# Patient Record
Sex: Male | Born: 1960 | Race: Black or African American | Hispanic: No | Marital: Married | Smoking: Never smoker
Health system: Southern US, Community
[De-identification: ages and names within clinical notes are randomized; demographics above are authoritative.]

## PROBLEM LIST (undated history)

## (undated) DIAGNOSIS — I10 Essential (primary) hypertension: Secondary | ICD-10-CM

## (undated) DIAGNOSIS — D72829 Elevated white blood cell count, unspecified: Secondary | ICD-10-CM

## (undated) DIAGNOSIS — I639 Cerebral infarction, unspecified: Secondary | ICD-10-CM

## (undated) DIAGNOSIS — I471 Supraventricular tachycardia, unspecified: Secondary | ICD-10-CM

## (undated) DIAGNOSIS — R7303 Prediabetes: Secondary | ICD-10-CM

## (undated) DIAGNOSIS — E785 Hyperlipidemia, unspecified: Secondary | ICD-10-CM

## (undated) DIAGNOSIS — Y249XXA Unspecified firearm discharge, undetermined intent, initial encounter: Secondary | ICD-10-CM

## (undated) DIAGNOSIS — G459 Transient cerebral ischemic attack, unspecified: Secondary | ICD-10-CM

## (undated) DIAGNOSIS — I619 Nontraumatic intracerebral hemorrhage, unspecified: Secondary | ICD-10-CM

## (undated) DIAGNOSIS — D649 Anemia, unspecified: Secondary | ICD-10-CM

## (undated) DIAGNOSIS — R519 Headache, unspecified: Secondary | ICD-10-CM

## (undated) DIAGNOSIS — M67439 Ganglion, unspecified wrist: Secondary | ICD-10-CM

## (undated) HISTORY — PX: JOINT REPLACEMENT: SHX530

## (undated) HISTORY — DX: Essential (primary) hypertension: I10

## (undated) HISTORY — PX: HERNIA REPAIR: SHX51

## (undated) HISTORY — DX: Cerebral infarction, unspecified: I63.9

## (undated) HISTORY — PX: OTHER SURGICAL HISTORY: SHX169

---

## 1994-06-13 ENCOUNTER — Emergency Department: Admit: 1994-06-13 | Payer: Self-pay | Admitting: Emergency Medical Services

## 2001-02-26 ENCOUNTER — Emergency Department (HOSPITAL_COMMUNITY): Admission: EM | Admit: 2001-02-26 | Discharge: 2001-02-26 | Payer: Self-pay | Admitting: *Deleted

## 2005-11-11 ENCOUNTER — Ambulatory Visit: Payer: Self-pay | Admitting: Family Medicine

## 2006-01-28 ENCOUNTER — Ambulatory Visit: Payer: Self-pay | Admitting: Family Medicine

## 2006-02-25 ENCOUNTER — Ambulatory Visit (HOSPITAL_COMMUNITY): Admission: RE | Admit: 2006-02-25 | Discharge: 2006-02-25 | Payer: Self-pay | Admitting: *Deleted

## 2015-10-09 ENCOUNTER — Ambulatory Visit (INDEPENDENT_AMBULATORY_CARE_PROVIDER_SITE_OTHER): Payer: Medicaid Other | Admitting: Podiatry

## 2015-10-09 ENCOUNTER — Encounter: Payer: Self-pay | Admitting: Podiatry

## 2015-10-09 VITALS — BP 142/102 | HR 82 | Resp 18

## 2015-10-09 DIAGNOSIS — L600 Ingrowing nail: Secondary | ICD-10-CM | POA: Diagnosis not present

## 2015-10-09 DIAGNOSIS — B351 Tinea unguium: Secondary | ICD-10-CM | POA: Diagnosis not present

## 2015-10-09 DIAGNOSIS — M79676 Pain in unspecified toe(s): Secondary | ICD-10-CM

## 2015-10-09 NOTE — Progress Notes (Signed)
   Subjective:    Patient ID: Sherley BoundsJoseph B Onstott, male    DOB: 01/17/1961, 55 y.o.   MRN: [id number]  HPI  55 year old male presents the office for concerns of thick, painful, elongated toenails that he cannot trim himself. Denies any swelling or redness or any drainage or pus. He is unsure of his last A1c or his last blood sugar. Denies any claudication symptoms. No numbness or tingling. No other complaints.   Review of Systems  All other systems reviewed and are negative.      Objective:   Physical Exam General: AAO x3, NAD  Dermatological: Nails are hypertrophic, dystrophic, brittle, discolored, elongated 10. There is no swelling erythema or drainage. There is incurvation of the nails. Tenderness to nails 1-5 bilaterally.  Vascular: Dorsalis Pedis artery and Posterior Tibial artery pedal pulses are 2/4 bilateral with immedate capillary fill time. Pedal hair growth present. No varicosities and no lower extremity edema present bilateral. There is no pain with calf compression, swelling, warmth, erythema.   Neruologic: Grossly intact via light touch bilateral. Vibratory intact via tuning fork bilateral. Protective threshold with Semmes Wienstein monofilament intact to all pedal sites bilateral. Patellar and Achilles deep tendon reflexes 2+ bilateral. No Babinski or clonus noted bilateral.   Musculoskeletal: Hammertoes are present. No pain, crepitus, or limitation noted with foot and ankle range of motion bilateral. Muscular strength 5/5 in all groups tested bilateral.  Gait: Unassisted, Nonantalgic.      Assessment & Plan:  55 year old male symptomatic onychomycosis -Treatment options discussed including all alternatives, risks, and complications -Etiology of symptoms were discussed -Nails debrided 10 without complications or bleeding -Discussed to the foot inspection -Follow-up in 3 months or sooner if any problems arise. In the meantime, encouraged to call the office with any  questions, concerns, change in symptoms.   Ovid CurdMatthew Wagoner, DPM

## 2016-01-13 ENCOUNTER — Ambulatory Visit: Payer: Medicaid Other | Admitting: Podiatry

## 2018-11-21 DIAGNOSIS — M1612 Unilateral primary osteoarthritis, left hip: Secondary | ICD-10-CM | POA: Insufficient documentation

## 2018-11-30 DIAGNOSIS — Z96642 Presence of left artificial hip joint: Secondary | ICD-10-CM | POA: Insufficient documentation

## 2020-02-02 ENCOUNTER — Observation Stay (HOSPITAL_COMMUNITY): Payer: Medicaid Other

## 2020-02-02 ENCOUNTER — Other Ambulatory Visit: Payer: Self-pay

## 2020-02-02 ENCOUNTER — Emergency Department (HOSPITAL_COMMUNITY): Payer: Medicaid Other

## 2020-02-02 ENCOUNTER — Encounter (HOSPITAL_COMMUNITY): Payer: Self-pay | Admitting: Emergency Medicine

## 2020-02-02 ENCOUNTER — Observation Stay (HOSPITAL_COMMUNITY)
Admission: EM | Admit: 2020-02-02 | Discharge: 2020-02-04 | Disposition: A | Discharge status: Home / Self Care | Payer: Medicaid Other | Attending: Internal Medicine | Admitting: Internal Medicine

## 2020-02-02 DIAGNOSIS — G459 Transient cerebral ischemic attack, unspecified: Secondary | ICD-10-CM

## 2020-02-02 DIAGNOSIS — R7303 Prediabetes: Secondary | ICD-10-CM | POA: Diagnosis not present

## 2020-02-02 DIAGNOSIS — N182 Chronic kidney disease, stage 2 (mild): Secondary | ICD-10-CM | POA: Insufficient documentation

## 2020-02-02 DIAGNOSIS — H5347 Heteronymous bilateral field defects: Secondary | ICD-10-CM

## 2020-02-02 DIAGNOSIS — Z7984 Long term (current) use of oral hypoglycemic drugs: Secondary | ICD-10-CM | POA: Insufficient documentation

## 2020-02-02 DIAGNOSIS — I471 Supraventricular tachycardia: Secondary | ICD-10-CM | POA: Insufficient documentation

## 2020-02-02 DIAGNOSIS — M719 Bursopathy, unspecified: Secondary | ICD-10-CM | POA: Insufficient documentation

## 2020-02-02 DIAGNOSIS — E119 Type 2 diabetes mellitus without complications: Secondary | ICD-10-CM | POA: Insufficient documentation

## 2020-02-02 DIAGNOSIS — I619 Nontraumatic intracerebral hemorrhage, unspecified: Secondary | ICD-10-CM | POA: Insufficient documentation

## 2020-02-02 DIAGNOSIS — Z20822 Contact with and (suspected) exposure to covid-19: Secondary | ICD-10-CM | POA: Diagnosis not present

## 2020-02-02 DIAGNOSIS — D649 Anemia, unspecified: Secondary | ICD-10-CM | POA: Insufficient documentation

## 2020-02-02 DIAGNOSIS — Z79899 Other long term (current) drug therapy: Secondary | ICD-10-CM | POA: Diagnosis not present

## 2020-02-02 DIAGNOSIS — I129 Hypertensive chronic kidney disease with stage 1 through stage 4 chronic kidney disease, or unspecified chronic kidney disease: Secondary | ICD-10-CM | POA: Insufficient documentation

## 2020-02-02 DIAGNOSIS — I639 Cerebral infarction, unspecified: Secondary | ICD-10-CM | POA: Diagnosis not present

## 2020-02-02 DIAGNOSIS — R531 Weakness: Secondary | ICD-10-CM | POA: Diagnosis present

## 2020-02-02 DIAGNOSIS — E785 Hyperlipidemia, unspecified: Secondary | ICD-10-CM

## 2020-02-02 LAB — I-STAT CHEM 8, ED
BUN: 19 mg/dL (ref 6–20)
Calcium, Ion: 1.04 mmol/L — ABNORMAL LOW (ref 1.15–1.40)
Chloride: 104 mmol/L (ref 98–111)
Creatinine, Ser: 1.3 mg/dL — ABNORMAL HIGH (ref 0.61–1.24)
Glucose, Bld: 129 mg/dL — ABNORMAL HIGH (ref 70–99)
HCT: 46 % (ref 39.0–52.0)
Hemoglobin: 15.6 g/dL (ref 13.0–17.0)
Potassium: 3 mmol/L — ABNORMAL LOW (ref 3.5–5.1)
Sodium: 140 mmol/L (ref 135–145)
TCO2: 22 mmol/L (ref 22–32)

## 2020-02-02 LAB — DIFFERENTIAL
Abs Immature Granulocytes: 0.03 10*3/uL (ref 0.00–0.07)
Basophils Absolute: 0 10*3/uL (ref 0.0–0.1)
Basophils Relative: 0 %
Eosinophils Absolute: 0.1 10*3/uL (ref 0.0–0.5)
Eosinophils Relative: 1 %
Immature Granulocytes: 0 %
Lymphocytes Relative: 36 %
Lymphs Abs: 3.6 10*3/uL (ref 0.7–4.0)
Monocytes Absolute: 0.7 10*3/uL (ref 0.1–1.0)
Monocytes Relative: 7 %
Neutro Abs: 5.6 10*3/uL (ref 1.7–7.7)
Neutrophils Relative %: 56 %

## 2020-02-02 LAB — COMPREHENSIVE METABOLIC PANEL
ALT: 20 U/L (ref 0–44)
AST: 22 U/L (ref 15–41)
Albumin: 4.1 g/dL (ref 3.5–5.0)
Alkaline Phosphatase: 70 U/L (ref 38–126)
Anion gap: 11 (ref 5–15)
BUN: 18 mg/dL (ref 6–20)
CO2: 24 mmol/L (ref 22–32)
Calcium: 9.2 mg/dL (ref 8.9–10.3)
Chloride: 103 mmol/L (ref 98–111)
Creatinine, Ser: 1.41 mg/dL — ABNORMAL HIGH (ref 0.61–1.24)
GFR calc Af Amer: >60 mL/min (ref >60)
GFR calc non Af Amer: 55 mL/min — ABNORMAL LOW (ref >60)
Glucose, Bld: 134 mg/dL — ABNORMAL HIGH (ref 70–99)
Potassium: 3.1 mmol/L — ABNORMAL LOW (ref 3.5–5.1)
Sodium: 138 mmol/L (ref 135–145)
Total Bilirubin: 0.5 mg/dL (ref 0.3–1.2)
Total Protein: 7.2 g/dL (ref 6.5–8.1)

## 2020-02-02 LAB — CBC
HCT: 45.9 % (ref 39.0–52.0)
Hemoglobin: 14.7 g/dL (ref 13.0–17.0)
MCH: 28.2 pg (ref 26.0–34.0)
MCHC: 32 g/dL (ref 30.0–36.0)
MCV: 87.9 fL (ref 80.0–100.0)
Platelets: 331 10*3/uL (ref 150–400)
RBC: 5.22 MIL/uL (ref 4.22–5.81)
RDW: 12.3 % (ref 11.5–15.5)
WBC: 10 10*3/uL (ref 4.0–10.5)
nRBC: 0 % (ref 0.0–0.2)

## 2020-02-02 LAB — GLUCOSE, CAPILLARY
Glucose-Capillary: 96 mg/dL (ref 70–99)
Glucose-Capillary: 99 mg/dL (ref 70–99)

## 2020-02-02 LAB — CBG MONITORING, ED: Glucose-Capillary: 107 mg/dL — ABNORMAL HIGH (ref 70–99)

## 2020-02-02 LAB — PROTIME-INR
INR: 1 (ref 0.8–1.2)
Prothrombin Time: 12.6 seconds (ref 11.4–15.2)

## 2020-02-02 LAB — APTT: aPTT: 26 seconds (ref 24–36)

## 2020-02-02 LAB — SARS CORONAVIRUS 2 BY RT PCR (HOSPITAL ORDER, PERFORMED IN ~~LOC~~ HOSPITAL LAB): SARS Coronavirus 2: NEGATIVE

## 2020-02-02 MED ORDER — STROKE: EARLY STAGES OF RECOVERY BOOK
Freq: Once | Status: AC
  Filled 2020-02-02: qty 1

## 2020-02-02 MED ORDER — AMLODIPINE BESYLATE 5 MG PO TABS: 10.0000 mg | ORAL_TABLET | Freq: Every day | ORAL | Status: DC

## 2020-02-02 MED ORDER — ACETAMINOPHEN 160 MG/5ML PO SOLN: 650.0000 mg | ORAL | Status: DC | PRN

## 2020-02-02 MED ORDER — ACETAMINOPHEN 650 MG RE SUPP: 650.0000 mg | RECTAL | Status: DC | PRN

## 2020-02-02 MED ORDER — HYDRALAZINE HCL 25 MG PO TABS
25.0000 mg | ORAL_TABLET | Freq: Four times a day (QID) | ORAL | Status: DC | PRN
  Filled 2020-02-02: qty 1

## 2020-02-02 MED ORDER — IOHEXOL 350 MG/ML SOLN
75.0000 mL | Freq: Once | INTRAVENOUS | Status: AC | PRN
  Administered 2020-02-02: 75 mL via INTRAVENOUS

## 2020-02-02 MED ORDER — FAMOTIDINE 20 MG PO TABS
20.0000 mg | ORAL_TABLET | Freq: Every day | ORAL | Status: DC
  Administered 2020-02-03 – 2020-02-04 (×2): 20 mg via ORAL
  Filled 2020-02-02 (×4): qty 1

## 2020-02-02 MED ORDER — INSULIN ASPART 100 UNIT/ML ~~LOC~~ SOLN: 0.0000 [IU] | Freq: Three times a day (TID) | SUBCUTANEOUS | Status: DC

## 2020-02-02 MED ORDER — AMLODIPINE BESYLATE 10 MG PO TABS
10.0000 mg | ORAL_TABLET | Freq: Every day | ORAL | Status: DC
  Administered 2020-02-03 – 2020-02-04 (×2): 10 mg via ORAL
  Filled 2020-02-02 (×2): qty 1

## 2020-02-02 MED ORDER — SODIUM CHLORIDE 0.9% FLUSH
3.0000 mL | Freq: Once | INTRAVENOUS | Status: AC
  Administered 2020-02-02: 3 mL via INTRAVENOUS

## 2020-02-02 MED ORDER — PANTOPRAZOLE SODIUM 40 MG PO TBEC
40.0000 mg | DELAYED_RELEASE_TABLET | Freq: Every day | ORAL | Status: DC
  Administered 2020-02-02 – 2020-02-04 (×3): 40 mg via ORAL
  Filled 2020-02-02 (×3): qty 1

## 2020-02-02 MED ORDER — ASPIRIN EC 81 MG PO TBEC
81.0000 mg | DELAYED_RELEASE_TABLET | Freq: Every day | ORAL | Status: DC
  Administered 2020-02-02 – 2020-02-04 (×3): 81 mg via ORAL
  Filled 2020-02-02 (×3): qty 1

## 2020-02-02 MED ORDER — HYDROCHLOROTHIAZIDE 25 MG PO TABS: 25.0000 mg | ORAL_TABLET | Freq: Every day | ORAL | Status: DC

## 2020-02-02 MED ORDER — SENNOSIDES-DOCUSATE SODIUM 8.6-50 MG PO TABS: 1.0000 | ORAL_TABLET | Freq: Every evening | ORAL | Status: DC | PRN

## 2020-02-02 MED ORDER — ATORVASTATIN CALCIUM 40 MG PO TABS
40.0000 mg | ORAL_TABLET | Freq: Every day | ORAL | Status: DC
  Administered 2020-02-02 – 2020-02-03 (×2): 40 mg via ORAL
  Filled 2020-02-02 (×2): qty 1

## 2020-02-02 MED ORDER — ACETAMINOPHEN 325 MG PO TABS
650.0000 mg | ORAL_TABLET | ORAL | Status: DC | PRN
  Administered 2020-02-03: 650 mg via ORAL
  Filled 2020-02-02: qty 2

## 2020-02-02 MED ORDER — LUBIPROSTONE 24 MCG PO CAPS: 24.0000 ug | ORAL_CAPSULE | Freq: Every day | ORAL | Status: DC

## 2020-02-02 MED ORDER — POTASSIUM CHLORIDE CRYS ER 20 MEQ PO TBCR
40.0000 meq | EXTENDED_RELEASE_TABLET | Freq: Once | ORAL | Status: AC
  Administered 2020-02-02: 40 meq via ORAL
  Filled 2020-02-02: qty 2

## 2020-02-02 MED ORDER — MINOXIDIL 2.5 MG PO TABS: 2.5000 mg | ORAL_TABLET | Freq: Every day | ORAL | Status: DC

## 2020-02-02 MED ORDER — ENOXAPARIN SODIUM 40 MG/0.4ML ~~LOC~~ SOLN
40.0000 mg | SUBCUTANEOUS | Status: DC
  Administered 2020-02-04: 40 mg via SUBCUTANEOUS
  Filled 2020-02-02 (×2): qty 0.4

## 2020-02-02 MED ORDER — CAPTOPRIL 100 MG PO TABS: 100.0000 mg | ORAL_TABLET | Freq: Two times a day (BID) | ORAL | Status: DC

## 2020-02-02 MED ORDER — LORAZEPAM 0.5 MG PO TABS: 0.5000 mg | ORAL_TABLET | Freq: Four times a day (QID) | ORAL | Status: DC | PRN

## 2020-02-02 NOTE — Progress Notes (Signed)
Telemetry reported heart rhythm 3rd degree compltete block, .34 sec PR interval, MD Aware, ekg ordered, MD to stop by to see patient

## 2020-02-02 NOTE — Code Documentation (Signed)
Patient presented  To ED via EMS because of L sided vision loss and left sided sensation loss.  Patient has a history of HTN, DM, and previous CVA.  It was reported that patient  Was at dog groomer and got confused.  His left arm was constricted and he was unable to track to left side.  Patient is AO x 4.  NIHSS score was 1 because of partial hemianopia on left side.  Patient is not a candidate for TPA and orders are for neuro checks q2h x 12 hr and then q4.
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
## 2020-02-02 NOTE — Progress Notes (Signed)
Nurse reported patient had third-degree AV block on monitor.  Went to examine patient, patient heart rate in the 60s, repeated EKG showed first-degree AV block same as the ED EKG.  Review patient's monitoring record, suspicous third-degree AV block only one brief episode, followed by significant background noise and baseline disturbance, and pt did not recall having any lightheadedness, palpitations or missing heartbeat.  We will continue monitor, if further episode(s) of high degree AV block shows up, will call cardiology.  Verapamil discontinued.

## 2020-02-02 NOTE — Procedures (Signed)
Came to ED for echo, but patient is being transported to another room at this time

## 2020-02-02 NOTE — ED Triage Notes (Signed)
Per EMS Pt was at the dog groomers when he became confused and began to have slurred speech, and L sided weakness.

## 2020-02-02 NOTE — H&P (Signed)
History and Physical    PHINNEAS SHAKOOR ZOX:[id number] DOB: [birth year] DOA: 02/02/2020  PCP: [facility] Personal (Confirm with patient/family/NH records and if not entered, this has to be entered at [facility] point of entry) Patient coming from: Home  I have personally briefly reviewed patient's old medical records in [facility]  Chief Complaint: Left sided weakness, slurred speech.  HPI: CLYDE ZARRELLA is a 59 y.o. male with medical history significant of refractory hypertension, IIDM, HLD, TIA and strokes, presented with new onset of left-sided weakness numbness and slurred speech and confusion.  Patient traveled from [place] to [place] yesterday.  Patient admitted that his blood pressure has been poorly controlled despite on multiple BP meds, systolic blood pressure is around 160s-170s and diastolic blood 110s are his baseline.  This morning, he took his BP meds in the morning and think started his breakfast.  Then suddenly, he felt left arm and 5 fingers numbness, family member found him confused and speech becomes slurred.  Symptoms lasted about 1 hour, a prolonged arrival in ED, patient felt the left arm weakness has improved but numbness persisted, he also reported his speech has returned to his baseline.  And confusion also subsided.  Denied any weakness or numbness of any other limbs, no blurry vision no hearing changes no headache. ED Course: Code stroke was called, CT head showed advanced medical vascular ischemia chronic, chronic infarct of the left basal ganglia and adjacent white matter. K 3.1, Cre 1.3 (Baseline 1.2 two years ago).  Review of Systems: As per HPI otherwise 14 point review of systems negative.    Past Medical History:  Diagnosis Date  . Hypertension   . Stroke [facility])     History reviewed. No pertinent surgical history.   reports that he has never smoked. He has never used smokeless tobacco. He reports that he does not drink alcohol  and does not use drugs.  Allergies  Allergen Reactions  . Beta Adrenergic Blockers     History reviewed. No pertinent family history.  Prior to Admission medications   Medication Sig Start Date End Date Taking? Authorizing Provider  amLODipine (NORVASC) 10 MG tablet Take by mouth.    [provider]  atorvastatin (LIPITOR) 40 MG tablet Take by mouth.    [provider]  captopril (CAPOTEN) 100 MG tablet Take by mouth.    [provider]  hydrochlorothiazide (HYDRODIURIL) 25 MG tablet Take by mouth. 11/15/11   [provider]  lubiprostone (AMITIZA) 24 MCG capsule Take by mouth.    [provider]  metFORMIN (GLUCOPHAGE) 500 MG tablet Take by mouth.    [provider]  minoxidil (LONITEN) 2.5 MG tablet Take by mouth. 05/16/12   [provider]  omeprazole (PRILOSEC) 20 MG capsule Take by mouth.    [provider]  ranitidine (ZANTAC) 150 MG capsule Take by mouth.    [provider]    Physical Exam: Vitals:   02/02/20 1155 02/02/20 1200 02/02/20 1203 02/02/20 1246  BP:   135/85   Pulse: 71     Resp: 18  18   Temp: 98.3 F (36.8 C) 98.6 F (37 C) 98.6 F (37 C)   TempSrc:   Oral   SpO2:   95%   Weight:    104.3 kg    Constitutional: NAD, calm, comfortable Vitals:   02/02/20 1155 02/02/20 1200 02/02/20 1203 02/02/20 1246  BP:   135/85   Pulse: 71  Resp: 18  18   Temp: 98.3 F (36.8 C) 98.6 F (37 C) 98.6 F (37 C)   TempSrc:   Oral   SpO2:   95%   Weight:    104.3 kg   Eyes: PERRL, lids and conjunctivae normal ENMT: Mucous membranes are moist. Posterior pharynx clear of any exudate or lesions.Normal dentition.  Neck: normal, supple, no masses, no thyromegaly Respiratory: clear to auscultation bilaterally, no wheezing, no crackles. Normal respiratory effort. No accessory muscle use.  Cardiovascular: Regular rate and rhythm, no murmurs / rubs / gallops. No extremity edema. 2+ pedal  pulses. No carotid bruits.  Abdomen: no tenderness, no masses palpated. No hepatosplenomegaly. Bowel sounds positive.  Musculoskeletal: no clubbing / cyanosis. No joint deformity upper and lower extremities. Good ROM, no contractures. Normal muscle tone.  Skin: no rashes, lesions, ulcers. No induration Neurologic: CN 2-12 grossly intact. DTR normal. Strength 5/5 in all 4.  Decreased light touch sensation on the left arm compared to right.  Speech is somewhat slurred with slowed down articulation Psychiatric: Normal judgment and insight. Alert and oriented x 3. Normal mood.    Labs on Admission: I have personally reviewed following labs and imaging studies  CBC: Recent Labs  Lab 02/02/20 1127 02/02/20 1131  WBC 10.0  --   NEUTROABS 5.6  --   HGB 14.7 15.6  HCT 45.9 46.0  MCV 87.9  --   PLT 331  --    Basic Metabolic Panel: Recent Labs  Lab 02/02/20 1127 02/02/20 1131  NA 138 140  K 3.1* 3.0*  CL 103 104  CO2 24  --   GLUCOSE 134* 129*  BUN 18 19  CREATININE 1.41* 1.30*  CALCIUM 9.2  --    GFR: CrCl cannot be calculated (Unknown ideal weight.). Liver Function Tests: Recent Labs  Lab 02/02/20 1127  AST 22  ALT 20  ALKPHOS 70  BILITOT 0.5  PROT 7.2  ALBUMIN 4.1   No results for input(s): LIPASE, AMYLASE in the last 168 hours. No results for input(s): AMMONIA in the last 168 hours. Coagulation Profile: Recent Labs  Lab 02/02/20 1127  INR 1.0   Cardiac Enzymes: No results for input(s): CKTOTAL, CKMB, CKMBINDEX, TROPONINI in the last 168 hours. BNP (last 3 results) No results for input(s): PROBNP in the last 8760 hours. HbA1C: No results for input(s): HGBA1C in the last 72 hours. CBG: Recent Labs  Lab 02/02/20 1223  GLUCAP 107*   Lipid Profile: No results for input(s): CHOL, HDL, LDLCALC, TRIG, CHOLHDL, LDLDIRECT in the last 72 hours. Thyroid Function Tests: No results for input(s): TSH, T4TOTAL, FREET4, T3FREE, THYROIDAB in the last 72 hours. Anemia  Panel: No results for input(s): VITAMINB12, FOLATE, FERRITIN, TIBC, IRON, RETICCTPCT in the last 72 hours. Urine analysis: No results found for: COLORURINE, APPEARANCEUR, LABSPEC, PHURINE, GLUCOSEU, HGBUR, BILIRUBINUR, KETONESUR, PROTEINUR, UROBILINOGEN, NITRITE, LEUKOCYTESUR  Radiological Exams on Admission: CT HEAD CODE STROKE WO CONTRAST  Result Date: 02/02/2020 CLINICAL DATA:  Code stroke.  Left-sided weakness EXAM: CT HEAD WITHOUT CONTRAST TECHNIQUE: Contiguous axial images were obtained from the base of the skull through the vertex without intravenous contrast. COMPARISON:  Outside study 2017 FINDINGS: Brain: No acute intracranial hemorrhage mass effect, or edema. No new loss of gray-white differentiation. There is a chronic infarct of the left basal ganglia and adjacent white matter. Patchy and confluent areas of hypoattenuation in the supratentorial white matter nonspecific but may reflect advanced chronic microvascular ischemic changes greater than expected for age. No  extra-axial fluid collection. Vascular: No hyperdense vessel. Skull: Unremarkable. Sinuses/Orbits: Aerated.  Orbits are unremarkable. Other: Mastoid air cells are clear. ASPECTS (Alberta Stroke Program Early CT Score) - Ganglionic level infarction (caudate, lentiform nuclei, internal capsule, insula, M1-M3 cortex): 7 - Supraganglionic infarction (M4-M6 cortex): 3 Total score (0-10 with 10 being normal): 10 IMPRESSION: No acute intracranial hemorrhage or evidence of acute infarction. ASPECT score is 10. Advanced chronic microvascular ischemic changes greater than expected for age. 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 Initial results were communicated to Dr. Otelia LimesLindzen at 11:38 am on 02/02/2020 by text page via the [facility] messaging system. Electronically Signed   By: Guadlupe SpanishPraneil  Patel M.D.   On: 02/02/2020 11:48   CT ANGIO HEAD CODE STROKE  Result Date: 02/02/2020 CLINICAL DATA:  Left-sided weakness EXAM: CT  ANGIOGRAPHY HEAD AND NECK TECHNIQUE: Multidetector CT imaging of the head and neck was performed using the standard protocol during bolus administration of intravenous contrast. Multiplanar CT image reconstructions and MIPs were obtained to evaluate the vascular anatomy. Carotid stenosis measurements (when applicable) are obtained utilizing NASCET criteria, using the distal internal carotid diameter as the denominator. CONTRAST:  75mL OMNIPAQUE IOHEXOL 350 MG/ML SOLN COMPARISON:  None. FINDINGS: CTA NECK Aortic arch: Great vessel origins are patent. Right carotid system: Patent. No measurable stenosis at the ICA origin. Left carotid system: Patent. No measurable stenosis at the ICA origin. Vertebral arteries: Patent and codominant.  No measurable stenosis. Skeleton: Mild cervical spine degenerative changes. Other neck: No mass or adenopathy. Upper chest: Cardiomegaly.  No apical lung mass. Review of the MIP images confirms the above findings CTA HEAD Anterior circulation: Intracranial internal carotid arteries are patent. There is calcified plaque along the distal supraclinoid right ICA causing less than 50% stenosis. Anterior and middle cerebral arteries are patent. There is short segment high-grade stenosis of the proximal left M1 MCA. Posterior circulation: Intracranial vertebral arteries are patent. Mixed plaque on the left causes up to moderate stenosis. PICA origins are patent. Basilar artery is patent. Posterior cerebral arteries are patent. Bilateral posterior communicating arteries are present and there is fetal origin of the left posterior cerebral artery. There is segmental high-grade stenosis of the proximal right P2 PCA. Venous sinuses: Patent as allowed by contrast bolus timing. Review of the MIP images confirms the above findings IMPRESSION: No large vessel occlusion. No hemodynamically significant stenosis in the neck. Segmental high-grade stenosis of the proximal left M1 MCA and proximal right P2  PCA. Electronically Signed   By: Guadlupe SpanishPraneil  Patel M.D.   On: 02/02/2020 11:58   CT ANGIO NECK CODE STROKE  Result Date: 02/02/2020 CLINICAL DATA:  Left-sided weakness EXAM: CT ANGIOGRAPHY HEAD AND NECK TECHNIQUE: Multidetector CT imaging of the head and neck was performed using the standard protocol during bolus administration of intravenous contrast. Multiplanar CT image reconstructions and MIPs were obtained to evaluate the vascular anatomy. Carotid stenosis measurements (when applicable) are obtained utilizing NASCET criteria, using the distal internal carotid diameter as the denominator. CONTRAST:  75mL OMNIPAQUE IOHEXOL 350 MG/ML SOLN COMPARISON:  None. FINDINGS: CTA NECK Aortic arch: Great vessel origins are patent. Right carotid system: Patent. No measurable stenosis at the ICA origin. Left carotid system: Patent. No measurable stenosis at the ICA origin. Vertebral arteries: Patent and codominant.  No measurable stenosis. Skeleton: Mild cervical spine degenerative changes. Other neck: No mass or adenopathy. Upper chest: Cardiomegaly.  No apical lung mass. Review of the MIP images confirms the above findings CTA HEAD  Anterior circulation: Intracranial internal carotid arteries are patent. There is calcified plaque along the distal supraclinoid right ICA causing less than 50% stenosis. Anterior and middle cerebral arteries are patent. There is short segment high-grade stenosis of the proximal left M1 MCA. Posterior circulation: Intracranial vertebral arteries are patent. Mixed plaque on the left causes up to moderate stenosis. PICA origins are patent. Basilar artery is patent. Posterior cerebral arteries are patent. Bilateral posterior communicating arteries are present and there is fetal origin of the left posterior cerebral artery. There is segmental high-grade stenosis of the proximal right P2 PCA. Venous sinuses: Patent as allowed by contrast bolus timing. Review of the MIP images confirms the above  findings IMPRESSION: No large vessel occlusion. No hemodynamically significant stenosis in the neck. 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 Electronically Signed   By: Guadlupe Spanish M.D.   On: 02/02/2020 11:58    EKG: Independently reviewed. 1st degree AV block, poor R progression.  Assessment/Plan Active Problems:   TIA (transient ischemic attack)  (please populate well all problems here in Problem List. (For example, if patient is on BP meds at home and you resume or decide to hold them, it is a problem that needs to be her. Same for CAD, COPD, HLD and so on)  TIA -With new onset of left arm paresis and paresthesia, dysphagia, probably 2/2 uncontrolled HTN. -ABCD2=4, to the stroke risks 4.1% -Start aspirin, continue statin, check A1c and lipid panel -Blood pressure persistently high despite already taking today's BP meds, start as needed hydralazine. -CTA Segmental high-grade stenosis of the proximal left M1 MCA and proximal right P2 PCA., MRI ordered, echo ordered -PT OT evaluation -Expect overnight observation discharge in the morning after stroke study done  HTN -As above, restart HCTZ, ACEI, Minoxidil  IIDM -Hold Metformin for 72 hours  HLD -Statin and recheck Lipid panel  GERD -PPI  CKD stage I -Cre level stable, Euvolumic  DVT prophylaxis: Lovenox Code Status: Full Family Communication: Daughter at bedside Disposition Plan: Expect discharge in AM Consults called: *Neuro Admission status: Tele Obs   Emeline General MD [facility] Pager [phone]  02/02/2020, 1:24 PM

## 2020-02-02 NOTE — ED Provider Notes (Signed)
[facility] EMERGENCY DEPARTMENT Provider Note   CSN: [id number] Arrival date & time: 02/02/20  1120     History No chief complaint on file.   JOHNTAVIOUS FRANCOM is a 59 y.o. male.  HPI 41 caveat 59 year old male presents today via EMS as code stroke.  They report that patient was at a dog grooming place where he was reported to have normal neurological status until 10:20 AM.  At that time he became weak on the left side and started slurring his words and less responsive.  On EMS arrival he was awake but had some dysarthria.  They report normal blood sugar and mild hypertension.  Review of records from [facility] reveal previous right sided deficits from basal ganglia hemorrhage on 11/02/2010 12:35 PM Patient states he was at groomer and noted left hand numbness and left mouth drooling.  Symptoms now resolved.     Past Medical History:  Diagnosis Date  . Hypertension   . Stroke [facility])     There are no problems to display for this patient.   History reviewed. No pertinent surgical history.     No family history on file.  Social History   Tobacco Use  . Smoking status: Never Smoker  . Smokeless tobacco: Never Used  Substance Use Topics  . Alcohol use: No    Alcohol/week: 0.0 standard drinks  . Drug use: No    Home Medications Prior to Admission medications   Medication Sig Start Date End Date Taking? Authorizing Provider  amLODipine (NORVASC) 10 MG tablet Take by mouth.    [provider]  atorvastatin (LIPITOR) 40 MG tablet Take by mouth.    [provider]  captopril (CAPOTEN) 100 MG tablet Take by mouth.    [provider]  hydrochlorothiazide (HYDRODIURIL) 25 MG tablet Take by mouth. 11/15/11   [provider]  lubiprostone (AMITIZA) 24 MCG capsule Take by mouth.    [provider]  metFORMIN (GLUCOPHAGE) 500 MG tablet Take by mouth.    [provider]  minoxidil (LONITEN) 2.5 MG tablet Take  by mouth. 05/16/12   [provider]  omeprazole (PRILOSEC) 20 MG capsule Take by mouth.    [provider]  ranitidine (ZANTAC) 150 MG capsule Take by mouth.    [provider]    Allergies    Beta adrenergic blockers  Review of Systems   Review of Systems  Unable to perform ROS: Acuity of condition    Physical Exam Updated Vital Signs There were no vitals taken for this visit.  Physical Exam Vitals and nursing note reviewed.  Constitutional:      General: He is not in acute distress.    Appearance: Normal appearance.  HENT:     Head: Normocephalic and atraumatic.     Right Ear: External ear normal.     Left Ear: External ear normal.     Nose: Nose normal.     Mouth/Throat:     Mouth: Mucous membranes are moist.  Eyes:     Extraocular Movements: Extraocular movements intact.     Pupils: Pupils are equal, round, and reactive to light.     Comments: Left-sided visual field deficit  Musculoskeletal:     Cervical back: Normal range of motion.  Neurological:     Mental Status: He is alert.     ED Results / Procedures / Treatments   Labs (all labs ordered are listed, but only abnormal results are displayed) Labs Reviewed  PROTIME-INR  APTT  CBC  DIFFERENTIAL  COMPREHENSIVE METABOLIC PANEL  I-STAT CHEM 8, ED  CBG MONITORING, ED    EKG EKG Interpretation  Date/Time:  Saturday February 02 2020 12:16:43 EDT Ventricular Rate:  68 PR Interval:    QRS Duration: 96 QT Interval:  417 QTC Calculation: 444 R Axis:   -43 Text Interpretation: Sinus rhythm Prolonged PR interval Left axis deviation Abnormal R-wave progression, late transition Borderline T abnormalities, inferior leads No old tracing to compare Confirmed by Margarita Grizzle [phone]) on 02/02/2020 12:57:55 PM   Radiology CT HEAD CODE STROKE WO CONTRAST  Result Date: 02/02/2020 CLINICAL DATA:  Code stroke.  Left-sided weakness EXAM: CT HEAD WITHOUT CONTRAST TECHNIQUE: Contiguous axial  images were obtained from the base of the skull through the vertex without intravenous contrast. COMPARISON:  Outside study 2017 FINDINGS: Brain: No acute intracranial hemorrhage mass effect, or edema. No new loss of gray-white differentiation. There is a chronic infarct of the left basal ganglia and adjacent white matter. Patchy and confluent areas of hypoattenuation in the supratentorial white matter nonspecific but may reflect advanced chronic microvascular ischemic changes greater than expected for age. No extra-axial fluid collection. Vascular: No hyperdense vessel. Skull: Unremarkable. Sinuses/Orbits: Aerated.  Orbits are unremarkable. Other: Mastoid air cells are clear. ASPECTS (Alberta Stroke Program Early CT Score) - Ganglionic level infarction (caudate, lentiform nuclei, internal capsule, insula, M1-M3 cortex): 7 - Supraganglionic infarction (M4-M6 cortex): 3 Total score (0-10 with 10 being normal): 10 IMPRESSION: No acute intracranial hemorrhage or evidence of acute infarction. ASPECT score is 10. Advanced chronic microvascular ischemic changes greater than expected for age. 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 Initial results were communicated to Dr. Otelia Limes at 11:38 am on 02/02/2020 by text page via the [facility] messaging system. Electronically Signed   By: Guadlupe Spanish M.D.   On: 02/02/2020 11:48   CT ANGIO HEAD CODE STROKE  Result Date: 02/02/2020 CLINICAL DATA:  Left-sided weakness EXAM: CT ANGIOGRAPHY HEAD AND NECK TECHNIQUE: Multidetector CT imaging of the head and neck was performed using the standard protocol during bolus administration of intravenous contrast. Multiplanar CT image reconstructions and MIPs were obtained to evaluate the vascular anatomy. Carotid stenosis measurements (when applicable) are obtained utilizing NASCET criteria, using the distal internal carotid diameter as the denominator. CONTRAST:  60mL OMNIPAQUE IOHEXOL 350 MG/ML SOLN COMPARISON:   None. FINDINGS: CTA NECK Aortic arch: Great vessel origins are patent. Right carotid system: Patent. No measurable stenosis at the ICA origin. Left carotid system: Patent. No measurable stenosis at the ICA origin. Vertebral arteries: Patent and codominant.  No measurable stenosis. Skeleton: Mild cervical spine degenerative changes. Other neck: No mass or adenopathy. Upper chest: Cardiomegaly.  No apical lung mass. Review of the MIP images confirms the above findings CTA HEAD Anterior circulation: Intracranial internal carotid arteries are patent. There is calcified plaque along the distal supraclinoid right ICA causing less than 50% stenosis. Anterior and middle cerebral arteries are patent. There is short segment high-grade stenosis of the proximal left M1 MCA. Posterior circulation: Intracranial vertebral arteries are patent. Mixed plaque on the left causes up to moderate stenosis. PICA origins are patent. Basilar artery is patent. Posterior cerebral arteries are patent. Bilateral posterior communicating arteries are present and there is fetal origin of the left posterior cerebral artery. There is segmental high-grade stenosis of the proximal right P2 PCA. Venous sinuses: Patent as allowed by contrast bolus timing. Review of the MIP images confirms the above findings  IMPRESSION: No large vessel occlusion. No hemodynamically significant stenosis in the neck. 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 Electronically Signed   By: Guadlupe Spanish M.D.   On: 02/02/2020 11:58   CT ANGIO NECK CODE STROKE  Result Date: 02/02/2020 CLINICAL DATA:  Left-sided weakness EXAM: CT ANGIOGRAPHY HEAD AND NECK TECHNIQUE: Multidetector CT imaging of the head and neck was performed using the standard protocol during bolus administration of intravenous contrast. Multiplanar CT image reconstructions and MIPs were obtained to evaluate the vascular anatomy. Carotid stenosis measurements (when  applicable) are obtained utilizing NASCET criteria, using the distal internal carotid diameter as the denominator. CONTRAST:  82mL OMNIPAQUE IOHEXOL 350 MG/ML SOLN COMPARISON:  None. FINDINGS: CTA NECK Aortic arch: Great vessel origins are patent. Right carotid system: Patent. No measurable stenosis at the ICA origin. Left carotid system: Patent. No measurable stenosis at the ICA origin. Vertebral arteries: Patent and codominant.  No measurable stenosis. Skeleton: Mild cervical spine degenerative changes. Other neck: No mass or adenopathy. Upper chest: Cardiomegaly.  No apical lung mass. Review of the MIP images confirms the above findings CTA HEAD Anterior circulation: Intracranial internal carotid arteries are patent. There is calcified plaque along the distal supraclinoid right ICA causing less than 50% stenosis. Anterior and middle cerebral arteries are patent. There is short segment high-grade stenosis of the proximal left M1 MCA. Posterior circulation: Intracranial vertebral arteries are patent. Mixed plaque on the left causes up to moderate stenosis. PICA origins are patent. Basilar artery is patent. Posterior cerebral arteries are patent. Bilateral posterior communicating arteries are present and there is fetal origin of the left posterior cerebral artery. There is segmental high-grade stenosis of the proximal right P2 PCA. Venous sinuses: Patent as allowed by contrast bolus timing. Review of the MIP images confirms the above findings IMPRESSION: No large vessel occlusion. No hemodynamically significant stenosis in the neck. 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 Electronically Signed   By: Guadlupe Spanish M.D.   On: 02/02/2020 11:58    Procedures Procedures (including critical care time)  Medications Ordered in ED Medications  sodium chloride flush (NS) 0.9 % injection 3 mL (has no administration in time range)    ED Course  I have reviewed the triage  vital signs and the nursing notes.  Pertinent labs & imaging results that were available during my care of the patient were reviewed by me and considered in my medical decision making (see chart for details).    MDM Rules/Calculators/A&P                          59 yo male ho ich presents today with acute neuro deficits of left hand numbness, left visual field cut,symptoms resolving here in ED. Neuro saw and evaluated-not given tpa- symptoms currently resolved. Patient to be admitted for full stroke work up. Discussed with Dr. Chipper Herb who will see for admission Final Clinical Impression(s) / ED Diagnoses Final diagnoses:  Cerebrovascular accident (CVA), unspecified mechanism [facility])    Rx / DC Orders ED Discharge Orders    None       Margarita Grizzle, MD 02/02/20 1302

## 2020-02-03 ENCOUNTER — Observation Stay (HOSPITAL_BASED_OUTPATIENT_CLINIC_OR_DEPARTMENT_OTHER): Payer: Medicaid Other

## 2020-02-03 DIAGNOSIS — G458 Other transient cerebral ischemic attacks and related syndromes: Secondary | ICD-10-CM

## 2020-02-03 DIAGNOSIS — G459 Transient cerebral ischemic attack, unspecified: Secondary | ICD-10-CM | POA: Diagnosis not present

## 2020-02-03 DIAGNOSIS — R7303 Prediabetes: Secondary | ICD-10-CM | POA: Diagnosis not present

## 2020-02-03 LAB — GLUCOSE, CAPILLARY
Glucose-Capillary: 91 mg/dL (ref 70–99)
Glucose-Capillary: 119 mg/dL — ABNORMAL HIGH (ref 70–99)
Glucose-Capillary: 80 mg/dL (ref 70–99)
Glucose-Capillary: 109 mg/dL — ABNORMAL HIGH (ref 70–99)

## 2020-02-03 LAB — MAGNESIUM: Magnesium: 1.8 mg/dL (ref 1.7–2.4)

## 2020-02-03 LAB — HIV ANTIBODY (ROUTINE TESTING W REFLEX): HIV Screen 4th Generation wRfx: NONREACTIVE

## 2020-02-03 LAB — BASIC METABOLIC PANEL
Anion gap: 10 (ref 5–15)
BUN: 18 mg/dL (ref 6–20)
CO2: 26 mmol/L (ref 22–32)
Calcium: 9.1 mg/dL (ref 8.9–10.3)
Chloride: 103 mmol/L (ref 98–111)
Creatinine, Ser: 1.22 mg/dL (ref 0.61–1.24)
GFR calc Af Amer: >60 mL/min (ref >60)
GFR calc non Af Amer: >60 mL/min (ref >60)
Glucose, Bld: 99 mg/dL (ref 70–99)
Potassium: 3.5 mmol/L (ref 3.5–5.1)
Sodium: 139 mmol/L (ref 135–145)

## 2020-02-03 LAB — LIPID PANEL
Cholesterol: 185 mg/dL (ref 0–200)
HDL: 38 mg/dL — ABNORMAL LOW (ref >40)
LDL Cholesterol: 120 mg/dL — ABNORMAL HIGH (ref 0–99)
Total CHOL/HDL Ratio: 4.9 RATIO
Triglycerides: 135 mg/dL (ref <150)
VLDL: 27 mg/dL (ref 0–40)

## 2020-02-03 LAB — CBC WITH DIFFERENTIAL/PLATELET
Abs Immature Granulocytes: 0.03 10*3/uL (ref 0.00–0.07)
Basophils Absolute: 0 10*3/uL (ref 0.0–0.1)
Basophils Relative: 0 %
Eosinophils Absolute: 0.1 10*3/uL (ref 0.0–0.5)
Eosinophils Relative: 1 %
HCT: 43.1 % (ref 39.0–52.0)
Hemoglobin: 14 g/dL (ref 13.0–17.0)
Immature Granulocytes: 0 %
Lymphocytes Relative: 29 %
Lymphs Abs: 2.7 10*3/uL (ref 0.7–4.0)
MCH: 28.3 pg (ref 26.0–34.0)
MCHC: 32.5 g/dL (ref 30.0–36.0)
MCV: 87.1 fL (ref 80.0–100.0)
Monocytes Absolute: 0.7 10*3/uL (ref 0.1–1.0)
Monocytes Relative: 8 %
Neutro Abs: 5.6 10*3/uL (ref 1.7–7.7)
Neutrophils Relative %: 62 %
Platelets: 315 10*3/uL (ref 150–400)
RBC: 4.95 MIL/uL (ref 4.22–5.81)
RDW: 12.7 % (ref 11.5–15.5)
WBC: 9.2 10*3/uL (ref 4.0–10.5)
nRBC: 0 % (ref 0.0–0.2)

## 2020-02-03 LAB — ECHOCARDIOGRAM COMPLETE
Area-P 1/2: 2.5 cm2
Height: 65 in
S' Lateral: 2.7 cm
Single Plane A4C EF: 47.8 %
Weight: 3680 oz

## 2020-02-03 LAB — HEMOGLOBIN A1C
Hgb A1c MFr Bld: 6 % — ABNORMAL HIGH (ref 4.8–5.6)
Mean Plasma Glucose: 125.5 mg/dL

## 2020-02-03 MED ORDER — CLOPIDOGREL BISULFATE 75 MG PO TABS
75.0000 mg | ORAL_TABLET | Freq: Every day | ORAL | Status: DC
  Administered 2020-02-03 – 2020-02-04 (×2): 75 mg via ORAL
  Filled 2020-02-03 (×2): qty 1

## 2020-02-03 MED ORDER — HYDROCHLOROTHIAZIDE 25 MG PO TABS
25.0000 mg | ORAL_TABLET | Freq: Every day | ORAL | Status: DC
  Administered 2020-02-03 – 2020-02-04 (×2): 25 mg via ORAL
  Filled 2020-02-03 (×2): qty 1

## 2020-02-03 MED ORDER — CLOPIDOGREL BISULFATE 75 MG PO TABS
75.0000 mg | ORAL_TABLET | Freq: Every day | ORAL | 0 refills | Status: DC
Start: 2020-02-03 — End: 2020-03-12

## 2020-02-03 MED ORDER — HYDRALAZINE HCL 50 MG PO TABS
50.0000 mg | ORAL_TABLET | Freq: Four times a day (QID) | ORAL | Status: DC | PRN
  Administered 2020-02-03 – 2020-02-04 (×2): 50 mg via ORAL
  Filled 2020-02-03 (×2): qty 1

## 2020-02-03 MED ORDER — HYDRALAZINE HCL 20 MG/ML IJ SOLN
10.0000 mg | Freq: Once | INTRAMUSCULAR | Status: AC
  Administered 2020-02-03: 10 mg via INTRAVENOUS
  Filled 2020-02-03: qty 1

## 2020-02-03 MED ORDER — AMLODIPINE BESYLATE 10 MG PO TABS
10.0000 mg | ORAL_TABLET | Freq: Every day | ORAL | 0 refills | Status: DC
Start: 2020-02-04 — End: 2020-03-25

## 2020-02-03 MED ORDER — ATORVASTATIN CALCIUM 80 MG PO TABS: 80.0000 mg | ORAL_TABLET | Freq: Every day | ORAL | 0 refills | Status: DC

## 2020-02-03 MED ORDER — ATORVASTATIN CALCIUM 80 MG PO TABS
80.0000 mg | ORAL_TABLET | Freq: Every day | ORAL | Status: DC
  Administered 2020-02-04: 80 mg via ORAL
  Filled 2020-02-03 (×2): qty 1

## 2020-02-03 MED ORDER — TERAZOSIN HCL 5 MG PO CAPS
5.0000 mg | ORAL_CAPSULE | Freq: Every day | ORAL | Status: DC
  Administered 2020-02-03: 5 mg via ORAL
  Filled 2020-02-03 (×2): qty 1

## 2020-02-03 MED ORDER — ASPIRIN 81 MG PO TBEC
81.0000 mg | DELAYED_RELEASE_TABLET | Freq: Every day | ORAL | 11 refills | Status: DC
Start: 2020-02-04 — End: 2020-09-11

## 2020-02-03 NOTE — Progress Notes (Signed)
  Echocardiogram 2D Echocardiogram has been performed.  Delcie Roch 02/03/2020, 9:31 AM

## 2020-02-03 NOTE — Progress Notes (Addendum)
SLP Cancellation Note  Patient Details Name: Travis Chavez MRN: [id number] DOB: 01-27-1961   Cancelled treatment:       Reason Eval/Treat Not Completed: Patient at procedure or test/unavailable  ST will continue efforts to complete cognitive/linguistic evaluation.  ADDENDUM:  Discussion with MD.  Patient MRI is negative and symptoms have resolved.  Given this cognitive/linguistic evaluation will not be completed.  If ST can be of further assistance please feel free to reconsult.  Thank you.   Dimas Aguas, MA, CCC-SLP Acute Rehab SLP [phone]

## 2020-02-03 NOTE — Progress Notes (Signed)
Occupational Therapy Evaluation Patient Details Name: Travis Chavez MRN: [id number] DOB: 1960/12/25 Today's Date: 02/03/2020    History of Present Illness 59 y.o. male with medical history significant of refractory hypertension, IIDM, HLD, TIA and strokes, presented with new onset of left-sided weakness numbness and slurred speech and confusionCT head showed advanced medical vascular ischemia chronic, chronic infarct of the left basal ganglia and adjacent white matter. Residual weakness R side from prior CVA in 2012. s/p  L THR 2020.   Clinical Impression   Pt has residual weakness from prior CVA. Pt feels he is back to baseline. Pt overall modified independent with ADL and mobility and safe to mobilize in room. Educated pt on signs/symptoms of CVA using [employer]. Pt frustrated with lack of functional use of R hand - given theraputty HEP. No further OT needs.    Follow Up Recommendations  No OT follow up    Equipment Recommendations  None recommended by OT    Recommendations for Other Services       Precautions / Restrictions Precautions Precautions: None      Mobility Bed Mobility Overal bed mobility: Modified Independent                Transfers Overall transfer level: Modified independent                    Balance Overall balance assessment: Mild deficits observed, not formally tested (from previous CVA; able to retrieve items from floor )                                         ADL either performed or assessed with clinical judgement   ADL Overall ADL's : At baseline                                             Vision Baseline Vision/History: No visual deficits Vision Assessment?: No apparent visual deficits     Perception Perception Perception Tested?: Yes Comments: WFL   Praxis Praxis Praxis-Other Comments: WFL    Pertinent Vitals/Pain       Hand Dominance Right (now L handed since CVA)    Extremity/Trunk Assessment Upper Extremity Assessment Upper Extremity Assessment: RUE deficits/detail RUE Deficits / Details: residual weakness form prior CVA; uses as functional assist RUE Coordination: decreased fine motor;decreased gross motor   Lower Extremity Assessment Lower Extremity Assessment: RLE deficits/detail (residual deficits from prior CVA)   Cervical / Trunk Assessment Cervical / Trunk Assessment: Normal   Communication Communication Communication:  (speech at baseline)   Cognition Arousal/Alertness: Awake/alert Behavior During Therapy: WFL for tasks assessed/performed Overall Cognitive Status: Within Functional Limits for tasks assessed                                     General Comments  Served in [place] and had PTSD; better since having CVA    Exercises Exercises: Other exercises Other Exercises Other Exercises: gave theraputty ex   Shoulder Instructions      Home Living Family/patient expects to be discharged to:: Private residence Living Arrangements: Spouse/significant other Available Help at Discharge: Family;Available 24 hours/day (9 kids) Type of Home: House Home Access: Stairs to enter [employer]  of Steps: 7 Entrance Stairs-Rails: Right;Left;Can reach both Home Layout: Two level;Able to live on main level with bedroom/bathroom     Bathroom Shower/Tub: Tub/shower unit;Curtain   Bathroom Toilet: Handicapped height Bathroom Accessibility: Yes How Accessible: Accessible via walker Home Equipment: Walker - 2 wheels;Wheelchair - manual;Bedside commode;Cane - single point          Prior Functioning/Environment Level of Independence: Independent        Comments: Independent with ADL and IADL        OT Problem List: Decreased coordination;Impaired UE functional use;Obesity      OT Treatment/Interventions:      OT Goals(Current goals can be found in the care plan section) Acute Rehab OT Goals Patient  Stated Goal: to have better use of his R hand OT Goal Formulation: All assessment and education complete, DC therapy  OT Frequency:     Barriers to D/C:            Co-evaluation              AM-PAC OT "6 Clicks" Daily Activity     Outcome Measure Help from another person eating meals?: None Help from another person taking care of personal grooming?: None Help from another person toileting, which includes using toliet, bedpan, or urinal?: None Help from another person bathing (including washing, rinsing, drying)?: None Help from another person to put on and taking off regular upper body clothing?: None Help from another person to put on and taking off regular lower body clothing?: None 6 Click Score: 24   End of Session Nurse Communication: Mobility status  Activity Tolerance: Patient tolerated treatment well Patient left: in chair;with call bell/phone within reach  OT Visit Diagnosis: Muscle weakness (generalized) (M62.81)                Time: 3646-8032 OT Time Calculation (min): 22 min Charges:  OT General Charges $OT Visit: 1 Visit OT Evaluation $OT Eval Low Complexity: 1 Low  Misk Galentine, OT/L   Acute OT Clinical Specialist Acute Rehabilitation Services Pager [phone] Office [phone]   [facility] 02/03/2020, 11:16 AM

## 2020-02-03 NOTE — Progress Notes (Signed)
PROGRESS NOTE    THERIN Chavez  ZOX:[id number] DOB: 1961/01/21 DOA: 02/02/2020 PCP: Health, [facility] Personal   Brief Narrative:  HPI per [name] on 02/02/20 Travis Chavez is a 59 y.o. male with medical history significant of refractory hypertension, IIDM, HLD, TIA and strokes, presented with new onset of left-sided weakness numbness and slurred speech and confusion.  Patient traveled from [place] to [place] yesterday.  Patient admitted that his blood pressure has been poorly controlled despite on multiple BP meds, systolic blood pressure is around 160s-170s and diastolic blood 110s are his baseline.  This morning, he took his BP meds in the morning and think started his breakfast.  Then suddenly, he felt left arm and 5 fingers numbness, family member found him confused and speech becomes slurred.  Symptoms lasted about 1 hour, a prolonged arrival in ED, patient felt the left arm weakness has improved but numbness persisted, he also reported his speech has returned to his baseline.  And confusion also subsided.  Denied any weakness or numbness of any other limbs, no blurry vision no hearing changes no headache. ED Course: Code stroke was called, CT head showed advanced medical vascular ischemia chronic, chronic infarct of the left basal ganglia and adjacent white matter. K 3.1, Cre 1.3 (Baseline 1.2 two years ago).  **Interim History Stroke work up complete and likely had a TIA. Was going to discharge this evening but BP remains High. Will resume BP medications and continue to monitor overnight and Anticipate D/C in the AM  Assessment & Plan:   Active Problems:   TIA (transient ischemic attack)   Pre-diabetes  TIA -With new onset of left arm paresis and paresthesia, dysphagia, probably 2/2 uncontrolled HTN. -ABCD2=4, to the stroke risks 4.1% -Start aspirin, continue statin, check A1c and lipid panel; will add Plavix as well -Blood pressure persistently high  despite already taking today's BP meds, start as needed hydralazine p.o. but received a dose of IV today -We will resume his hydrochlorothiazide and Terazosin -CTA Segmental high-grade stenosis of the proximal left M1 MCA and proximal right P2 PCA.,  Neurology recommends outpatient follow-up with Dr. Pearlean Brownie and Dr. Daisy Blossom recommends close management with vascular risk factors -Less likely seizure and Dr. Daisy Blossom recommends EEG in outpatient setting -PT OT evaluation recommend no follow-up -Stroke work-up done an echo was done and showed an EF of 55 to 60% -Monitor did not show any acute infarct but did show moderate chronic microvascular ischemic changes necrotic hemorrhage left putamen -Dual antiplatelet therapy with aspirin and Plavix and and then resuming aspirin alone after 3 weeks  HTN -As above, restart HCTZ; no longer taking ACE inhibitor or minoxidil and only takes verapamil which will hold given concern for transient third-degree AV block. -I spoke with cardiology who recommends outpatient follow-up and we have started the patient on amlodipine and resumed his hydrochlorothiazide Her blood pressure was elevated prior to discharge and will hold his discharge as it was elevated at 170/117 despite given IV hydralazine -Continue to Monitor Blood Pressures closely and anticipate D/C home in the Am    Prediabetes -Hold Metformin for 72 hours; patient is no longer taking it -CBGs ranging from 80-119  HLD -Increased AtorvaStatin to 80 mg qHS -Recheck Lipid panel and showed total cholesterol/HDL ratio of 4.9, cholesterol level of 185, HDL level of 38, LDL of 120, triglycerides of 135, VLDL 27  GERD -Continue with PPI and famotidine  CKD stage II -Cre level stable, Euvolumic -Patient's BUNs/creatinine  is now 18/1.22 next-avoid nephrotoxic medications, contrast dyes, hypotension and renally dose medications  Obesity -Estimated body mass index is 38.27 kg/m as calculated from the  following:   Height as of this encounter: 5\' 5"  (1.651 m).   Weight as of this encounter: 104.3 kg. -Weight Loss and Dietary Counseling given    DVT prophylaxis: Enoxaparin 40 mg sq q24h Code Status: FULL CODE Family Communication: Discussed with wife at bedside  Disposition Plan: Anticipate D/C home in AM given uncontrolled BP that was not amenable to IV Hydralazine   Status is: Observation  The patient remains OBS appropriate and will d/c before 2 midnights.  Dispo: The patient is from: Home              Anticipated d/c is to: Home              Anticipated d/c date is: 1 day              Patient currently is not medically stable to d/c.  Consultants:   Neurology   Procedures:  ECHOCARDIOGRAM IMPRESSIONS    1. Left ventricular ejection fraction, by estimation, is 55 to 60%. The  left ventricle has normal function. The left ventricle has no regional  wall motion abnormalities. There is moderate left ventricular hypertrophy.  Left ventricular diastolic  parameters are indeterminate.  2. Right ventricular systolic function is normal. The right ventricular  size is normal. Tricuspid regurgitation signal is inadequate for assessing  PA pressure.  3. The mitral valve is grossly normal. Trivial mitral valve  regurgitation.  4. The aortic valve is tricuspid. Aortic valve regurgitation is not  visualized.  5. The inferior vena cava is normal in size with greater than 50%  respiratory variability, suggesting right atrial pressure of 3 mmHg.   FINDINGS  Left Ventricle: Left ventricular ejection fraction, by estimation, is 55  to 60%. The left ventricle has normal function. The left ventricle has no  regional wall motion abnormalities. The left ventricular internal cavity  size was normal in size. There is  moderate left ventricular hypertrophy. Left ventricular diastolic  parameters are indeterminate.   Right Ventricle: The right ventricular size is normal. No  increase in  right ventricular wall thickness. Right ventricular systolic function is  normal. Tricuspid regurgitation signal is inadequate for assessing PA  pressure.   Left Atrium: Left atrial size was normal in size.   Right Atrium: Right atrial size was normal in size.   Pericardium: There is no evidence of pericardial effusion.   Mitral Valve: The mitral valve is grossly normal. Trivial mitral valve  regurgitation.   Tricuspid Valve: The tricuspid valve is grossly normal. Tricuspid valve  regurgitation is trivial.   Aortic Valve: The aortic valve is tricuspid. Aortic valve regurgitation is  not visualized. Mild aortic valve annular calcification.   Pulmonic Valve: The pulmonic valve was grossly normal. Pulmonic valve  regurgitation is trivial.   Aorta: The aortic root is normal in size and structure.   Venous: The inferior vena cava is normal in size with greater than 50%  respiratory variability, suggesting right atrial pressure of 3 mmHg.   IAS/Shunts: No atrial level shunt detected by color flow Doppler.     LEFT VENTRICLE  PLAX 2D  LVIDd:     4.20 cm   Diastology  LVIDs:     2.70 cm   LV e' lateral:  8.49 cm/s  LV PW:     1.60 cm   LV E/e' lateral:  4.9  LV IVS:    1.40 cm   LV e' medial:  7.62 cm/s  LVOT diam:   2.30 cm   LV E/e' medial: 5.5  LV SV:     57  LV SV Index:  27  LVOT Area:   4.15 cm    LV Volumes (MOD)  LV vol d, MOD A4C: 73.5 ml  LV vol s, MOD A4C: 38.4 ml  LV SV MOD A4C:   73.5 ml   RIGHT VENTRICLE       IVC  RV S prime:   12.60 cm/s IVC diam: 1.50 cm  TAPSE (M-mode): 1.8 cm   LEFT ATRIUM       Index    RIGHT ATRIUM     Index  LA diam:    4.00 cm 1.91 cm/m RA Area:   8.56 cm  LA Vol (A2C):  42.1 ml 20.06 ml/m RA Volume:  14.90 ml 7.10 ml/m  LA Vol (A4C):  39.5 ml 18.82 ml/m  LA Biplane Vol: 41.9 ml 19.96 ml/m  AORTIC VALVE  LVOT Vmax:  62.70 cm/s  LVOT  Vmean: 41.300 cm/s  LVOT VTI:  0.136 m    AORTA  Ao Root diam: 3.40 cm  Ao Asc diam: 3.70 cm   MITRAL VALVE  MV Area (PHT): 2.50 cm  SHUNTS  MV Decel Time: 303 msec  Systemic VTI: 0.14 m  MV E velocity: 41.60 cm/s Systemic Diam: 2.30 cm  MV A velocity: 79.30 cm/s  MV E/A ratio: 0.52   Antimicrobials:  Anti-infectives (From admission, onward)   None      Subjective: Seen and examined at bedside and states his symptoms have resolved.  No nausea or vomiting.  Denies any chest pain lightheadedness or dizziness.  Feels well and was going to go home today but blood pressure was significantly elevated in the evening prior to discharge so he will be discharged tomorrow morning after blood pressure is improved as his blood pressure did not improve with IV hydralazine currently.  Objective: Vitals:   02/03/20 1156 02/03/20 1556 02/03/20 1702 02/03/20 1800  BP: (!) 163/109 (!) 166/113 (!) 171/118 (!) 170/117  Pulse: 66 76  87  Resp: 18 18 18 20   Temp: 98.1 F (36.7 C) 98.2 F (36.8 C) 98.8 F (37.1 C) 98.2 F (36.8 C)  TempSrc: Oral Oral Oral Oral  SpO2: 100% 99% 99% 100%  Weight:      Height:        Intake/Output Summary (Last 24 hours) at 02/03/2020 1858 Last data filed at 02/03/2020 1300 Gross per 24 hour  Intake 800 ml  Output --  Net 800 ml   Filed Weights   02/02/20 1246  Weight: 104.3 kg   Examination: Physical Exam:  Constitutional: WN/WD Obese AAM in NAD and appears calm and comfortable Eyes: Lids and conjunctivae normal, sclerae anicteric  ENMT: External Ears, Nose appear normal. Grossly normal hearing.  Neck: Appears normal, supple, no cervical masses, normal ROM, no appreciable thyromegaly; no JVD Respiratory: Diminished to auscultation bilaterally, no wheezing, rales, rhonchi or crackles. Normal respiratory effort and patient is not tachypenic. No accessory muscle use.  Unlabored breathing Cardiovascular: RRR, no murmurs / rubs / gallops. S1 and  S2 auscultated.  Trace extremity edema Abdomen: Soft, non-tender, distended secondary by habitus.  Bowel sounds positive.  GU: Deferred. Musculoskeletal: No clubbing / cyanosis of digits/nails. Skin: No rashes, lesions, ulcers on limited skin evaluation. No induration; Warm and dry.  Neurologic: CN 2-12 grossly  intact with no focal deficits but does have some right hemiparesis weakness from prior stroke. Romberg sign and cerebellar reflexes not assessed.  Psychiatric: Normal judgment and insight. Alert and oriented x 3. Normal mood and appropriate affect.   Data Reviewed: I have personally reviewed following labs and imaging studies  CBC: Recent Labs  Lab 02/02/20 1127 02/02/20 1131 02/03/20 0112  WBC 10.0  --  9.2  NEUTROABS 5.6  --  5.6  HGB 14.7 15.6 14.0  HCT 45.9 46.0 43.1  MCV 87.9  --  87.1  PLT 331  --  315   Basic Metabolic Panel: Recent Labs  Lab 02/02/20 1127 02/02/20 1131 02/03/20 0112  NA 138 140 139  K 3.1* 3.0* 3.5  CL 103 104 103  CO2 24  --  26  GLUCOSE 134* 129* 99  BUN CREATININE 1.41* 1.30* 1.22  CALCIUM 9.2  --  9.1  MG  --   --  1.8   GFR: Estimated Creatinine Clearance: 74.3 mL/min (by C-G formula based on SCr of 1.22 mg/dL). Liver Function Tests: Recent Labs  Lab 02/02/20 1127  AST 22  ALT 20  ALKPHOS 70  BILITOT 0.5  PROT 7.2  ALBUMIN 4.1   No results for input(s): LIPASE, AMYLASE in the last 168 hours. No results for input(s): AMMONIA in the last 168 hours. Coagulation Profile: Recent Labs  Lab 02/02/20 1127  INR 1.0   Cardiac Enzymes: No results for input(s): CKTOTAL, CKMB, CKMBINDEX, TROPONINI in the last 168 hours. BNP (last 3 results) No results for input(s): PROBNP in the last 8760 hours. HbA1C: Recent Labs    02/03/20 0112  HGBA1C 6.0*   CBG: Recent Labs  Lab 02/02/20 1725 02/02/20 2121 02/03/20 0602 02/03/20 1153 02/03/20 1700  GLUCAP 96 99 91 119* 80   Lipid Profile: Recent Labs     02/03/20 0112  CHOL 185  HDL 38*  LDLCALC 120*  TRIG 135  CHOLHDL 4.9   Thyroid Function Tests: No results for input(s): TSH, T4TOTAL, FREET4, T3FREE, THYROIDAB in the last 72 hours. Anemia Panel: No results for input(s): VITAMINB12, FOLATE, FERRITIN, TIBC, IRON, RETICCTPCT in the last 72 hours. Sepsis Labs: No results for input(s): PROCALCITON, LATICACIDVEN in the last 168 hours.  Recent Results (from the past 240 hour(s))  SARS Coronavirus 2 by RT PCR (hospital order, performed in [facility] hospital lab) Nasopharyngeal Nasopharyngeal Swab     Status: None   Collection Time: 02/02/20  5:00 PM   Specimen: Nasopharyngeal Swab  Result Value Ref Range Status   SARS Coronavirus 2 NEGATIVE NEGATIVE Final    Comment: (NOTE) SARS-CoV-2 target nucleic acids are NOT DETECTED.  The SARS-CoV-2 RNA is generally detectable in upper and lower respiratory specimens during the acute phase of infection. The lowest concentration of SARS-CoV-2 viral copies this assay can detect is 250 copies / mL. A negative result does not preclude SARS-CoV-2 infection and should not be used as the sole basis for treatment or other patient management decisions.  A negative result may occur with improper specimen collection / handling, submission of specimen other than nasopharyngeal swab, presence of viral mutation(s) within the areas targeted by this assay, and inadequate number of viral copies (<250 copies / mL). A negative result must be combined with clinical observations, patient history, and epidemiological information.  Fact Sheet for Patients:   BoilerBrush.com.cy  Fact Sheet for Healthcare Providers: https://pope.com/  This test is not yet approved or  cleared by the  [place] [employer] and has been authorized for detection and/or diagnosis of SARS-CoV-2 by FDA under an TEFL teacher (EUA).  This EUA will remain in effect (meaning this  test can be used) for the duration of the COVID-19 declaration under Section 564(b)(1) of the Act, 21 U.S.C. section 360bbb-3(b)(1), unless the authorization is terminated or revoked sooner.  Performed at [facility], [address], [place], [place] [address]      RN Pressure Injury Documentation:     Estimated body mass index is 38.27 kg/m as calculated from the following:   Height as of this encounter: 5\' 5"  (1.651 m).   Weight as of this encounter: 104.3 kg.  Malnutrition Type:      Malnutrition Characteristics:      Nutrition Interventions:     Radiology Studies: MR BRAIN WO CONTRAST  Result Date: 02/02/2020 CLINICAL DATA:  TIA. Left-sided weakness numbness and slurred speech. Confusion. EXAM: MRI HEAD WITHOUT CONTRAST TECHNIQUE: Multiplanar, multiecho pulse sequences of the brain and surrounding structures were obtained without intravenous contrast. COMPARISON:  CT angio head and neck 02/02/2020. FINDINGS: Brain: Negative for acute infarct. Moderate chronic microvascular ischemic change throughout the white matter bilaterally. Small chronic infarct right cerebellum. Brainstem intact. Chronic hemorrhage in the left putamen. Negative for mass lesion. Ventricle size normal. Vascular: Normal arterial flow voids. Skull and upper cervical spine: No focal skeletal lesion. Sinuses/Orbits: Mild mucosal edema paranasal sinuses. Negative orbit Other: None IMPRESSION: Negative for acute infarct Moderate chronic microvascular ischemic change. Chronic hemorrhage left putamen. Electronically Signed   By: 02/04/2020 M.D.   On: 02/02/2020 17:11   ECHOCARDIOGRAM COMPLETE  Result Date: 02/03/2020    ECHOCARDIOGRAM REPORT   Patient Name:   Travis Chavez Date of Exam: 02/03/2020 Medical Rec #:  02/05/2020        Height:       65.0 in Accession #:    [id number]       Weight:       230.0 lb Date of Birth:  12/06/1960         BSA:          2.099 m Patient Age:    59 years         BP:            152/99 mmHg Patient Gender: M                HR:           57 bpm. Exam Location:  Inpatient Procedure: 2D Echo Indications:    TIA 435.9  History:        Patient has no prior history of Echocardiogram examinations.  Sonographer:    10-07-1984 Referring Phys: Delcie Roch PING T ZHANG IMPRESSIONS  1. Left ventricular ejection fraction, by estimation, is 55 to 60%. The left ventricle has normal function. The left ventricle has no regional wall motion abnormalities. There is moderate left ventricular hypertrophy. Left ventricular diastolic parameters are indeterminate.  2. Right ventricular systolic function is normal. The right ventricular size is normal. Tricuspid regurgitation signal is inadequate for assessing PA pressure.  3. The mitral valve is grossly normal. Trivial mitral valve regurgitation.  4. The aortic valve is tricuspid. Aortic valve regurgitation is not visualized.  5. The inferior vena cava is normal in size with greater than 50% respiratory variability, suggesting right atrial pressure of 3 mmHg. FINDINGS  Left Ventricle: Left ventricular ejection fraction, by estimation, is 55 to 60%. The  left ventricle has normal function. The left ventricle has no regional wall motion abnormalities. The left ventricular internal cavity size was normal in size. There is  moderate left ventricular hypertrophy. Left ventricular diastolic parameters are indeterminate. Right Ventricle: The right ventricular size is normal. No increase in right ventricular wall thickness. Right ventricular systolic function is normal. Tricuspid regurgitation signal is inadequate for assessing PA pressure. Left Atrium: Left atrial size was normal in size. Right Atrium: Right atrial size was normal in size. Pericardium: There is no evidence of pericardial effusion. Mitral Valve: The mitral valve is grossly normal. Trivial mitral valve regurgitation. Tricuspid Valve: The tricuspid valve is grossly normal. Tricuspid valve  regurgitation is trivial. Aortic Valve: The aortic valve is tricuspid. Aortic valve regurgitation is not visualized. Mild aortic valve annular calcification. Pulmonic Valve: The pulmonic valve was grossly normal. Pulmonic valve regurgitation is trivial. Aorta: The aortic root is normal in size and structure. Venous: The inferior vena cava is normal in size with greater than 50% respiratory variability, suggesting right atrial pressure of 3 mmHg. IAS/Shunts: No atrial level shunt detected by color flow Doppler.  LEFT VENTRICLE PLAX 2D LVIDd:         4.20 cm     Diastology LVIDs:         2.70 cm     LV e' lateral:   8.49 cm/s LV PW:         1.60 cm     LV E/e' lateral: 4.9 LV IVS:        1.40 cm     LV e' medial:    7.62 cm/s LVOT diam:     2.30 cm     LV E/e' medial:  5.5 LV SV:         57 LV SV Index:   27 LVOT Area:     4.15 cm  LV Volumes (MOD) LV vol d, MOD A4C: 73.5 ml LV vol s, MOD A4C: 38.4 ml LV SV MOD A4C:     73.5 ml RIGHT VENTRICLE             IVC RV S prime:     12.60 cm/s  IVC diam: 1.50 cm TAPSE (M-mode): 1.8 cm LEFT ATRIUM             Index       RIGHT ATRIUM          Index LA diam:        4.00 cm 1.91 cm/m  RA Area:     8.56 cm LA Vol (A2C):   42.1 ml 20.06 ml/m RA Volume:   14.90 ml 7.10 ml/m LA Vol (A4C):   39.5 ml 18.82 ml/m LA Biplane Vol: 41.9 ml 19.96 ml/m  AORTIC VALVE LVOT Vmax:   62.70 cm/s LVOT Vmean:  41.300 cm/s LVOT VTI:    0.136 m  AORTA Ao Root diam: 3.40 cm Ao Asc diam:  3.70 cm MITRAL VALVE MV Area (PHT): 2.50 cm    SHUNTS MV Decel Time: 303 msec    Systemic VTI:  0.14 m MV E velocity: 41.60 cm/s  Systemic Diam: 2.30 cm MV A velocity: 79.30 cm/s MV E/A ratio:  0.52 Nona Dell MD Electronically signed by Nona Dell MD Signature Date/Time: 02/03/2020/10:46:59 AM    Final    CT HEAD CODE STROKE WO CONTRAST  Result Date: 02/02/2020 CLINICAL DATA:  Code stroke.  Left-sided weakness EXAM: CT HEAD WITHOUT CONTRAST TECHNIQUE: Contiguous axial images were obtained from the  base  of the skull through the vertex without intravenous contrast. COMPARISON:  Outside study 2017 FINDINGS: Brain: No acute intracranial hemorrhage mass effect, or edema. No new loss of gray-white differentiation. There is a chronic infarct of the left basal ganglia and adjacent white matter. Patchy and confluent areas of hypoattenuation in the supratentorial white matter nonspecific but may reflect advanced chronic microvascular ischemic changes greater than expected for age. No extra-axial fluid collection. Vascular: No hyperdense vessel. Skull: Unremarkable. Sinuses/Orbits: Aerated.  Orbits are unremarkable. Other: Mastoid air cells are clear. ASPECTS (Alberta Stroke Program Early CT Score) - Ganglionic level infarction (caudate, lentiform nuclei, internal capsule, insula, M1-M3 cortex): 7 - Supraganglionic infarction (M4-M6 cortex): 3 Total score (0-10 with 10 being normal): 10 IMPRESSION: No acute intracranial hemorrhage or evidence of acute infarction. ASPECT score is 10. Advanced chronic microvascular ischemic changes greater than expected for age. 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 Initial results were communicated to Dr. Otelia Limes at 11:38 am on 02/02/2020 by text page via the [facility] messaging system. Electronically Signed   By: Guadlupe Spanish M.D.   On: 02/02/2020 11:48   CT ANGIO HEAD CODE STROKE  Result Date: 02/02/2020 CLINICAL DATA:  Left-sided weakness EXAM: CT ANGIOGRAPHY HEAD AND NECK TECHNIQUE: Multidetector CT imaging of the head and neck was performed using the standard protocol during bolus administration of intravenous contrast. Multiplanar CT image reconstructions and MIPs were obtained to evaluate the vascular anatomy. Carotid stenosis measurements (when applicable) are obtained utilizing NASCET criteria, using the distal internal carotid diameter as the denominator. CONTRAST:  75mL OMNIPAQUE IOHEXOL 350 MG/ML SOLN COMPARISON:  None. FINDINGS: CTA NECK  Aortic arch: Great vessel origins are patent. Right carotid system: Patent. No measurable stenosis at the ICA origin. Left carotid system: Patent. No measurable stenosis at the ICA origin. Vertebral arteries: Patent and codominant.  No measurable stenosis. Skeleton: Mild cervical spine degenerative changes. Other neck: No mass or adenopathy. Upper chest: Cardiomegaly.  No apical lung mass. Review of the MIP images confirms the above findings CTA HEAD Anterior circulation: Intracranial internal carotid arteries are patent. There is calcified plaque along the distal supraclinoid right ICA causing less than 50% stenosis. Anterior and middle cerebral arteries are patent. There is short segment high-grade stenosis of the proximal left M1 MCA. Posterior circulation: Intracranial vertebral arteries are patent. Mixed plaque on the left causes up to moderate stenosis. PICA origins are patent. Basilar artery is patent. Posterior cerebral arteries are patent. Bilateral posterior communicating arteries are present and there is fetal origin of the left posterior cerebral artery. There is segmental high-grade stenosis of the proximal right P2 PCA. Venous sinuses: Patent as allowed by contrast bolus timing. Review of the MIP images confirms the above findings IMPRESSION: No large vessel occlusion. No hemodynamically significant stenosis in the neck. 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 Electronically Signed   By: Guadlupe Spanish M.D.   On: 02/02/2020 11:58   CT ANGIO NECK CODE STROKE  Result Date: 02/02/2020 CLINICAL DATA:  Left-sided weakness EXAM: CT ANGIOGRAPHY HEAD AND NECK TECHNIQUE: Multidetector CT imaging of the head and neck was performed using the standard protocol during bolus administration of intravenous contrast. Multiplanar CT image reconstructions and MIPs were obtained to evaluate the vascular anatomy. Carotid stenosis measurements (when applicable) are obtained  utilizing NASCET criteria, using the distal internal carotid diameter as the denominator. CONTRAST:  75mL OMNIPAQUE IOHEXOL 350 MG/ML SOLN COMPARISON:  None. FINDINGS: CTA  NECK Aortic arch: Great vessel origins are patent. Right carotid system: Patent. No measurable stenosis at the ICA origin. Left carotid system: Patent. No measurable stenosis at the ICA origin. Vertebral arteries: Patent and codominant.  No measurable stenosis. Skeleton: Mild cervical spine degenerative changes. Other neck: No mass or adenopathy. Upper chest: Cardiomegaly.  No apical lung mass. Review of the MIP images confirms the above findings CTA HEAD Anterior circulation: Intracranial internal carotid arteries are patent. There is calcified plaque along the distal supraclinoid right ICA causing less than 50% stenosis. Anterior and middle cerebral arteries are patent. There is short segment high-grade stenosis of the proximal left M1 MCA. Posterior circulation: Intracranial vertebral arteries are patent. Mixed plaque on the left causes up to moderate stenosis. PICA origins are patent. Basilar artery is patent. Posterior cerebral arteries are patent. Bilateral posterior communicating arteries are present and there is fetal origin of the left posterior cerebral artery. There is segmental high-grade stenosis of the proximal right P2 PCA. Venous sinuses: Patent as allowed by contrast bolus timing. Review of the MIP images confirms the above findings IMPRESSION: No large vessel occlusion. No hemodynamically significant stenosis in the neck. 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 Electronically Signed   By: Guadlupe SpanishPraneil  Patel M.D.   On: 02/02/2020 11:58   Scheduled Meds: . amLODipine  10 mg Oral Daily  . aspirin EC  81 mg Oral Daily  . [START ON 02/04/2020] atorvastatin  80 mg Oral Daily  . clopidogrel  75 mg Oral Daily  . enoxaparin (LOVENOX) injection  40 mg Subcutaneous Q24H  . famotidine  20 mg  Oral Daily  . hydrochlorothiazide  25 mg Oral Daily  . insulin aspart  0-9 Units Subcutaneous TID WC  . pantoprazole  40 mg Oral Daily  . terazosin  5 mg Oral QHS   Continuous Infusions:   LOS: 0 days   Merlene Laughtermair Latif Finn Altemose, DO [facility] PAGER is on AMION  If 7PM-7AM, please contact night-coverage [email or web address]
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
## 2020-02-03 NOTE — Plan of Care (Signed)
  Problem: Education: Goal: Knowledge of General Education information will improve Description: Including pain rating scale, medication(s)/side effects and non-pharmacologic comfort measures 02/03/2020 1835 by Coralie Common, RN Outcome: Progressing 02/03/2020 1736 by Coralie Common, RN Outcome: Adequate for Discharge   Problem: Health Behavior/Discharge Planning: Goal: Ability to manage health-related needs will improve 02/03/2020 1835 by Coralie Common, RN Outcome: Progressing 02/03/2020 1736 by Coralie Common, RN Outcome: Adequate for Discharge   Problem: Clinical Measurements: Goal: Ability to maintain clinical measurements within normal limits will improve 02/03/2020 1835 by Coralie Common, RN Outcome: Progressing 02/03/2020 1736 by Coralie Common, RN Outcome: Adequate for Discharge   Problem: Clinical Measurements: Goal: Will remain free from infection 02/03/2020 1835 by Coralie Common, RN Outcome: Progressing 02/03/2020 1736 by Coralie Common, RN Outcome: Adequate for Discharge   Problem: Activity: Goal: Risk for activity intolerance will decrease 02/03/2020 1835 by Coralie Common, RN Outcome: Progressing 02/03/2020 1736 by Coralie Common, RN Outcome: Adequate for Discharge   Problem: Coping: Goal: Level of anxiety will decrease 02/03/2020 1835 by Coralie Common, RN Outcome: Progressing 02/03/2020 1736 by Coralie Common, RN Outcome: Adequate for Discharge   Problem: Elimination: Goal: Will not experience complications related to bowel motility 02/03/2020 1835 by Coralie Common, RN Outcome: Progressing 02/03/2020 1736 by Coralie Common, RN Outcome: Adequate for Discharge   Problem: Safety: Goal: Ability to remain free from injury will improve 02/03/2020 1835 by Coralie Common, RN Outcome: Progressing 02/03/2020 1736 by Coralie Common, RN Outcome: Adequate for Discharge   Problem: Skin Integrity: Goal: Risk for impaired skin integrity  will decrease 02/03/2020 1835 by Coralie Common, RN Outcome: Progressing 02/03/2020 1736 by Coralie Common, RN Outcome: Adequate for Discharge   Problem: Education: Goal: Knowledge of disease or condition will improve 02/03/2020 1835 by Coralie Common, RN Outcome: Progressing 02/03/2020 1736 by Coralie Common, RN Outcome: Adequate for Discharge   Problem: Education: Goal: Knowledge of secondary prevention will improve 02/03/2020 1835 by Coralie Common, RN Outcome: Progressing 02/03/2020 1736 by Coralie Common, RN Outcome: Adequate for Discharge

## 2020-02-03 NOTE — Progress Notes (Signed)
OT Cancellation Note  Patient Details Name: KINGDAVID LEINBACH MRN: [id number] DOB: 09-05-1960   Cancelled Treatment:    Reason Eval/Treat Not Completed: Patient at procedure or test/ unavailable (ECHO)  Burnett Corrente Joaquin Knebel, OT/L   Acute OT Clinical Specialist Acute Rehabilitation Services Pager [phone] Office [phone]  02/03/2020, 9:23 AM

## 2020-02-03 NOTE — Evaluation (Signed)
Physical Therapy Evaluation Patient Details Name: Travis Chavez MRN: [id number] DOB: [birth year] Today's Date: 02/03/2020   History of Present Illness  59 y.o. male with medical history significant of refractory hypertension, IIDM, HLD, TIA and strokes, presented with new onset of left-sided weakness numbness and slurred speech and confusionCT head showed advanced medical vascular ischemia chronic, chronic infarct of the left basal ganglia and adjacent white matter. Residual weakness R side from prior CVA in 2012. s/p  L THR 2020.    Clinical Impression  Pt walks with limp that he said he has had since CVA in 2012.  Pt with decreased R hip flexion and DF.  I showed him exercises that he can do to help improve this - he has the motor function needed but just habit at this point.  I also talked to pt and wife about healthy living habits going forward.  No further skilled PT needs for pt identified.    Follow Up Recommendations No PT follow up    Equipment Recommendations  None recommended by PT    Recommendations for Other Services       Precautions / Restrictions Precautions Precautions: None Precaution Comments: Pt denies falls Restrictions Weight Bearing Restrictions: No      Mobility  Bed Mobility Overal bed mobility: Modified Independent             General bed mobility comments: Pt sitting in chair when i arrived  Transfers Overall transfer level: Modified independent Equipment used: None                Ambulation/Gait Ambulation/Gait assistance: Modified independent (Device/Increase time) Gait Distance (Feet): 200 Feet Assistive device: None       General Gait Details: Pt walks with decreased hip flexion and DF on R - drags leg - he says since first CVA in 2013 .  He had PT for 6 months after that and has walked like this since.  He wears out shoe on R before L.  No loss of balance seen  Stairs            Wheelchair Mobility    Modified  Rankin (Stroke Patients Only)       Balance Overall balance assessment: Mild deficits observed, not formally tested                                           Pertinent Vitals/Pain Pain Assessment: No/denies pain    Home Living Family/patient expects to be discharged to:: Private residence Living Arrangements: Spouse/significant other Available Help at Discharge: Family;Available 24 hours/day Type of Home: House Home Access: Stairs to enter Entrance Stairs-Rails: Right;Left;Can reach both Entrance Stairs-Number of Steps: 7 Home Layout: Two level;Able to live on main level with bedroom/bathroom Home Equipment: Dan Humphreys - 2 wheels;Wheelchair - manual;Bedside commode;Cane - single point      Prior Function Level of Independence: Independent         Comments: Independent with ADL and IADL     Hand Dominance   Dominant Hand: Right    Extremity/Trunk Assessment   Upper Extremity Assessment Upper Extremity Assessment: Defer to OT evaluation RUE Deficits / Details: residual weakness form prior CVA; uses as functional assist RUE Coordination: decreased fine motor;decreased gross motor    Lower Extremity Assessment Lower Extremity Assessment: RLE deficits/detail RLE Deficits / Details: Pt with weakness in R hip flexors and peroneals/ant tib.  Educated on exercises to help.  Pt denies numbness    Cervical / Trunk Assessment Cervical / Trunk Assessment: Normal  Communication   Communication: No difficulties  Cognition Arousal/Alertness: Awake/alert Behavior During Therapy: WFL for tasks assessed/performed Overall Cognitive Status: Within Functional Limits for tasks assessed                                        General Comments General comments (skin integrity, edema, etc.): Pt and wife asked questions and educated on what TIA is, importance of monitoring BP esp when not feeling good, diet ideas for helping lower blood sugar (pt said he  is prediabetic) and phone apps to help monitor walking/steps and motivate a more active lifestyel.    Exercises Other Exercises Other Exercises: Pt standing at sink - one UE - hip abduction - keeping trunk erect 10x B - hard R stance Other Exercises: Pt standing at sink - one UE - hip flexion - 10x B - R leg much weaker/tired than L but able to do it. Other Exercises: Reclined - ankle circles - CW/CCW - 5x4 sets B - R very hard - esp DF/EV - but able to do it with concentration.   Assessment/Plan    PT Assessment Patent does not need any further PT services  PT Problem List         PT Treatment Interventions      PT Goals (Current goals can be found in the Care Plan section)  Acute Rehab PT Goals Patient Stated Goal: to have this not happen again PT Goal Formulation: With patient/family Time For Goal Achievement: 02/03/20 Potential to Achieve Goals: Good    Frequency     Barriers to discharge        Co-evaluation               AM-PAC PT "6 Clicks" Mobility  Outcome Measure Help needed turning from your back to your side while in a flat bed without using bedrails?: None Help needed moving from lying on your back to sitting on the side of a flat bed without using bedrails?: None Help needed moving to and from a bed to a chair (including a wheelchair)?: None Help needed standing up from a chair using your arms (e.g., wheelchair or bedside chair)?: None Help needed to walk in hospital room?: None Help needed climbing 3-5 steps with a railing? : None 6 Click Score: 24    End of Session Equipment Utilized During Treatment: Gait belt Activity Tolerance: Patient tolerated treatment well;No increased pain Patient left: in chair;with family/visitor present;with call bell/phone within reach Nurse Communication: Mobility status PT Visit Diagnosis: Unsteadiness on feet (R26.81);Hemiplegia and hemiparesis Hemiplegia - Right/Left: Left    Time: 1130-1155 PT Time Calculation  (min) (ACUTE ONLY): 25 min   Charges:   PT Evaluation $PT Eval Low Complexity: 1 Low PT Treatments $Therapeutic Exercise: 8-22 mins        02/03/2020   Ranae Palms, PT   Judson Roch 02/03/2020, 12:58 PM

## 2020-02-03 NOTE — Plan of Care (Signed)
Adequate for discharge.

## 2020-02-04 DIAGNOSIS — R7303 Prediabetes: Secondary | ICD-10-CM | POA: Diagnosis not present

## 2020-02-04 DIAGNOSIS — I10 Essential (primary) hypertension: Secondary | ICD-10-CM

## 2020-02-04 LAB — GLUCOSE, CAPILLARY
Glucose-Capillary: 97 mg/dL (ref 70–99)
Glucose-Capillary: 113 mg/dL — ABNORMAL HIGH (ref 70–99)

## 2020-02-04 MED ORDER — HYDRALAZINE HCL 20 MG/ML IJ SOLN: 10.0000 mg | Freq: Once | INTRAMUSCULAR | Status: DC

## 2020-02-04 MED ORDER — HYDRALAZINE HCL 50 MG PO TABS
50.0000 mg | ORAL_TABLET | Freq: Three times a day (TID) | ORAL | Status: DC
  Administered 2020-02-04: 50 mg via ORAL
  Filled 2020-02-04: qty 1

## 2020-02-04 MED ORDER — HYDRALAZINE HCL 50 MG PO TABS: 50.0000 mg | ORAL_TABLET | Freq: Three times a day (TID) | ORAL | 0 refills | Status: DC

## 2020-02-04 MED ORDER — HYDROCHLOROTHIAZIDE 25 MG PO TABS: 25.0000 mg | ORAL_TABLET | Freq: Every day | ORAL | 0 refills | Status: DC

## 2020-02-04 MED ORDER — ENOXAPARIN SODIUM 60 MG/0.6ML ~~LOC~~ SOLN: 0.5000 mg/kg | SUBCUTANEOUS | Status: DC

## 2020-02-04 NOTE — Discharge Summary (Signed)
Physician Discharge Summary  Travis Chavez ZOX:[id number] DOB: 1961/03/16 DOA: 02/02/2020  PCP: [facility]  Admit date: 02/02/2020 Discharge date: 02/04/2020  Admitted From: Home Disposition: Home  Recommendations for Outpatient Follow-up:  1. Follow up with PCP in 1-2 weeks 2. Follow-up with neurology 4 weeks 3. Follow-up with cardiology within 1 to 2 weeks 4. Please obtain CMP/CBC, Mag, Phos in one week 5. Please follow up on the following pending results:  Home Health: No Equipment/Devices: None    Discharge Condition: Stable   CODE STATUS: FULL CODE Diet recommendation: Heart Healthy Carb Modified Diet  Brief/Interim Summary: HPI per [name] on 02/02/20 Travis Chavez a 58 y.o.malewith medical history significant ofrefractory hypertension, IIDM, HLD,TIA and strokes,presented with new onset of left-sided weakness numbness and slurred speech and confusion. Patient traveled from [place] to [place]. Patient admitted that his blood pressure has been poorly controlled despite on multiple BP meds, systolic blood pressure is around 160s-170sand diastolic blood 110s are his baseline.This morning, he took his BP meds in the morning and think startedhis breakfast.Then suddenly, he felt left arm and 5 fingers numbness,family member found him confused and speech becomes slurred. Symptoms lasted about 1 hour, a prolonged arrival in ED, patient felt the left arm weakness has improved but numbness persisted, he also reported his speech has returned to his baseline. And confusion also subsided. Denied any weakness or numbness of any other limbs, no blurry vision no hearing changes no headache. ED Course:Code stroke was called, CT head showed advanced medical vascular ischemia chronic, chronic infarct of the left basal ganglia and adjacent white matter.K 3.1, Cre 1.3 (Baseline 1.2 two years ago).  **Interim History Stroke work  up complete and likely had a TIA. Was going to discharge this evening but BP remains High. Will resume BP medications and continue to monitor overnight and pressure was a little elevated again this morning so further medication adjustments were made in this afternoon his blood pressure stabilized and he is stable to be discharged.  He will need to follow-up with PCP, neurology and cardiology in outpatient setting we send him home on hydralazine p.o., amlodipine p.o. as well as hydrochlorothiazide will hold his verapamil until he sees cardiology in outpatient setting.  Will need to follow-up with neurology for his TIA in 4 weeks and continue his medications.  Discharge Diagnoses:  Active Problems:   TIA (transient ischemic attack)   Pre-diabetes  TIA -With new onset of left arm paresis and paresthesia, dysphagia, probably 2/2 uncontrolled HTN. -ABCD2=4,to the stroke risks 4.1% -Start aspirin, continue statin, check A1c and lipid panel; will add Plavix as well -Blood pressure persistently high despite already taking today's BP meds, start as needed hydralazine p.o. but received a dose of IV today -We will resume his hydrochlorothiazide and Terazosin -CTASegmental high-grade stenosis of the proximal left M1 MCA and proximal right P2 PCA.,  Neurology recommends outpatient follow-up with Dr. Pearlean Brownie and Dr. Daisy Blossom recommends close management with vascular risk factors -Less likely seizure and Dr. Daisy Blossom recommends EEG in outpatient setting -PT OT evaluation recommend no follow-up -Stroke work-up done an echo was done and showed an EF of 55 to 60% -Monitor did not show any acute infarct but did show moderate chronic microvascular ischemic changes necrotic hemorrhage left putamen -Dual antiplatelet therapy with aspirin and Plavix and and then resuming aspirin alone after 3 weeks -Follow-up with neurology in outpatient setting  HTN -As above, restart HCTZ; no longer taking ACE inhibitor or  minoxidil  and only takes verapamil which will hold given concern for transient third-degree AV block. -I spoke with cardiology who recommends outpatient follow-up and we have started the patient on amlodipine and resumed his hydrochlorothiazide Her blood pressure was elevated prior to discharge and will hold his discharge as it was elevated at 170/117 despite given IV hydralazine today and today it is improved now 6 prior to discharge -We will add p.o. hydralazine 50 mg every 8 to his regimen -Continue to Monitor Blood Pressures closely and follow-up with closely with PCP as well as cardiology in outpatient setting   Prediabetes -Hold Metformin for 72 hours; patient is no longer taking it so we will discontinue it off is more -CBGs ranging from 80-119  ? Third Degree AV Block -Transient on Telemetry and EKG showed First Degree AV Block -Discussed with Cardiology Dr. Anne Fu and they will see the patient in the outpatient setting and he recommended holding Verapamil and following up closely   HLD -Increased AtorvaStatin to 80 mg qHS -Recheck Lipid panel and showed total cholesterol/HDL ratio of 4.9, cholesterol level of 185, HDL level of 38, LDL of 120, triglycerides of 135, VLDL 27  GERD -Continue with PPI and famotidine  CKD stage II -Cre level stable, Euvolumic -Patient's BUNs/creatinine is now 18/1.22 next-avoid nephrotoxic medications, contrast dyes, hypotension and renally dose medications  Obesity -Estimated body mass index is 38.27 kg/m as calculated from the following:   Height as of this encounter: 5\' 5"  (1.651 m).   Weight as of this encounter: 104.3 kg. -Weight Loss and Dietary Counseling given   Discharge Instructions  Discharge Instructions    Ambulatory referral to Neurology   Complete by: As directed    An appointment is requested in approximately: 3 weeks for TIA   Call MD for:  difficulty breathing, headache or visual disturbances   Complete by: As directed    Call  MD for:  extreme fatigue   Complete by: As directed    Call MD for:  hives   Complete by: As directed    Call MD for:  persistant dizziness or light-headedness   Complete by: As directed    Call MD for:  persistant nausea and vomiting   Complete by: As directed    Call MD for:  redness, tenderness, or signs of infection (pain, swelling, redness, odor or green/yellow discharge around incision site)   Complete by: As directed    Call MD for:  severe uncontrolled pain   Complete by: As directed    Call MD for:  temperature >100.4   Complete by: As directed    Diet - low sodium heart healthy   Complete by: As directed    Diet Carb Modified   Complete by: As directed    Discharge instructions   Complete by: As directed    You were cared for by a hospitalist during your hospital stay. If you have any questions about your discharge medications or the care you received while you were in the hospital after you are discharged, you can call the unit and ask to speak with the hospitalist on call if the hospitalist that took care of you is not available. Once you are discharged, your primary care physician will handle any further medical issues. Please note that NO REFILLS for any discharge medications will be authorized once you are discharged, as it is imperative that you return to your primary care physician (or establish a relationship with a primary care physician if  you do not have one) for your aftercare needs so that they can reassess your need for medications and monitor your lab values.  Follow up with PCP, Neurology and Cardiology. Take all medications as prescribed. If symptoms change or worsen please return to the ED for evaluation   Increase activity slowly   Complete by: As directed      Allergies as of 02/04/2020      Reactions   Beta Adrenergic Blockers Nausea And Vomiting   SOB      Medication List    STOP taking these medications   captopril 100 MG tablet Commonly known as:  CAPOTEN   lubiprostone 24 MCG capsule Commonly known as: AMITIZA   metFORMIN 500 MG tablet Commonly known as: GLUCOPHAGE   minoxidil 2.5 MG tablet Commonly known as: LONITEN   omeprazole 20 MG capsule Commonly known as: PRILOSEC   ranitidine 150 MG capsule Commonly known as: ZANTAC   verapamil 240 MG CR tablet Commonly known as: CALAN-SR     TAKE these medications   amLODipine 10 MG tablet Commonly known as: NORVASC Take 1 tablet (10 mg total) by mouth daily. What changed:   how much to take  when to take this   aspirin 81 MG EC tablet Take 1 tablet (81 mg total) by mouth daily. Swallow whole.   atorvastatin 80 MG tablet Commonly known as: LIPITOR Take 1 tablet (80 mg total) by mouth daily. What changed:   medication strength  how much to take  when to take this   clopidogrel 75 MG tablet Commonly known as: PLAVIX Take 1 tablet (75 mg total) by mouth daily.   hydrALAZINE 50 MG tablet Commonly known as: APRESOLINE Take 1 tablet (50 mg total) by mouth every 8 (eight) hours.   hydrochlorothiazide 25 MG tablet Commonly known as: HYDRODIURIL Take by mouth.   terazosin 5 MG capsule Commonly known as: HYTRIN Take 5 mg by mouth at bedtime.   vitamin B-12 1000 MCG tablet Commonly known as: CYANOCOBALAMIN Take 1,000 mcg by mouth daily.       Follow-up Information    [facility].   Why: Follow up within 1- week Contact information: [address] [place] [address] [phone]        Travis Chavez, Mark C, MD. Call.   Specialty: Cardiology Why: Follow up within 1-2 weeks for evaluation of ? 3rd Degree AV Block Contact information: [address] [place] [address] [phone]        Micki RileySethi, Pramod S, MD Follow up.   Specialties: Neurology, Radiology Why: Follow up in 3 weeks Contact information: [address] [place] [address] [phone]               Allergies  Allergen Reactions  . Beta Adrenergic Blockers Nausea And Vomiting    SOB   Consultations:  Neurology  Discussed with Dr. Anne FuSkains of Cardiology  Procedures/Studies: MR BRAIN WO CONTRAST  Result Date: 02/02/2020 CLINICAL DATA:  TIA. Left-sided weakness numbness and slurred speech. Confusion. EXAM: MRI HEAD WITHOUT CONTRAST TECHNIQUE: Multiplanar, multiecho pulse sequences of the brain and surrounding structures were obtained without intravenous contrast. COMPARISON:  CT angio head and neck 02/02/2020. FINDINGS: Brain: Negative for acute infarct. Moderate chronic microvascular ischemic change throughout the white matter bilaterally. Small chronic infarct right cerebellum. Brainstem intact. Chronic hemorrhage in the left putamen. Negative for mass lesion. Ventricle size normal. Vascular: Normal arterial flow voids. Skull and upper  cervical spine: No focal skeletal lesion. Sinuses/Orbits: Mild mucosal edema paranasal sinuses. Negative orbit Other: None IMPRESSION: Negative for acute infarct Moderate chronic microvascular ischemic change. Chronic hemorrhage left putamen. Electronically Signed   By: Marlan Palau M.D.   On: 02/02/2020 17:11   ECHOCARDIOGRAM COMPLETE  Result Date: 02/03/2020    ECHOCARDIOGRAM REPORT   Patient Name:   Travis Chavez Date of Exam: 02/03/2020 Medical Rec #:  [id number]        Height:       65.0 in Accession #:    [id number]       Weight:       230.0 lb Date of Birth:  1961-04-15         BSA:          2.099 m Patient Age:    59 years         BP:           152/99 mmHg Patient Gender: M                HR:           57 bpm. Exam Location:  Inpatient Procedure: 2D Echo Indications:    TIA 435.9  History:        Patient has no prior history of Echocardiogram examinations.  Sonographer:    Delcie Roch Referring Phys: [id number] PING T ZHANG IMPRESSIONS  1. Left ventricular ejection fraction, by estimation, is 55 to 60%. The left ventricle has normal function. The  left ventricle has no regional wall motion abnormalities. There is moderate left ventricular hypertrophy. Left ventricular diastolic parameters are indeterminate.  2. Right ventricular systolic function is normal. The right ventricular size is normal. Tricuspid regurgitation signal is inadequate for assessing PA pressure.  3. The mitral valve is grossly normal. Trivial mitral valve regurgitation.  4. The aortic valve is tricuspid. Aortic valve regurgitation is not visualized.  5. The inferior vena cava is normal in size with greater than 50% respiratory variability, suggesting right atrial pressure of 3 mmHg. FINDINGS  Left Ventricle: Left ventricular ejection fraction, by estimation, is 55 to 60%. The left ventricle has normal function. The left ventricle has no regional wall motion abnormalities. The left ventricular internal cavity size was normal in size. There is  moderate left ventricular hypertrophy. Left ventricular diastolic parameters are indeterminate. Right Ventricle: The right ventricular size is normal. No increase in right ventricular wall thickness. Right ventricular systolic function is normal. Tricuspid regurgitation signal is inadequate for assessing PA pressure. Left Atrium: Left atrial size was normal in size. Right Atrium: Right atrial size was normal in size. Pericardium: There is no evidence of pericardial effusion. Mitral Valve: The mitral valve is grossly normal. Trivial mitral valve regurgitation. Tricuspid Valve: The tricuspid valve is grossly normal. Tricuspid valve regurgitation is trivial. Aortic Valve: The aortic valve is tricuspid. Aortic valve regurgitation is not visualized. Mild aortic valve annular calcification. Pulmonic Valve: The pulmonic valve was grossly normal. Pulmonic valve regurgitation is trivial. Aorta: The aortic root is normal in size and structure. Venous: The inferior vena cava is normal in size with greater than 50% respiratory variability, suggesting right atrial  pressure of 3 mmHg. IAS/Shunts: No atrial level shunt detected by color flow Doppler.  LEFT VENTRICLE PLAX 2D LVIDd:         4.20 cm     Diastology LVIDs:         2.70 cm     LV e' lateral:   8.49  cm/s LV PW:         1.60 cm     LV E/e' lateral: 4.9 LV IVS:        1.40 cm     LV e' medial:    7.62 cm/s LVOT diam:     2.30 cm     LV E/e' medial:  5.5 LV SV:         57 LV SV Index:   27 LVOT Area:     4.15 cm  LV Volumes (MOD) LV vol d, MOD A4C: 73.5 ml LV vol s, MOD A4C: 38.4 ml LV SV MOD A4C:     73.5 ml RIGHT VENTRICLE             IVC RV S prime:     12.60 cm/s  IVC diam: 1.50 cm TAPSE (M-mode): 1.8 cm LEFT ATRIUM             Index       RIGHT ATRIUM          Index LA diam:        4.00 cm 1.91 cm/m  RA Area:     8.56 cm LA Vol (A2C):   42.1 ml 20.06 ml/m RA Volume:   14.90 ml 7.10 ml/m LA Vol (A4C):   39.5 ml 18.82 ml/m LA Biplane Vol: 41.9 ml 19.96 ml/m  AORTIC VALVE LVOT Vmax:   62.70 cm/s LVOT Vmean:  41.300 cm/s LVOT VTI:    0.136 m  AORTA Ao Root diam: 3.40 cm Ao Asc diam:  3.70 cm MITRAL VALVE MV Area (PHT): 2.50 cm    SHUNTS MV Decel Time: 303 msec    Systemic VTI:  0.14 m MV E velocity: 41.60 cm/s  Systemic Diam: 2.30 cm MV A velocity: 79.30 cm/s MV E/A ratio:  0.52 Nona Dell MD Electronically signed by Nona Dell MD Signature Date/Time: 02/03/2020/10:46:59 AM    Final    CT HEAD CODE STROKE WO CONTRAST  Result Date: 02/02/2020 CLINICAL DATA:  Code stroke.  Left-sided weakness EXAM: CT HEAD WITHOUT CONTRAST TECHNIQUE: Contiguous axial images were obtained from the base of the skull through the vertex without intravenous contrast. COMPARISON:  Outside study 2017 FINDINGS: Brain: No acute intracranial hemorrhage mass effect, or edema. No new loss of gray-white differentiation. There is a chronic infarct of the left basal ganglia and adjacent white matter. Patchy and confluent areas of hypoattenuation in the supratentorial white matter nonspecific but may reflect advanced chronic  microvascular ischemic changes greater than expected for age. No extra-axial fluid collection. Vascular: No hyperdense vessel. Skull: Unremarkable. Sinuses/Orbits: Aerated.  Orbits are unremarkable. Other: Mastoid air cells are clear. ASPECTS (Alberta Stroke Program Early CT Score) - Ganglionic level infarction (caudate, lentiform nuclei, internal capsule, insula, M1-M3 cortex): 7 - Supraganglionic infarction (M4-M6 cortex): 3 Total score (0-10 with 10 being normal): 10 IMPRESSION: No acute intracranial hemorrhage or evidence of acute infarction. ASPECT score is 10. Advanced chronic microvascular ischemic changes greater than expected for age. 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 Initial results were communicated to Dr. Otelia Limes at 11:38 am on 02/02/2020 by text page via the [facility] messaging system. Electronically Signed   By: Guadlupe Spanish M.D.   On: 02/02/2020 11:48   CT ANGIO HEAD CODE STROKE  Result Date: 02/02/2020 CLINICAL DATA:  Left-sided weakness EXAM: CT ANGIOGRAPHY HEAD AND NECK TECHNIQUE: Multidetector CT imaging of the head and neck was performed using the standard protocol during bolus administration  of intravenous contrast. Multiplanar CT image reconstructions and MIPs were obtained to evaluate the vascular anatomy. Carotid stenosis measurements (when applicable) are obtained utilizing NASCET criteria, using the distal internal carotid diameter as the denominator. CONTRAST:  48mL OMNIPAQUE IOHEXOL 350 MG/ML SOLN COMPARISON:  None. FINDINGS: CTA NECK Aortic arch: Great vessel origins are patent. Right carotid system: Patent. No measurable stenosis at the ICA origin. Left carotid system: Patent. No measurable stenosis at the ICA origin. Vertebral arteries: Patent and codominant.  No measurable stenosis. Skeleton: Mild cervical spine degenerative changes. Other neck: No mass or adenopathy. Upper chest: Cardiomegaly.  No apical lung mass. Review of the MIP images confirms  the above findings CTA HEAD Anterior circulation: Intracranial internal carotid arteries are patent. There is calcified plaque along the distal supraclinoid right ICA causing less than 50% stenosis. Anterior and middle cerebral arteries are patent. There is short segment high-grade stenosis of the proximal left M1 MCA. Posterior circulation: Intracranial vertebral arteries are patent. Mixed plaque on the left causes up to moderate stenosis. PICA origins are patent. Basilar artery is patent. Posterior cerebral arteries are patent. Bilateral posterior communicating arteries are present and there is fetal origin of the left posterior cerebral artery. There is segmental high-grade stenosis of the proximal right P2 PCA. Venous sinuses: Patent as allowed by contrast bolus timing. Review of the MIP images confirms the above findings IMPRESSION: No large vessel occlusion. No hemodynamically significant stenosis in the neck. 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 Electronically Signed   By: Guadlupe Spanish M.D.   On: 02/02/2020 11:58   CT ANGIO NECK CODE STROKE  Result Date: 02/02/2020 CLINICAL DATA:  Left-sided weakness EXAM: CT ANGIOGRAPHY HEAD AND NECK TECHNIQUE: Multidetector CT imaging of the head and neck was performed using the standard protocol during bolus administration of intravenous contrast. Multiplanar CT image reconstructions and MIPs were obtained to evaluate the vascular anatomy. Carotid stenosis measurements (when applicable) are obtained utilizing NASCET criteria, using the distal internal carotid diameter as the denominator. CONTRAST:  39mL OMNIPAQUE IOHEXOL 350 MG/ML SOLN COMPARISON:  None. FINDINGS: CTA NECK Aortic arch: Great vessel origins are patent. Right carotid system: Patent. No measurable stenosis at the ICA origin. Left carotid system: Patent. No measurable stenosis at the ICA origin. Vertebral arteries: Patent and codominant.  No measurable  stenosis. Skeleton: Mild cervical spine degenerative changes. Other neck: No mass or adenopathy. Upper chest: Cardiomegaly.  No apical lung mass. Review of the MIP images confirms the above findings CTA HEAD Anterior circulation: Intracranial internal carotid arteries are patent. There is calcified plaque along the distal supraclinoid right ICA causing less than 50% stenosis. Anterior and middle cerebral arteries are patent. There is short segment high-grade stenosis of the proximal left M1 MCA. Posterior circulation: Intracranial vertebral arteries are patent. Mixed plaque on the left causes up to moderate stenosis. PICA origins are patent. Basilar artery is patent. Posterior cerebral arteries are patent. Bilateral posterior communicating arteries are present and there is fetal origin of the left posterior cerebral artery. There is segmental high-grade stenosis of the proximal right P2 PCA. Venous sinuses: Patent as allowed by contrast bolus timing. Review of the MIP images confirms the above findings IMPRESSION: No large vessel occlusion. No hemodynamically significant stenosis in the neck. 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 Electronically Signed   By: Guadlupe Spanish M.D.   On: 02/02/2020 11:58     ECHOCARDIOGRAM IMPRESSIONS  1. Left ventricular ejection fraction, by estimation, is 55 to 60%. The  left ventricle has normal function. The left ventricle has no regional  wall motion abnormalities. There is moderate left ventricular hypertrophy.  Left ventricular diastolic  parameters are indeterminate.  2. Right ventricular systolic function is normal. The right ventricular  size is normal. Tricuspid regurgitation signal is inadequate for assessing  PA pressure.  3. The mitral valve is grossly normal. Trivial mitral valve  regurgitation.  4. The aortic valve is tricuspid. Aortic valve regurgitation is not  visualized.  5. The inferior vena  cava is normal in size with greater than 50%  respiratory variability, suggesting right atrial pressure of 3 mmHg.   FINDINGS  Left Ventricle: Left ventricular ejection fraction, by estimation, is 55  to 60%. The left ventricle has normal function. The left ventricle has no  regional wall motion abnormalities. The left ventricular internal cavity  size was normal in size. There is  moderate left ventricular hypertrophy. Left ventricular diastolic  parameters are indeterminate.   Right Ventricle: The right ventricular size is normal. No increase in  right ventricular wall thickness. Right ventricular systolic function is  normal. Tricuspid regurgitation signal is inadequate for assessing PA  pressure.   Left Atrium: Left atrial size was normal in size.   Right Atrium: Right atrial size was normal in size.   Pericardium: There is no evidence of pericardial effusion.   Mitral Valve: The mitral valve is grossly normal. Trivial mitral valve  regurgitation.   Tricuspid Valve: The tricuspid valve is grossly normal. Tricuspid valve  regurgitation is trivial.   Aortic Valve: The aortic valve is tricuspid. Aortic valve regurgitation is  not visualized. Mild aortic valve annular calcification.   Pulmonic Valve: The pulmonic valve was grossly normal. Pulmonic valve  regurgitation is trivial.   Aorta: The aortic root is normal in size and structure.   Venous: The inferior vena cava is normal in size with greater than 50%  respiratory variability, suggesting right atrial pressure of 3 mmHg.   IAS/Shunts: No atrial level shunt detected by color flow Doppler.     LEFT VENTRICLE  PLAX 2D  LVIDd:     4.20 cm   Diastology  LVIDs:     2.70 cm   LV e' lateral:  8.49 cm/s  LV PW:     1.60 cm   LV E/e' lateral: 4.9  LV IVS:    1.40 cm   LV e' medial:  7.62 cm/s  LVOT diam:   2.30 cm   LV E/e' medial: 5.5  LV SV:     57  LV SV Index:  27  LVOT Area:    4.15 cm    LV Volumes (MOD)  LV vol d, MOD A4C: 73.5 ml  LV vol s, MOD A4C: 38.4 ml  LV SV MOD A4C:   73.5 ml   RIGHT VENTRICLE       IVC  RV S prime:   12.60 cm/s IVC diam: 1.50 cm  TAPSE (M-mode): 1.8 cm   LEFT ATRIUM       Index    RIGHT ATRIUM     Index  LA diam:    4.00 cm 1.91 cm/m RA Area:   8.56 cm  LA Vol (A2C):  42.1 ml 20.06 ml/m RA Volume:  14.90 ml 7.10 ml/m  LA Vol (A4C):  39.5 ml 18.82 ml/m  LA Biplane Vol: 41.9 ml 19.96 ml/m  AORTIC VALVE  LVOT Vmax:  62.70  cm/s  LVOT Vmean: 41.300 cm/s  LVOT VTI:  0.136 m    AORTA  Ao Root diam: 3.40 cm  Ao Asc diam: 3.70 cm   MITRAL VALVE  MV Area (PHT): 2.50 cm  SHUNTS  MV Decel Time: 303 msec  Systemic VTI: 0.14 m  MV E velocity: 41.60 cm/s Systemic Diam: 2.30 cm  MV A velocity: 79.30 cm/s  MV E/A ratio: 0.52   Subjective: Seen and examined at bedside he is doing well morning.  No nausea or vomiting.  Blood pressure still little elevated but better by this afternoon.  He has no complaints and is asymptomatic and will need to follow-up with PCP, cardiology and neurology in outpatient setting and he understands agrees with the plan of care.  Discharge Exam: Vitals:   02/04/20 0738 02/04/20 1030  BP: (!) 153/115 (!) 154/109  Pulse: 75 76  Resp: 18 17  Temp: 97.7 F (36.5 C)   SpO2: 99% 98%   Vitals:   02/04/20 0427 02/04/20 0453 02/04/20 0738 02/04/20 1030  BP: (!) 152/111 (!) 142/103 (!) 153/115 (!) 154/109  Pulse: 81 75 75 76  Resp:  18 18 17   Temp: 98 F (36.7 C) 97.6 F (36.4 C) 97.7 F (36.5 C)   TempSrc: Oral Oral Oral   SpO2: 99% 98% 99% 98%  Weight:      Height:       General: Pt is alert, awake, not in acute distress Cardiovascular: RRR, S1/S2 +, no rubs, no gallops Respiratory: Diminished bilaterally, no wheezing, no rhonchi Abdominal: Soft, NT, distended secondary body habitus, bowel sounds + Extremities: no edema, no  cyanosis  The results of significant diagnostics from this hospitalization (including imaging, microbiology, ancillary and laboratory) are listed below for reference.    Microbiology: Recent Results (from the past 240 hour(s))  SARS Coronavirus 2 by RT PCR (hospital order, performed in [facility] hospital lab) Nasopharyngeal Nasopharyngeal Swab     Status: None   Collection Time: 02/02/20  5:00 PM   Specimen: Nasopharyngeal Swab  Result Value Ref Range Status   SARS Coronavirus 2 NEGATIVE NEGATIVE Final    Comment: (NOTE) SARS-CoV-2 target nucleic acids are NOT DETECTED.  The SARS-CoV-2 RNA is generally detectable in upper and lower respiratory specimens during the acute phase of infection. The lowest concentration of SARS-CoV-2 viral copies this assay can detect is 250 copies / mL. A negative result does not preclude SARS-CoV-2 infection and should not be used as the sole basis for treatment or other patient management decisions.  A negative result may occur with improper specimen collection / handling, submission of specimen other than nasopharyngeal swab, presence of viral mutation(s) within the areas targeted by this assay, and inadequate number of viral copies (<250 copies / mL). A negative result must be combined with clinical observations, patient history, and epidemiological information.  Fact Sheet for Patients:   BoilerBrush.com.cy  Fact Sheet for Healthcare Providers: https://pope.com/  This test is not yet approved or  cleared by the [place] FDA and has been authorized for detection and/or diagnosis of SARS-CoV-2 by FDA under an Emergency Use Authorization (EUA).  This EUA will remain in effect (meaning this test can be used) for the duration of the COVID-19 declaration under Section 564(b)(1) of the Act, 21 U.S.C. section 360bbb-3(b)(1), unless the authorization is terminated or revoked sooner.  Performed at  [facility], [address], [place], [place] [address]      Labs: BNP (last 3 results)  No results for input(s): BNP in the last 8760 hours. Basic Metabolic Panel: Recent Labs  Lab 02/02/20 1127 02/02/20 1131 02/03/20 0112  NA 138 140 139  K 3.1* 3.0* 3.5  CL 103 104 103  CO2 24  --  26  GLUCOSE 134* 129* 99  BUN CREATININE 1.41* 1.30* 1.22  CALCIUM 9.2  --  9.1  MG  --   --  1.8   Liver Function Tests: Recent Labs  Lab 02/02/20 1127  AST 22  ALT 20  ALKPHOS 70  BILITOT 0.5  PROT 7.2  ALBUMIN 4.1   No results for input(s): LIPASE, AMYLASE in the last 168 hours. No results for input(s): AMMONIA in the last 168 hours. CBC: Recent Labs  Lab 02/02/20 1127 02/02/20 1131 02/03/20 0112  WBC 10.0  --  9.2  NEUTROABS 5.6  --  5.6  HGB 14.7 15.6 14.0  HCT 45.9 46.0 43.1  MCV 87.9  --  87.1  PLT 331  --  315   Cardiac Enzymes: No results for input(s): CKTOTAL, CKMB, CKMBINDEX, TROPONINI in the last 168 hours. BNP: Invalid input(s): POCBNP CBG: Recent Labs  Lab 02/03/20 0602 02/03/20 1153 02/03/20 1700 02/03/20 2103 02/04/20 0613  GLUCAP 91 119* 80 109* 97   D-Dimer No results for input(s): DDIMER in the last 72 hours. Hgb A1c Recent Labs    02/03/20 0112  HGBA1C 6.0*   Lipid Profile Recent Labs    02/03/20 0112  CHOL 185  HDL 38*  LDLCALC 120*  TRIG 135  CHOLHDL 4.9   Thyroid function studies No results for input(s): TSH, T4TOTAL, T3FREE, THYROIDAB in the last 72 hours.  Invalid input(s): FREET3 Anemia work up No results for input(s): VITAMINB12, FOLATE, FERRITIN, TIBC, IRON, RETICCTPCT in the last 72 hours. Urinalysis No results found for: COLORURINE, APPEARANCEUR, LABSPEC, PHURINE, GLUCOSEU, HGBUR, BILIRUBINUR, KETONESUR, PROTEINUR, UROBILINOGEN, NITRITE, LEUKOCYTESUR Sepsis Labs Invalid input(s): PROCALCITONIN,  WBC,  LACTICIDVEN Microbiology Recent Results (from the past 240 hour(s))  SARS Coronavirus 2 by RT PCR  (hospital order, performed in [facility] hospital lab) Nasopharyngeal Nasopharyngeal Swab     Status: None   Collection Time: 02/02/20  5:00 PM   Specimen: Nasopharyngeal Swab  Result Value Ref Range Status   SARS Coronavirus 2 NEGATIVE NEGATIVE Final    Comment: (NOTE) SARS-CoV-2 target nucleic acids are NOT DETECTED.  The SARS-CoV-2 RNA is generally detectable in upper and lower respiratory specimens during the acute phase of infection. The lowest concentration of SARS-CoV-2 viral copies this assay can detect is 250 copies / mL. A negative result does not preclude SARS-CoV-2 infection and should not be used as the sole basis for treatment or other patient management decisions.  A negative result may occur with improper specimen collection / handling, submission of specimen other than nasopharyngeal swab, presence of viral mutation(s) within the areas targeted by this assay, and inadequate number of viral copies (<250 copies / mL). A negative result must be combined with clinical observations, patient history, and epidemiological information.  Fact Sheet for Patients:   BoilerBrush.com.cy  Fact Sheet for Healthcare Providers: https://pope.com/  This test is not yet approved or  cleared by the [place] FDA and has been authorized for detection and/or diagnosis of SARS-CoV-2 by FDA under an Emergency Use Authorization (EUA).  This EUA will remain in effect (meaning this test can be used) for the duration of the COVID-19 declaration under Section 564(b)(1) of the Act, 21 U.S.C. section 360bbb-3(b)(1),  unless the authorization is terminated or revoked sooner.  Performed at [facility], [address], [place], [place] [address]    Time coordinating discharge:  35 minutes  SIGNED:  Merlene Laughter, DO [facility] 02/04/2020, 11:13 AM Pager is on AMION  If 7PM-7AM, please contact  night-coverage [email or web address]

## 2020-02-04 NOTE — TOC Transition Note (Signed)
Transition of Care [facility]) - CM/SW Discharge Note   Patient Details  Name: IRVAN TIEDT MRN: [id number] Date of Birth: 1960-07-13  Transition of Care [facility] CM/SW Contact:  Kermit Balo, RN Phone Number: 02/04/2020, 11:18 AM   Clinical Narrative:    Pt discharging home with self care. No f/u per PT/OT and no DME needs.  Pt has: walker, cane, shower seat, 3 in 1 at home.  Pt and spouse deny any issues with home medications or transportation. Wife is at the bedside and can transport pt home.    Final next level of care: Home/Self Care Barriers to Discharge: No Barriers Identified   Patient Goals and CMS Choice        Discharge Placement                       Discharge Plan and Services                                     Social Determinants of Health (SDOH) Interventions     Readmission Risk Interventions No flowsheet data found.

## 2020-02-04 NOTE — Plan of Care (Signed)
Adequate for discharge.

## 2020-02-14 ENCOUNTER — Ambulatory Visit: Payer: Medicaid Other | Admitting: Cardiology

## 2020-03-12 ENCOUNTER — Ambulatory Visit: Payer: Medicaid Other | Admitting: Adult Health

## 2020-03-12 ENCOUNTER — Encounter: Payer: Self-pay | Admitting: Adult Health

## 2020-03-12 VITALS — BP 130/82 | HR 83 | Ht 65.0 in | Wt 237.2 lb

## 2020-03-12 DIAGNOSIS — E785 Hyperlipidemia, unspecified: Secondary | ICD-10-CM

## 2020-03-12 DIAGNOSIS — R7303 Prediabetes: Secondary | ICD-10-CM

## 2020-03-12 DIAGNOSIS — G459 Transient cerebral ischemic attack, unspecified: Secondary | ICD-10-CM

## 2020-03-12 DIAGNOSIS — Z8679 Personal history of other diseases of the circulatory system: Secondary | ICD-10-CM

## 2020-03-12 DIAGNOSIS — I10 Essential (primary) hypertension: Secondary | ICD-10-CM

## 2020-03-12 NOTE — Progress Notes (Signed)
I agree with the above plan 

## 2020-03-12 NOTE — Patient Instructions (Addendum)
Continue aspirin 81 mg daily  and atorvastatin 80 mg daily for secondary stroke prevention Discontinue Plavix (clopidogrel) as 3 weeks of aspirin and Plavix combination completed  Continue to follow up with PCP/cardiology regarding cholesterol and blood pressure management  Maintain strict control of hypertension with blood pressure goal below 130/90 and cholesterol with LDL cholesterol (bad cholesterol) goal below 70 mg/dL.     Followup in the future with me in 4 months or call earlier if needed       Thank you for coming to see Travis Chavez at [facility]. I hope we have been able to provide you high quality care today.  You may receive a patient satisfaction survey over the next few weeks. We would appreciate your feedback and comments so that we may continue to improve ourselves and the health of our patients.   Stroke Prevention Some medical conditions and behaviors are associated with a higher chance of having a stroke. You can help prevent a stroke by making nutrition, lifestyle, and other changes, including managing any medical conditions you may have. What nutrition changes can be made?   Eat healthy foods. You can do this by: ? Choosing foods high in fiber, such as fresh fruits and vegetables and whole grains. ? Eating at least 5 or more servings of fruits and vegetables a day. Try to fill half of your plate at each meal with fruits and vegetables. ? Choosing lean protein foods, such as lean cuts of meat, poultry without skin, fish, tofu, beans, and nuts. ? Eating low-fat dairy products. ? Avoiding foods that are high in salt (sodium). This can help lower blood pressure. ? Avoiding foods that have saturated fat, trans fat, and cholesterol. This can help prevent high cholesterol. ? Avoiding processed and premade foods.  Follow your health care provider's specific guidelines for losing weight, controlling high blood pressure (hypertension), lowering high  cholesterol, and managing diabetes. These may include: ? Reducing your daily calorie intake. ? Limiting your daily sodium intake to 1,500 milligrams (mg). ? Using only healthy fats for cooking, such as olive oil, canola oil, or sunflower oil. ? Counting your daily carbohydrate intake. What lifestyle changes can be made?  Maintain a healthy weight. Talk to your health care provider about your ideal weight.  Get at least 30 minutes of moderate physical activity at least 5 days a week. Moderate activity includes brisk walking, biking, and swimming.  Do not use any products that contain nicotine or tobacco, such as cigarettes and e-cigarettes. If you need help quitting, ask your health care provider. It may also be helpful to avoid exposure to secondhand smoke.  Limit alcohol intake to no more than 1 drink a day for nonpregnant women and 2 drinks a day for men. One drink equals 12 oz of beer, 5 oz of wine, or 1 oz of hard liquor.  Stop any illegal drug use.  Avoid taking birth control pills. Talk to your health care provider about the risks of taking birth control pills if: ? You are over 35 years old. ? You smoke. ? You get migraines. ? You have ever had a blood clot. What other changes can be made?  Manage your cholesterol levels. ? Eating a healthy diet is important for preventing high cholesterol. If cholesterol cannot be managed through diet alone, you may also need to take medicines. ? Take any prescribed medicines to control your cholesterol as told by your health care provider.  Manage your diabetes. ? Eating a  healthy diet and exercising regularly are important parts of managing your blood sugar. If your blood sugar cannot be managed through diet and exercise, you may need to take medicines. ? Take any prescribed medicines to control your diabetes as told by your health care provider.  Control your hypertension. ? To reduce your risk of stroke, try to keep your blood pressure  below 130/80. ? Eating a healthy diet and exercising regularly are an important part of controlling your blood pressure. If your blood pressure cannot be managed through diet and exercise, you may need to take medicines. ? Take any prescribed medicines to control hypertension as told by your health care provider. ? Ask your health care provider if you should monitor your blood pressure at home. ? Have your blood pressure checked every year, even if your blood pressure is normal. Blood pressure increases with age and some medical conditions.  Get evaluated for sleep disorders (sleep apnea). Talk to your health care provider about getting a sleep evaluation if you snore a lot or have excessive sleepiness.  Take over-the-counter and prescription medicines only as told by your health care provider. Aspirin or blood thinners (antiplatelets or anticoagulants) may be recommended to reduce your risk of forming blood clots that can lead to stroke.  Make sure that any other medical conditions you have, such as atrial fibrillation or atherosclerosis, are managed. What are the warning signs of a stroke? The warning signs of a stroke can be easily remembered as BEFAST.  B is for balance. Signs include: ? Dizziness. ? Loss of balance or coordination. ? Sudden trouble walking.  E is for eyes. Signs include: ? A sudden change in vision. ? Trouble seeing.  F is for face. Signs include: ? Sudden weakness or numbness of the face. ? The face or eyelid drooping to one side.  A is for arms. Signs include: ? Sudden weakness or numbness of the arm, usually on one side of the body.  S is for speech. Signs include: ? Trouble speaking (aphasia). ? Trouble understanding.  T is for time. ? These symptoms may represent a serious problem that is an emergency. Do not wait to see if the symptoms will go away. Get medical help right away. Call your local emergency services (911 in the [place] Do not drive yourself  to the hospital.  Other signs of stroke may include: ? A sudden, severe headache with no known cause. ? Nausea or vomiting. ? Seizure. Where to find more information For more information, visit:  American Stroke Association: www.strokeassociation.org  National Stroke Association: www.stroke.org Summary  You can prevent a stroke by eating healthy, exercising, not smoking, limiting alcohol intake, and managing any medical conditions you may have.  Do not use any products that contain nicotine or tobacco, such as cigarettes and e-cigarettes. If you need help quitting, ask your health care provider. It may also be helpful to avoid exposure to secondhand smoke.  Remember BEFAST for warning signs of stroke. Get help right away if you or a loved one has any of these signs. This information is not intended to replace advice given to you by your health care provider. Make sure you discuss any questions you have with your health care provider. Document Revised: 05/06/2017 Document Reviewed: 06/29/2016 Elsevier Patient Education  2020 [employer].
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
## 2020-03-25 ENCOUNTER — Other Ambulatory Visit: Payer: Self-pay

## 2020-03-25 ENCOUNTER — Ambulatory Visit (INDEPENDENT_AMBULATORY_CARE_PROVIDER_SITE_OTHER): Payer: Medicaid Other | Admitting: Cardiology

## 2020-03-25 ENCOUNTER — Encounter: Payer: Self-pay | Admitting: Cardiology

## 2020-03-25 VITALS — BP 130/80 | HR 88 | Ht 65.0 in | Wt 233.8 lb

## 2020-03-25 DIAGNOSIS — G459 Transient cerebral ischemic attack, unspecified: Secondary | ICD-10-CM | POA: Diagnosis not present

## 2020-03-25 DIAGNOSIS — Z79899 Other long term (current) drug therapy: Secondary | ICD-10-CM

## 2020-03-25 DIAGNOSIS — R609 Edema, unspecified: Secondary | ICD-10-CM

## 2020-03-25 MED ORDER — FUROSEMIDE 20 MG PO TABS: 20.0000 mg | ORAL_TABLET | Freq: Every day | ORAL | 3 refills | Status: DC

## 2020-03-25 NOTE — Patient Instructions (Signed)
Medication Instructions:  Start Furosemide 20 mg a day. Continue all other medications as listed.  *If you need a refill on your cardiac medications before your next appointment, please call your pharmacy*  Lab Work: Please have blood work in 2 weeks (BMP)  If you have labs (blood work) drawn today and your tests are completely normal, you will receive your results only by: Marland Kitchen MyChart Message (if you have MyChart) OR . A paper copy in the mail If you have any lab test that is abnormal or we need to change your treatment, we will call you to review the results.  Follow-Up: At [facility], you and your health needs are our priority.  As part of our continuing mission to provide you with exceptional heart care, we have created designated Provider Care Teams.  These Care Teams include your primary Cardiologist (physician) and Advanced Practice Providers (APPs -  Physician Assistants and Nurse Practitioners) who all work together to provide you with the care you need, when you need it.  We recommend signing up for the patient portal called "MyChart".  Sign up information is provided on this After Visit Summary.  MyChart is used to connect with patients for Virtual Visits (Telemedicine).  Patients are able to view lab/test results, encounter notes, upcoming appointments, etc.  Non-urgent messages can be sent to your provider as well.   To learn more about what you can do with MyChart, go to [email or web address].    Your next appointment:   2 week(s)  The format for your next appointment:   In Person  Provider:   Donato Schultz, MD   Thank you for choosing [facility]!!

## 2020-03-25 NOTE — Progress Notes (Signed)
Cardiology Office Note:    Date:  03/25/2020   ID:  Travis Chavez, DOB 04-26-1961, MRN [id number]  PCP:  [facility] Personal  [facility] Cardiologist:  Donato Schultz, MD  [facility] Electrophysiologist:  None   Referring MD: Merlene Laughter, DO     History of Present Illness:    Travis Chavez is a 59 y.o. male here for evaluation of transient third-degree heart block at the request of Dr. Marland Mcalpine.  He was discharged from the hospital on 02/04/2020 after TIA-like episode with highly elevated blood pressures.  Confusion noted.  His verapamil was held because of bradycardia noted in the hospital setting.  Echocardiogram was normal.  Dual antiplatelet therapy with Plavix and aspirin started with aspirin alone at the beginning of October.  I personally reviewed his cardiac monitoring strips from the hospital setting and there are 2 separate strips with what looks like a prolonging PR interval then a dropped QRS, then nonconducted P wave.  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  No syncope no orthopnea no PND no bleeding.  Overall he has been doing well post hospital.  His blood pressure is under much better control.  Medication as below.  He has noted some lower extremity edema.  This is likely amlodipine related.  See below.     Past Medical History:  Diagnosis Date  . Hypertension   . Stroke [facility])     No past surgical history on file.  Current Medications: Current Meds  Medication Sig  . amLODipine (NORVASC) 5 MG tablet Take 5 mg by mouth 2 (two) times daily.  Marland Kitchen aspirin EC 81 MG EC tablet Take 1 tablet (81 mg total) by mouth daily. Swallow whole.  Marland Kitchen atorvastatin (LIPITOR) 80 MG tablet Take 1 tablet (80 mg total) by mouth daily.  . hydrALAZINE (APRESOLINE) 50 MG tablet Take 1 tablet (50 mg total) by mouth every 8 (eight) hours.  . hydrochlorothiazide (HYDRODIURIL) 25 MG tablet Take 1 tablet (25 mg total) by  mouth daily.  Marland Kitchen terazosin (HYTRIN) 2 MG capsule Take 2 mg by mouth 2 (two) times daily.  . vitamin B-12 (CYANOCOBALAMIN) 1000 MCG tablet Take 1,000 mcg by mouth daily.     Allergies:   Beta adrenergic blockers   Social History   Socioeconomic History  . Marital status: Married    Spouse name: Not on file  . Number of children: Not on file  . Years of education: Not on file  . Highest education level: Not on file  Occupational History  . Not on file  Tobacco Use  . Smoking status: Never Smoker  . Smokeless tobacco: Never Used  Substance and Sexual Activity  . Alcohol use: No    Alcohol/week: 0.0 standard drinks  . Drug use: No  . Sexual activity: Not on file  Other Topics Concern  . Not on file  Social History Narrative  . Not on file   Social Determinants of Health   Financial Resource Strain:   . Difficulty of Paying Living Expenses: Not on file  Food Insecurity:   . Worried About Programme researcher, broadcasting/film/video in the Last Year: Not on file  . Ran Out of Food in the Last Year: Not on file  Transportation Needs:   . Lack of Transportation (Medical): Not on file  . Lack of Transportation (Non-Medical): Not on file  Physical Activity:   . Days of Exercise per Week: Not on file  .  Minutes of Exercise per Session: Not on file  Stress:   . Feeling of Stress : Not on file  Social Connections:   . Frequency of Communication with Friends and Family: Not on file  . Frequency of Social Gatherings with Friends and Family: Not on file  . Attends Religious Services: Not on file  . Active Member of Clubs or Organizations: Not on file  . Attends Banker Meetings: Not on file  . Marital Status: Not on file     Family History: The patient's mother and father had strokes  ROS:   Please see the history of present illness.    No syncope, no chest pain, no bleeding.  All other systems reviewed and are negative.  EKGs/Labs/Other Studies Reviewed:    The following studies  were reviewed today:  ECHO 01/2020   1. Left ventricular ejection fraction, by estimation, is 55 to 60%. The  left ventricle has normal function. The left ventricle has no regional  wall motion abnormalities. There is moderate left ventricular hypertrophy.  Left ventricular diastolic  parameters are indeterminate.  2. Right ventricular systolic function is normal. The right ventricular  size is normal. Tricuspid regurgitation signal is inadequate for assessing  PA pressure.  3. The mitral valve is grossly normal. Trivial mitral valve  regurgitation.  4. The aortic valve is tricuspid. Aortic valve regurgitation is not  visualized.  5. The inferior vena cava is normal in size with greater than 50%  respiratory variability, suggesting right atrial pressure of 3 mmHg.   EKG: Hospital telemetry as well as prior hospital EKGs reviewed.  See below for details.  Recent Labs: 02/02/2020: ALT 20 02/03/2020: BUN 18; Creatinine, Ser 1.22; Hemoglobin 14.0; Magnesium 1.8; Platelets 315; Potassium 3.5; Sodium 139  Recent Lipid Panel    Component Value Date/Time   CHOL 185 02/03/2020 0112   TRIG 135 02/03/2020 0112   HDL 38 (L) 02/03/2020 0112   CHOLHDL 4.9 02/03/2020 0112   VLDL 27 02/03/2020 0112   LDLCALC 120 (H) 02/03/2020 0112     Risk Assessment/Calculations:       Physical Exam:    VS:  BP 130/80   Pulse 88   Ht 5\' 5"  (1.651 m)   Wt 233 lb 12.8 oz (106.1 kg)   SpO2 95%   BMI 38.91 kg/m     Wt Readings from Last 3 Encounters:  03/25/20 233 lb 12.8 oz (106.1 kg)  03/12/20 237 lb 3.2 oz (107.6 kg)  02/02/20 230 lb (104.3 kg)     GEN:  Well nourished, well developed in no acute distress HEENT: Normal NECK: No JVD; No carotid bruits LYMPHATICS: No lymphadenopathy CARDIAC: RRR, no murmurs, rubs, gallops RESPIRATORY:  Clear to auscultation without rales, wheezing or rhonchi  ABDOMEN: Soft, non-tender, non-distended MUSCULOSKELETAL:  No edema; No deformity  SKIN:  Warm and dry NEUROLOGIC:  Alert and oriented x 3 PSYCHIATRIC:  Normal affect   ASSESSMENT:    1. Medication management   2. Edema, unspecified type   3. TIA (transient ischemic attack)    PLAN:    In order of problems listed above:  Transient second-degree heart block type I-recent TIA -His cardiac strips appear to be demonstrating mostly Wenckebach type phenomenon with lengthening PR segments, possibly mediated by increased vagal tone and AV delay.  There was 1 junctional escape.  No significant pauses or bradycardia.  No syncope.  I think it is a good idea for him not to have AV nodal blocking  agents such as verapamil, carvedilol.  Does not appear to require pacemaker.  Essential hypertension -Continued management per primary physician.  Lower extremity edema -Likely exacerbated by amlodipine which is a new start for him.  I do like the amlodipine which seems to be working well with his blood pressure.  Conservative measures such as leg elevation are helping but not enough.  I will give him low-dose furosemide 20 mg a day.  Potassium in the hospital was 3.5 on discharge.  He may need potassium supplementation.  We will go ahead and repeat basic metabolic profile in 14 days.  Add potassium as needed.  We will continue with his HCTZ 25 mg.  Watch his sodium as well.  Remember this can be a potent diuretic combination.  See him back in 2 weeks.  Check basic metabolic profile.   Medication Adjustments/Labs and Tests Ordered: Current medicines are reviewed at length with the patient today.  Concerns regarding medicines are outlined above.  Orders Placed This Encounter  Procedures  . Basic metabolic panel   Meds ordered this encounter  Medications  . furosemide (LASIX) 20 MG tablet    Sig: Take 1 tablet (20 mg total) by mouth daily.    Dispense:  90 tablet    Refill:  3    Patient Instructions  Medication Instructions:  Start Furosemide 20 mg a day. Continue all other  medications as listed.  *If you need a refill on your cardiac medications before your next appointment, please call your pharmacy*  Lab Work: Please have blood work in 2 weeks (BMP)  If you have labs (blood work) drawn today and your tests are completely normal, you will receive your results only by: Marland Kitchen MyChart Message (if you have MyChart) OR . A paper copy in the mail If you have any lab test that is abnormal or we need to change your treatment, we will call you to review the results.  Follow-Up: At [facility], you and your health needs are our priority.  As part of our continuing mission to provide you with exceptional heart care, we have created designated Provider Care Teams.  These Care Teams include your primary Cardiologist (physician) and Advanced Practice Providers (APPs -  Physician Assistants and Nurse Practitioners) who all work together to provide you with the care you need, when you need it.  We recommend signing up for the patient portal called "MyChart".  Sign up information is provided on this After Visit Summary.  MyChart is used to connect with patients for Virtual Visits (Telemedicine).  Patients are able to view lab/test results, encounter notes, upcoming appointments, etc.  Non-urgent messages can be sent to your provider as well.   To learn more about what you can do with MyChart, go to [email or web address].    Your next appointment:   2 week(s)  The format for your next appointment:   In Person  Provider:   Donato Schultz, MD   Thank you for choosing [facility]!!         Signed, Donato Schultz, MD  03/25/2020 3:37 PM    [facility]

## 2020-04-08 ENCOUNTER — Other Ambulatory Visit: Payer: Self-pay

## 2020-04-08 ENCOUNTER — Ambulatory Visit: Payer: Medicaid Other | Admitting: Cardiology

## 2020-04-08 ENCOUNTER — Encounter: Payer: Self-pay | Admitting: Cardiology

## 2020-04-08 VITALS — BP 128/70 | HR 81 | Ht 65.0 in | Wt 233.0 lb

## 2020-04-08 DIAGNOSIS — R609 Edema, unspecified: Secondary | ICD-10-CM | POA: Diagnosis not present

## 2020-04-08 DIAGNOSIS — Z79899 Other long term (current) drug therapy: Secondary | ICD-10-CM | POA: Diagnosis not present

## 2020-04-08 DIAGNOSIS — G459 Transient cerebral ischemic attack, unspecified: Secondary | ICD-10-CM | POA: Diagnosis not present

## 2020-04-08 LAB — BASIC METABOLIC PANEL
BUN/Creatinine Ratio: 11 (ref 9–20)
BUN: 12 mg/dL (ref 6–24)
CO2: 22 mmol/L (ref 20–29)
Calcium: 9.3 mg/dL (ref 8.7–10.2)
Chloride: 103 mmol/L (ref 96–106)
Creatinine, Ser: 1.13 mg/dL (ref 0.76–1.27)
GFR calc Af Amer: 83 mL/min/{1.73_m2} (ref >59)
GFR calc non Af Amer: 72 mL/min/{1.73_m2} (ref >59)
Glucose: 101 mg/dL — ABNORMAL HIGH (ref 65–99)
Potassium: 3.7 mmol/L (ref 3.5–5.2)
Sodium: 140 mmol/L (ref 134–144)

## 2020-04-08 NOTE — Patient Instructions (Signed)
Medication Instructions:  The current medical regimen is effective;  continue present plan and medications.  *If you need a refill on your cardiac medications before your next appointment, please call your pharmacy*  Lab Work: Please have blood work today (BMP)  If you have labs (blood work) drawn today and your tests are completely normal, you will receive your results only by: MyChart Message (if you have MyChart) OR A paper copy in the mail If you have any lab test that is abnormal or we need to change your treatment, we will call you to review the results.  Follow-Up: At [facility], you and your health needs are our priority.  As part of our continuing mission to provide you with exceptional heart care, we have created designated Provider Care Teams.  These Care Teams include your primary Cardiologist (physician) and Advanced Practice Providers (APPs -  Physician Assistants and Nurse Practitioners) who all work together to provide you with the care you need, when you need it.  We recommend signing up for the patient portal called "MyChart".  Sign up information is provided on this After Visit Summary.  MyChart is used to connect with patients for Virtual Visits (Telemedicine).  Patients are able to view lab/test results, encounter notes, upcoming appointments, etc.  Non-urgent messages can be sent to your provider as well.   To learn more about what you can do with MyChart, go to [email or web address].    Your next appointment:   6 month(s)  The format for your next appointment:   In Person  Provider:   Mark Skains, MD  Thank you for choosing [facility]!!    

## 2020-04-08 NOTE — Progress Notes (Signed)
Cardiology Office Note:    Date:  04/08/2020   ID:  Travis Chavez, DOB [birth year], MRN [id number]  PCP:  Fanny Dance, FNP  [facility] Cardiologist:  Donato Schultz, MD  [facility] Electrophysiologist:  None   Referring MD: [facility], [facility] *     History of Present Illness:    Travis Chavez is a 59 y.o. 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  I saw him last on 03/25/2020.  2 weeks ago I started him on Lasix 20 mg a day with his HCTZ 25 because of lower extremity edema that may have been exacerbated by his amlodipine as well.  He seems to be doing much better with a swelling standpoint with the Lasix.  His blood pressure is also under very good control currently.  No syncope.  He will occasionally feel some cramping.  He was discharged from the hospital on 02/04/2020 after TIA and high elevated blood pressures.  Confusion.  Verapamil was held because of bradycardia.  Echo was normal.  Plavix and aspirin was started with aspirin alone in October given possible TIA. -There was concerned originally about complete heart block but after reviewing the cardiac monitoring strips there are prolonging PR intervals with dropped QRS compatible with second-degree heart block type I.   Past Medical History:  Diagnosis Date  . Hypertension   . Stroke [facility])     No past surgical history on file.  Current Medications: Current Meds  Medication Sig  . amLODipine (NORVASC) 5 MG tablet Take 5 mg by mouth 2 (two) times daily.  Marland Kitchen aspirin EC 81 MG EC tablet Take 1 tablet (81 mg total) by mouth daily. Swallow whole.  Marland Kitchen atorvastatin (LIPITOR) 80 MG tablet Take 1 tablet (80 mg total) by mouth daily.  . furosemide (LASIX) 20 MG tablet Take 1 tablet (20 mg total) by mouth daily.  . hydrALAZINE (APRESOLINE) 50 MG tablet Take 1 tablet (50 mg total) by mouth every 8 (eight) hours.  . hydrochlorothiazide (HYDRODIURIL) 25 MG tablet Take 1 tablet  (25 mg total) by mouth daily.  Marland Kitchen terazosin (HYTRIN) 2 MG capsule Take 2 mg by mouth 2 (two) times daily.  . vitamin B-12 (CYANOCOBALAMIN) 1000 MCG tablet Take 1,000 mcg by mouth daily.     Allergies:   Beta adrenergic blockers   Social History   Socioeconomic History  . Marital status: Married    Spouse name: Not on file  . Number of children: Not on file  . Years of education: Not on file  . Highest education level: Not on file  Occupational History  . Not on file  Tobacco Use  . Smoking status: Never Smoker  . Smokeless tobacco: Never Used  Substance and Sexual Activity  . Alcohol use: No    Alcohol/week: 0.0 standard drinks  . Drug use: No  . Sexual activity: Not on file  Other Topics Concern  . Not on file  Social History Narrative  . Not on file   Social Determinants of Health   Financial Resource Strain:   . Difficulty of Paying Living Expenses: Not on file  Food Insecurity:   . Worried About Programme researcher, broadcasting/film/video in the Last Year: Not on file  . Ran Out of Food in the Last Year: Not on file  Transportation Needs:   . Lack of Transportation (Medical): Not on file  . Lack of Transportation (Non-Medical): Not on file  Physical Activity:   .  Days of Exercise per Week: Not on file  . Minutes of Exercise per Session: Not on file  Stress:   . Feeling of Stress : Not on file  Social Connections:   . Frequency of Communication with Friends and Family: Not on file  . Frequency of Social Gatherings with Friends and Family: Not on file  . Attends Religious Services: Not on file  . Active Member of Clubs or Organizations: Not on file  . Attends Banker Meetings: Not on file  . Marital Status: Not on file    ROS:   Please see the history of present illness.    Denies any fevers chills nausea vomiting syncope bleeding all other systems reviewed and are negative.  EKGs/Labs/Other Studies Reviewed:    The following studies were reviewed today:  ECHO  01/2020   1. Left ventricular ejection fraction, by estimation, is 55 to 60%. The  left ventricle has normal function. The left ventricle has no regional  wall motion abnormalities. There is moderate left ventricular hypertrophy.  Left ventricular diastolic  parameters are indeterminate.  2. Right ventricular systolic function is normal. The right ventricular  size is normal. Tricuspid regurgitation signal is inadequate for assessing  PA pressure.  3. The mitral valve is grossly normal. Trivial mitral valve  regurgitation.  4. The aortic valve is tricuspid. Aortic valve regurgitation is not  visualized.  5. The inferior vena cava is normal in size with greater than 50%  respiratory variability, suggesting right atrial pressure of 3 mmHg.     Recent Labs: 02/02/2020: ALT 20 02/03/2020: BUN 18; Creatinine, Ser 1.22; Hemoglobin 14.0; Magnesium 1.8; Platelets 315; Potassium 3.5; Sodium 139  Recent Lipid Panel    Component Value Date/Time   CHOL 185 02/03/2020 0112   TRIG 135 02/03/2020 0112   HDL 38 (L) 02/03/2020 0112   CHOLHDL 4.9 02/03/2020 0112   VLDL 27 02/03/2020 0112   LDLCALC 120 (H) 02/03/2020 0112     Physical Exam:    VS:  BP 128/70   Pulse 81   Ht 5\' 5"  (1.651 m)   Wt 233 lb (105.7 kg)   SpO2 97%   BMI 38.77 kg/m     Wt Readings from Last 3 Encounters:  04/08/20 233 lb (105.7 kg)  03/25/20 233 lb 12.8 oz (106.1 kg)  03/12/20 237 lb 3.2 oz (107.6 kg)     GEN:  Well nourished, well developed in no acute distress HEENT: Normal NECK: No JVD; No carotid bruits LYMPHATICS: No lymphadenopathy CARDIAC: RRR, no murmurs, rubs, gallops RESPIRATORY:  Clear to auscultation without rales, wheezing or rhonchi  ABDOMEN: Soft, non-tender, non-distended MUSCULOSKELETAL: 1+ lower edema; improved edema no deformity  SKIN: Warm and dry NEUROLOGIC:  Alert and oriented x 3 PSYCHIATRIC:  Normal affect   ASSESSMENT:    1. Edema, unspecified type   2. TIA (transient  ischemic attack)   3. Medication management    PLAN:    In order of problems listed above:  Transient second-degree heart block type I, Wenckebach with recent TIA -He is off of verapamil.  Off of carvedilol.  No significant pauses. No syncope.  Essential hypertension -He has been very pleased.  His wife states that this is the lowest blood pressure she seen in years.  Lower extremity edema -He was given low-dose furosemide 20 mg a day.  Remember potassium was 3.5 on discharge from hospital.  Continuing with HCTZ 25 combination.  On repeat, potassium was 3.5 creatinine was 1.2-1.4. -  We will recheck basic metabolic profile today.  He may need potassium supplementation if less than 4.  He is having some minor cramping.  Hyperlipidemia -LDL 120, goal < 70. Repeat lipids at follow up. -Continue high intensity statin  Medication Adjustments/Labs and Tests Ordered: Current medicines are reviewed at length with the patient today.  Concerns regarding medicines are outlined above.  Orders Placed This Encounter  Procedures  . Basic metabolic panel   No orders of the defined types were placed in this encounter.   Patient Instructions  Medication Instructions:  The current medical regimen is effective;  continue present plan and medications.  *If you need a refill on your cardiac medications before your next appointment, please call your pharmacy*  Lab Work: Please have blood work today (BMP)  If you have labs (blood work) drawn today and your tests are completely normal, you will receive your results only by: Marland Kitchen MyChart Message (if you have MyChart) OR . A paper copy in the mail If you have any lab test that is abnormal or we need to change your treatment, we will call you to review the results.  Follow-Up: At [facility], you and your health needs are our priority.  As part of our continuing mission to provide you with exceptional heart care, we have created designated Provider  Care Teams.  These Care Teams include your primary Cardiologist (physician) and Advanced Practice Providers (APPs -  Physician Assistants and Nurse Practitioners) who all work together to provide you with the care you need, when you need it.  We recommend signing up for the patient portal called "MyChart".  Sign up information is provided on this After Visit Summary.  MyChart is used to connect with patients for Virtual Visits (Telemedicine).  Patients are able to view lab/test results, encounter notes, upcoming appointments, etc.  Non-urgent messages can be sent to your provider as well.   To learn more about what you can do with MyChart, go to [email or web address].    Your next appointment:   6 month(s)  The format for your next appointment:   In Person  Provider:   Donato Schultz, MD   Thank you for choosing [facility]!!        Signed, Donato Schultz, MD  04/08/2020 11:24 AM    [facility]

## 2020-04-10 ENCOUNTER — Telehealth: Payer: Self-pay | Admitting: *Deleted

## 2020-04-10 DIAGNOSIS — R609 Edema, unspecified: Secondary | ICD-10-CM

## 2020-04-10 DIAGNOSIS — Z79899 Other long term (current) drug therapy: Secondary | ICD-10-CM

## 2020-04-10 MED ORDER — POTASSIUM CHLORIDE CRYS ER 20 MEQ PO TBCR: 20.0000 meq | EXTENDED_RELEASE_TABLET | Freq: Every day | ORAL | 3 refills | Status: DC

## 2020-04-10 NOTE — Telephone Encounter (Signed)
-----   Message from Jake Bathe, MD sent at 04/09/2020 10:20 AM EDT ----- Potassium currently 3.7.  Please give him potassium chloride 20 mEq daily to assist with hypokalemia.  This should help with cramping as well.  Continue current plan otherwise.  Repeat basic metabolic profile in 1 month.  Donato Schultz, MD

## 2020-04-27 ENCOUNTER — Inpatient Hospital Stay (HOSPITAL_COMMUNITY): Payer: Medicaid Other

## 2020-04-27 ENCOUNTER — Inpatient Hospital Stay (HOSPITAL_COMMUNITY)
Admission: AD | Admit: 2020-04-27 | Discharge: 2020-04-29 | DRG: 065 | Disposition: A | Discharge status: Home / Self Care | Payer: Medicaid Other | Source: Other Acute Inpatient Hospital | Attending: Internal Medicine | Admitting: Internal Medicine

## 2020-04-27 DIAGNOSIS — H539 Unspecified visual disturbance: Secondary | ICD-10-CM

## 2020-04-27 DIAGNOSIS — R200 Anesthesia of skin: Secondary | ICD-10-CM

## 2020-04-27 DIAGNOSIS — N179 Acute kidney failure, unspecified: Secondary | ICD-10-CM | POA: Diagnosis not present

## 2020-04-27 DIAGNOSIS — E785 Hyperlipidemia, unspecified: Secondary | ICD-10-CM | POA: Diagnosis present

## 2020-04-27 DIAGNOSIS — H53122 Transient visual loss, left eye: Secondary | ICD-10-CM | POA: Diagnosis present

## 2020-04-27 DIAGNOSIS — E876 Hypokalemia: Secondary | ICD-10-CM | POA: Diagnosis not present

## 2020-04-27 DIAGNOSIS — R202 Paresthesia of skin: Secondary | ICD-10-CM | POA: Diagnosis not present

## 2020-04-27 DIAGNOSIS — I639 Cerebral infarction, unspecified: Secondary | ICD-10-CM

## 2020-04-27 DIAGNOSIS — I129 Hypertensive chronic kidney disease with stage 1 through stage 4 chronic kidney disease, or unspecified chronic kidney disease: Secondary | ICD-10-CM | POA: Diagnosis present

## 2020-04-27 DIAGNOSIS — I672 Cerebral atherosclerosis: Secondary | ICD-10-CM | POA: Diagnosis not present

## 2020-04-27 DIAGNOSIS — H5462 Unqualified visual loss, left eye, normal vision right eye: Secondary | ICD-10-CM | POA: Diagnosis present

## 2020-04-27 DIAGNOSIS — Z79899 Other long term (current) drug therapy: Secondary | ICD-10-CM

## 2020-04-27 DIAGNOSIS — I63531 Cerebral infarction due to unspecified occlusion or stenosis of right posterior cerebral artery: Secondary | ICD-10-CM | POA: Diagnosis not present

## 2020-04-27 DIAGNOSIS — I10 Essential (primary) hypertension: Secondary | ICD-10-CM | POA: Diagnosis present

## 2020-04-27 DIAGNOSIS — K219 Gastro-esophageal reflux disease without esophagitis: Secondary | ICD-10-CM | POA: Diagnosis present

## 2020-04-27 DIAGNOSIS — I6330 Cerebral infarction due to thrombosis of unspecified cerebral artery: Secondary | ICD-10-CM | POA: Insufficient documentation

## 2020-04-27 DIAGNOSIS — D72829 Elevated white blood cell count, unspecified: Secondary | ICD-10-CM | POA: Diagnosis present

## 2020-04-27 DIAGNOSIS — Z6838 Body mass index (BMI) 38.0-38.9, adult: Secondary | ICD-10-CM | POA: Diagnosis not present

## 2020-04-27 DIAGNOSIS — Z20822 Contact with and (suspected) exposure to covid-19: Secondary | ICD-10-CM | POA: Diagnosis present

## 2020-04-27 DIAGNOSIS — Z7982 Long term (current) use of aspirin: Secondary | ICD-10-CM | POA: Diagnosis not present

## 2020-04-27 DIAGNOSIS — R7303 Prediabetes: Secondary | ICD-10-CM | POA: Diagnosis present

## 2020-04-27 DIAGNOSIS — N182 Chronic kidney disease, stage 2 (mild): Secondary | ICD-10-CM | POA: Diagnosis not present

## 2020-04-27 DIAGNOSIS — E669 Obesity, unspecified: Secondary | ICD-10-CM | POA: Diagnosis not present

## 2020-04-27 DIAGNOSIS — Z888 Allergy status to other drugs, medicaments and biological substances status: Secondary | ICD-10-CM | POA: Diagnosis not present

## 2020-04-27 DIAGNOSIS — I6389 Other cerebral infarction: Secondary | ICD-10-CM

## 2020-04-27 DIAGNOSIS — I63532 Cerebral infarction due to unspecified occlusion or stenosis of left posterior cerebral artery: Secondary | ICD-10-CM

## 2020-04-27 LAB — CBC WITH DIFFERENTIAL/PLATELET
Abs Immature Granulocytes: 0.03 10*3/uL (ref 0.00–0.07)
Basophils Absolute: 0 10*3/uL (ref 0.0–0.1)
Basophils Relative: 0 %
Eosinophils Absolute: 0 10*3/uL (ref 0.0–0.5)
Eosinophils Relative: 0 %
HCT: 40.9 % (ref 39.0–52.0)
Hemoglobin: 13.4 g/dL (ref 13.0–17.0)
Immature Granulocytes: 0 %
Lymphocytes Relative: 25 %
Lymphs Abs: 2.6 10*3/uL (ref 0.7–4.0)
MCH: 28.8 pg (ref 26.0–34.0)
MCHC: 32.8 g/dL (ref 30.0–36.0)
MCV: 87.8 fL (ref 80.0–100.0)
Monocytes Absolute: 0.8 10*3/uL (ref 0.1–1.0)
Monocytes Relative: 8 %
Neutro Abs: 6.9 10*3/uL (ref 1.7–7.7)
Neutrophils Relative %: 67 %
Platelets: 308 10*3/uL (ref 150–400)
RBC: 4.66 MIL/uL (ref 4.22–5.81)
RDW: 12.8 % (ref 11.5–15.5)
WBC: 10.4 10*3/uL (ref 4.0–10.5)
nRBC: 0 % (ref 0.0–0.2)

## 2020-04-27 LAB — COMPREHENSIVE METABOLIC PANEL
ALT: 17 U/L (ref 0–44)
AST: 16 U/L (ref 15–41)
Albumin: 3.4 g/dL — ABNORMAL LOW (ref 3.5–5.0)
Alkaline Phosphatase: 58 U/L (ref 38–126)
Anion gap: 9 (ref 5–15)
BUN: 18 mg/dL (ref 6–20)
CO2: 25 mmol/L (ref 22–32)
Calcium: 8.6 mg/dL — ABNORMAL LOW (ref 8.9–10.3)
Chloride: 107 mmol/L (ref 98–111)
Creatinine, Ser: 1.17 mg/dL (ref 0.61–1.24)
GFR, Estimated: >60 mL/min (ref >60)
Glucose, Bld: 100 mg/dL — ABNORMAL HIGH (ref 70–99)
Potassium: 3.5 mmol/L (ref 3.5–5.1)
Sodium: 141 mmol/L (ref 135–145)
Total Bilirubin: 0.8 mg/dL (ref 0.3–1.2)
Total Protein: 6.1 g/dL — ABNORMAL LOW (ref 6.5–8.1)

## 2020-04-27 LAB — LIPID PANEL
Cholesterol: 95 mg/dL (ref 0–200)
HDL: 37 mg/dL — ABNORMAL LOW (ref >40)
LDL Cholesterol: 44 mg/dL (ref 0–99)
Total CHOL/HDL Ratio: 2.6 RATIO
Triglycerides: 72 mg/dL (ref <150)
VLDL: 14 mg/dL (ref 0–40)

## 2020-04-27 LAB — ECHOCARDIOGRAM LIMITED
Area-P 1/2: 4.39 cm2
Height: 65 in
S' Lateral: 2.9 cm
Weight: 3693.15 oz

## 2020-04-27 LAB — SEDIMENTATION RATE: Sed Rate: 4 mm/hr (ref 0–16)

## 2020-04-27 LAB — HEMOGLOBIN A1C
Hgb A1c MFr Bld: 6 % — ABNORMAL HIGH (ref 4.8–5.6)
Mean Plasma Glucose: 125.5 mg/dL

## 2020-04-27 MED ORDER — CLOPIDOGREL BISULFATE 75 MG PO TABS
75.0000 mg | ORAL_TABLET | Freq: Every day | ORAL | Status: DC
  Administered 2020-04-27 – 2020-04-29 (×3): 75 mg via ORAL
  Filled 2020-04-27 (×3): qty 1

## 2020-04-27 MED ORDER — ENOXAPARIN SODIUM 40 MG/0.4ML ~~LOC~~ SOLN
40.0000 mg | Freq: Every day | SUBCUTANEOUS | Status: DC
  Administered 2020-04-27 – 2020-04-28 (×2): 40 mg via SUBCUTANEOUS
  Filled 2020-04-27 (×3): qty 0.4

## 2020-04-27 MED ORDER — ACETAMINOPHEN 160 MG/5ML PO SOLN: 650.0000 mg | ORAL | Status: DC | PRN

## 2020-04-27 MED ORDER — ASPIRIN EC 81 MG PO TBEC
81.0000 mg | DELAYED_RELEASE_TABLET | Freq: Every day | ORAL | Status: DC
  Administered 2020-04-27 – 2020-04-28 (×2): 81 mg via ORAL
  Filled 2020-04-27 (×2): qty 1

## 2020-04-27 MED ORDER — AMLODIPINE BESYLATE 5 MG PO TABS
5.0000 mg | ORAL_TABLET | Freq: Two times a day (BID) | ORAL | Status: DC
  Administered 2020-04-28 – 2020-04-29 (×3): 5 mg via ORAL
  Filled 2020-04-27 (×3): qty 1

## 2020-04-27 MED ORDER — ATORVASTATIN CALCIUM 80 MG PO TABS
80.0000 mg | ORAL_TABLET | Freq: Every day | ORAL | Status: DC
  Administered 2020-04-27 – 2020-04-29 (×3): 80 mg via ORAL
  Filled 2020-04-27 (×3): qty 1

## 2020-04-27 MED ORDER — ACETAMINOPHEN 325 MG PO TABS
650.0000 mg | ORAL_TABLET | ORAL | Status: DC | PRN
  Administered 2020-04-28: 650 mg via ORAL
  Filled 2020-04-27: qty 2

## 2020-04-27 MED ORDER — STROKE: EARLY STAGES OF RECOVERY BOOK
Freq: Once | Status: AC
  Filled 2020-04-27: qty 1

## 2020-04-27 MED ORDER — ACETAMINOPHEN 650 MG RE SUPP: 650.0000 mg | RECTAL | Status: DC | PRN

## 2020-04-27 NOTE — Progress Notes (Signed)
*  PRELIMINARY RESULTS* Echocardiogram 2D Echocardiogram limited has been performed.  Jeryl Columbia 04/27/2020, 1:58 PM

## 2020-04-27 NOTE — Progress Notes (Signed)
OT Cancellation Note  Patient Details Name: Travis Chavez MRN: [id number] DOB: September 07, 1960   Cancelled Treatment:    Reason Eval/Treat Not Completed: Patient at procedure or test/ unavailable Pt off the unit for an MRI. OT will return as time allows and pt is appropriate.   [facility] OTR/L Acute Rehabilitation Services Office: [phone]   Rebeca Alert 04/27/2020, 2:25 PM

## 2020-04-27 NOTE — Consult Note (Signed)
Neurology Consultation Reason for Consult: Acute stroke right PCA territory  Referring Physician: Dr. Katrinka Blazing  CC: Loss of vision  History is obtained from:patient, wife and chart   HPI: Travis Chavez is a 59 y.o. right handed black male with a PMHx significant for hyperlipidemia, hypertension, GERD, arrhythmia, CVA with residual right-sided weakness (2012) , TIA, obesity, CKD stage II, left basal ganglia hemorrhage and recent admission to [facility] for watershed TIA who presented initially to [facility] on 04/26/20 with complaints of loss of vision in his left eye.  He woke up yesterday with left arm numbness and tingling as well as transient loss of vision in his left eye, lasting approximately 3-4 hours. He was brought to the [facility] for evaluation. CT brain showed no acute intracranial abnormalities. CTA head and neck revealed moderate stenosis of the cavernous portion of the right ICA, punctate calcifications at the distal left vertebral artery, and a nonvisualizable right posterior cerebral artery. He was transferred to this hospital for further evaluation and treatment. MRI brain done here showed multiple acute infarcts in the right PCA territory, as well as subacute infarcts in the left corpus collosum, chronic infarcts in the left corona radiata and basal ganglia, as well as the right cerebellum. MRA head showed occlusion of the right P2 segment of the right PCA.   This is the second recent occurrence of left sided numbness. In August 2021 the patient was seen in the ED as a code stroke for left sided numbness. CT head done at the time showed a chronic left basal ganglia stroke. CTA head showed high grade stenosis of proximal Left M1 and proximal right P2 PCA; no stenosis in the neck. MRI did not show an infarction at the time. He was placed on DAPT with aspirin and plavix for 21 days, and patient was continued only on aspirin 81 mg  after 21 days.     Neurology was consulted regarding  these acute and chronic infarcts, and we thank the primary team for their kind referral.  LKW:  04/26/2020 tPA given?: No  IA performed?: No  Premorbid modified rankin scale: 2      ROS: A 14 point ROS was performed and is negative except as noted in the HPI.     Past Medical History:  Diagnosis Date  . Hypertension   . Stroke [facility])      No family history on file.   Social History:  reports that he has never smoked. He has never used smokeless tobacco. He reports that he does not drink alcohol and does not use drugs.    Exam: Current vital signs: BP (!) 149/103 (BP Location: Right Arm)   Pulse 80   Temp 98 F (36.7 C) (Oral)   Resp 20   Ht 5\' 5"  (1.651 m)   Wt 104.7 kg   SpO2 99%   BMI 38.41 kg/m  Vital signs in last 24 hours: Temp:  [98 F (36.7 C)-98.7 F (37.1 C)] 98 F (36.7 C) (11/21 1142) Pulse Rate:  [72-83] 80 (11/21 1604) Resp:  [17-20] 20 (11/21 1604) BP: (134-149)/(92-103) 149/103 (11/21 1604) SpO2:  [99 %] 99 % (11/21 0600) Weight:  [104.7 kg] 104.7 kg (11/21 1025)   Physical Exam  Constitutional: Appears well-developed and well-nourished.  Psych: Affect appropriate to situation Eyes: No scleral injection HENT: No OP obstrucion MSK: no joint deformities.  Cardiovascular: Normal rate and regular rhythm.  Respiratory: Effort normal, non-labored breathing GI: Soft.  No distension. There  is no tenderness.  Skin: WDI  Neuro: Mental Status: Pt seen in room, wife at bedside providing support. Patient is awake, alert, oriented to person, place, month, year, and situation.  Patient is able to give a clear and coherent history.  No signs of aphasia or neglect  Cranial Nerves: II:  Patient has evidence of left homonymous hemianopsia.  Pupils are equal, round, and reactive to light.    III,IV, VI: EOMI without ptosis or diplopia.  V: Facial sensation is symmetric to temperature VII: Facial movement is symmetric.  VIII: hearing is intact to voice X:  Palate elevates symmetrically XI: Shoulder shrug is symmetric. XII: tongue is midline without atrophy or fasciculations.  Motor: Bulk is normal.  5/5 strength of left arm and both legs.  Right arm strength is chronically 4/5 from prior left basal ganglia hemorrhage. RUE movements are also slow.  Sensory: Sensation is symmetric to light touch and temperature in the arms and legs.  Deep Tendon Reflexes: 2+ and symmetric in the biceps and patellae.   Plantars: Toes are downgoing bilaterally.   Cerebellar: FNF and HKS are without ataxia. RUE FNF is bradykinetic.    I have reviewed labs in epic and the results pertinent to this consultation are:  Results for DVID, PENDRY (MRN [id number]) as of 04/27/2020 16:44  Ref. Range 04/27/2020 07:46  Cholesterol Latest Ref Range: 0 - 200 mg/dL 95  HDL Cholesterol Latest Ref Range: >40 mg/dL 37 (L)  LDL (calc) Latest Ref Range: 0 - 99 mg/dL 44  Triglycerides Latest Ref Range: <150 mg/dL 72  VLDL Latest Ref Range: 0 - 40 mg/dL 14   Results for BENJIMEN, KELLEY (MRN [id number]) as of 04/27/2020 16:44  Ref. Range 04/27/2020 07:46  Hemoglobin A1C Latest Ref Range: 4.8 - 5.6 % 6.0 (H)   I have reviewed the images obtained:   MRI brain/MRA head and neck  04/27/2020 Multiple small acute infarcts of the right PCA territory. 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  Probable small subacute infarct involving the left splenium of the corpus callosum. Moderate to advanced chronic microvascular ischemic changes. A few chronic infarcts are also noted.  ECHOCARDIOGRAM: 04/27/2020 1. Left ventricular ejection fraction, by estimation, is 60 to 65%. The  left ventricle has normal function. There is mild left ventricular  hypertrophy. Left ventricular diastolic parameters are consistent with  Grade I diastolic dysfunction (impaired  relaxation).  2. The mitral valve is normal in structure. Trivial mitral valve  regurgitation.  3.  The aortic valve is normal in structure.  4. The inferior vena cava is normal in size with greater than 50%  respiratory variability, suggesting right atrial pressure of 3 mmHg.  Conclusion(s)/Recommendation(s): No intracardiac source of embolism  detected on this transthoracic study. A transesophageal echocardiogram is  recommended to exclude cardiac source of embolism if clinically indicated.   Assessment: 59 year old male with history of prior stroke and TIA, on dual antiplatelet therapy initially then switched to monotherapy per protocol, who presented to an OSH in [place] with left hand numbness and vision loss. He was transferred to [facility] for stroke management. MRI reveals acute right PCA territory ischemic infarction in association with a new right PCA occlusion.  Recommendations: 1) PT/OT/speech consult 2) Dual antiplatelet therapy with lifelong aspirin and Plavix given multiplicity of strokes and acute stroke while on monotherapy. 3) Risk factor modification: diet changes, exercise 4) Telemetry monitoring 5) Frequent neuro checks 6) Had CTA of head  and neck in August. Does not need to be repeated given short time interval since last imaging.  7) Lower blood pressure by 15% daily for goal blood pressure <130/80 8) High dose statin therapy for goal LDL <70. 9) TEE.   10) If negative TEE and no atrial fibrillation on cardiac telemetry, will need either Holter monitoring or a loop recorder 11) IVF 12) Stroke Team to follow in the AM.    I have seen and examined the patient. I have formulated the assessment and recommendations. 59 year old male with history of prior stroke and TIA, on dual antiplatelet therapy initially then switched to monotherapy per protocol, who presented to an OSH in [place] with left hand numbness and vision loss. He was transferred to [facility] for stroke management. MRI reveals acute right PCA territory ischemic infarction in association with a new right PCA occlusion.  Recommendations as above.  Electronically signed: Dr. Caryl Pina

## 2020-04-27 NOTE — Evaluation (Signed)
Physical Therapy Evaluation Patient Details Name: Travis Chavez MRN: [id number] DOB: 06/24/1960 Today's Date: 04/27/2020   History of Present Illness  Pt is a 59 y/o male admitted secondary to L hand numbness, and L visual deficits. Imaging pending. PMH includes CVA with R sided deficits and HTN.   Clinical Impression  Pt admitted secondary to problem above with deficits below. Pt with R sided deficits at baseline. Did not possible L peripheral vision deficits, however, will need more formal assessment. Pt also had difficulty reading large lettering on PT's badge. Reports he felt like part of it was cut off. Pt requiring min guard A for mobility. RLE deficits noted throughout gait. Feel he would benefit from outpatient PT at d/c. Will continue to follow acutely.     Follow Up Recommendations Outpatient PT (neuro outpatient )    Equipment Recommendations  None recommended by PT    Recommendations for Other Services       Precautions / Restrictions Precautions Precautions: Fall Restrictions Weight Bearing Restrictions: No      Mobility  Bed Mobility Overal bed mobility: Needs Assistance Bed Mobility: Supine to Sit;Sit to Supine     Supine to sit: Supervision Sit to supine: Supervision   General bed mobility comments: Supervision for safety. Pt using bed rails to come to sitting.     Transfers Overall transfer level: Needs assistance Equipment used: None Transfers: Sit to/from Stand Sit to Stand: Min guard         General transfer comment: Min guard for safety. No LOB noted.   Ambulation/Gait Ambulation/Gait assistance: Min guard Gait Distance (Feet): 15 Feet Assistive device: None Gait Pattern/deviations: Step-through pattern;Decreased stride length;Wide base of support Gait velocity: Decreased   General Gait Details: Pt with mild unsteadiness. Pt tended to drag RLE during gait, however, reports this as baseline. Wide BOS noted. Pt requesting to stay in the  room this session.  Stairs            Wheelchair Mobility    Modified Rankin (Stroke Patients Only)       Balance Overall balance assessment: Needs assistance Sitting-balance support: No upper extremity supported Sitting balance-Leahy Scale: Good     Standing balance support: No upper extremity supported Standing balance-Leahy Scale: Fair                               Pertinent Vitals/Pain Pain Assessment: No/denies pain    Home Living Family/patient expects to be discharged to:: Private residence Living Arrangements: Spouse/significant other Available Help at Discharge: Family;Available 24 hours/day Type of Home: House Home Access: Stairs to enter Entrance Stairs-Rails: Right;Left;Can reach both Entrance Stairs-Number of Steps: 7 Home Layout: Two level;Able to live on main level with bedroom/bathroom Home Equipment: Dan Humphreys - 2 wheels;Wheelchair - manual;Bedside commode;Cane - single point      Prior Function Level of Independence: Independent               Hand Dominance        Extremity/Trunk Assessment   Upper Extremity Assessment Upper Extremity Assessment: Defer to OT evaluation    Lower Extremity Assessment Lower Extremity Assessment: RLE deficits/detail RLE Deficits / Details: RLE weakness at baseline. Pt tends to drag RLE during gait.     Cervical / Trunk Assessment Cervical / Trunk Assessment: Normal  Communication   Communication: No difficulties  Cognition Arousal/Alertness: Awake/alert Behavior During Therapy: WFL for tasks assessed/performed Overall Cognitive Status: No family/caregiver present  to determine baseline cognitive functioning                                        General Comments General comments (skin integrity, edema, etc.): Did note possible peripheral visual deficits on the L during session. Will need more formal visual assessment.     Exercises     Assessment/Plan    PT  Assessment Patient needs continued PT services  PT Problem List Decreased strength;Decreased balance;Decreased mobility;Decreased cognition;Decreased safety awareness       PT Treatment Interventions DME instruction;Gait training;Functional mobility training;Stair training;Therapeutic activities;Therapeutic exercise;Balance training;Patient/family education    PT Goals (Current goals can be found in the Care Plan section)  Acute Rehab PT Goals Patient Stated Goal: to get some rest PT Goal Formulation: With patient Time For Goal Achievement: 05/11/20 Potential to Achieve Goals: Good    Frequency Min 3X/week   Barriers to discharge        Co-evaluation               AM-PAC PT "6 Clicks" Mobility  Outcome Measure Help needed turning from your back to your side while in a flat bed without using bedrails?: None Help needed moving from lying on your back to sitting on the side of a flat bed without using bedrails?: None Help needed moving to and from a bed to a chair (including a wheelchair)?: A Little Help needed standing up from a chair using your arms (e.g., wheelchair or bedside chair)?: A Little Help needed to walk in hospital room?: A Little Help needed climbing 3-5 steps with a railing? : A Lot 6 Click Score: 19    End of Session   Activity Tolerance: Patient tolerated treatment well Patient left: in bed;with call bell/phone within reach Nurse Communication: Mobility status PT Visit Diagnosis: Unsteadiness on feet (R26.81);Muscle weakness (generalized) (M62.81)    Time: 2706-2376 PT Time Calculation (min) (ACUTE ONLY): 13 min   Charges:   PT Evaluation $PT Eval Low Complexity: 1 Low          Cindee Salt, DPT  Acute Rehabilitation Services  Pager: [phone] Office: [phone]   Lehman Prom 04/27/2020, 9:27 AM

## 2020-04-27 NOTE — H&P (Addendum)
History and Physical    Travis Chavez MGN:[id number] DOB: 31-Dec-1960 DOA: 04/27/2020  Referring MD/NP/PA: Raphael Gibney, DO PCP: Fanny Dance, FNP  Patient coming from: Transfer from [facility]  Chief Complaint: Ulceration in the left eye  I have personally briefly reviewed patient's old medical records in [place]   HPI: Travis Chavez is a 59 y.o. male with medical history significant of  hyperlipidemia, hypertension, arrhythmia, CVA with residual right-sided weakness, TIA, obesity, CKD stage II, and GERD who presented initially to [facility] yesterday with complaints of loss of vision in his left eye.  Patient notes that he woke up around 6 AM and noticed that his left hand was tingly and went numb.  Thereafter, he noticed that he was unable to see out of his left eye and was nauseous.  Denies having any headache, palpitations, chest pain, vomiting, diarrhea, cough, shortness of breath, recent sick contacts to his knowledge.  After about 3 hours or so he slowly regained some vision out of the left eye and the numbness and tingling symptoms in his left hand went away.  Prior to yesterday patient was able to see relatively clearly.  Patient was hospitalized back in August of this year with what he reports similar symptoms including some left-sided numbness.  CTA at that time had revealed no large vessel occlusion but segmental high-grade stenosis of the proximal left M1 MCA and proximal right P2 PCA.  MRI was negative for any signs of acute infarct and showed a chronic hemorrhage of the left putamen at that time.  The symptoms were thought to be secondary to a TIA.  He was placed on dual antiplatelet therapy with aspirin and Plavix for 3 weeks and then to continue just aspirin alone.  He was scheduled to follow-up with Dr. Pearlean Brownie and Dr. Earnestine Leys in the outpatient setting for close management with vascular risk factors.  He is also being followed by Dr. 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  Patient was not thought to need a pacemaker at this time.  At [facility] patient was seen to be afebrile with blood pressures elevated up to 162/96, and all other vital signs relatively within normal limits. CT of the brain showed no acute abnormalities. Labs were significant for WBC 16.3, hemoglobin 14.5, hematocrit 44, platelet count 330, sodium 139, potassium 3.5, chloride 107, carbon dioxide 27, BUN 19, creatinine 1.2,  glucose 139, PT/INR 11.6/1.1, and APTT 31.    CTA of the head and neck revealed area of mild to moderate stenosis within the intracavernous portion of the right internal carotid, punctate calcifications within the distal left vertebral artery with mild stenosis, and absent right posterior artery.  Chest x-ray showed no acute abnormalities.  Case had been discussed with Dr. Otelia Limes of neurology who recommended transfer to [facility]  ED Course: As seen above  Review of Systems  Constitutional: Negative for chills and fever.  HENT: Negative for ear discharge and nosebleeds.   Eyes: Positive for blurred vision. Negative for discharge.       Positive for loss of vision  Respiratory: Negative for cough and shortness of breath.   Cardiovascular: Negative for chest pain and leg swelling.  Gastrointestinal: Positive for nausea. Negative for abdominal pain, diarrhea and vomiting.  Genitourinary: Negative for dysuria and frequency.  Musculoskeletal: Negative for falls.  Skin: Negative for  rash.  Neurological: Positive for sensory change and focal weakness (Right-sided weakness from previous stroke). Negative for loss of consciousness and headaches.  Psychiatric/Behavioral: Negative for memory loss and substance abuse.    Past Medical History:  Diagnosis Date  . Hypertension   . Stroke [facility])     No past surgical history on file.    reports that he has never smoked. He has never used smokeless tobacco. He reports that he does not drink alcohol and does not use drugs.  Allergies  Allergen Reactions  . Beta Adrenergic Blockers Nausea And Vomiting    SOB    No family history on file.  Prior to Admission medications   Medication Sig Start Date End Date Taking? Authorizing Provider  amLODipine (NORVASC) 5 MG tablet Take 5 mg by mouth 2 (two) times daily. 02/29/20   [provider]  aspirin EC 81 MG EC tablet Take 1 tablet (81 mg total) by mouth daily. Swallow whole. 02/04/20   Marguerita Merles Latif, DO  atorvastatin (LIPITOR) 80 MG tablet Take 1 tablet (80 mg total) by mouth daily. 02/04/20   Marguerita Merles Latif, DO  furosemide (LASIX) 20 MG tablet Take 1 tablet (20 mg total) by mouth daily. 03/25/20   Jake Bathe, MD  hydrALAZINE (APRESOLINE) 50 MG tablet Take 1 tablet (50 mg total) by mouth every 8 (eight) hours. 02/04/20   Marguerita Merles Latif, DO  hydrochlorothiazide (HYDRODIURIL) 25 MG tablet Take 1 tablet (25 mg total) by mouth daily. 02/05/20   Marguerita Merles Latif, DO  potassium chloride SA (KLOR-CON) 20 MEQ tablet Take 1 tablet (20 mEq total) by mouth daily. 04/10/20   Jake Bathe, MD  terazosin (HYTRIN) 2 MG capsule Take 2 mg by mouth 2 (two) times daily. 02/22/20   [provider]  vitamin B-12 (CYANOCOBALAMIN) 1000 MCG tablet Take 1,000 mcg by mouth daily.    [provider]    Physical Exam:  Constitutional: Middle-aged male currently NAD, calm, comfortable Vitals:   04/27/20 0555 04/27/20 0600  BP:  (!) 134/92  Pulse:  72  Resp:  18  Temp:  98.7 F (37.1 C)  TempSrc:  Oral  SpO2:  99%  Weight: 104.7 kg    Eyes: Decreased lateral visual field in the left eye.  Otherwise pupils round and reactive to light ENMT: Mucous membranes are moist. Posterior pharynx clear of any exudate or lesions.  Neck: normal, supple, no masses, no thyromegaly Respiratory: clear to auscultation  bilaterally, no wheezing, no crackles. Normal respiratory effort. No accessory muscle use.  Cardiovascular: Regular rate and rhythm, no murmurs / rubs / gallops. No extremity edema. 2+ pedal pulses. No carotid bruits.  Abdomen: no tenderness, no masses palpated. No hepatosplenomegaly. Bowel sounds positive.  Musculoskeletal: no clubbing / cyanosis. No joint deformity upper and lower extremities. Good ROM, no contractures. Normal muscle tone.  Skin: no rashes, lesions, ulcers. No induration Neurologic: CN 2-12 grossly intact. Sensation intact, DTR normal. Strength 4/5 in the right upper extremity.  Strength 5 out of 5 on the left upper and lower extremity. Psychiatric: Normal judgment and insight. Alert and oriented x 3. Normal mood.     Labs on Admission: I have personally reviewed following labs and imaging studies  CBC: No results for input(s): WBC, NEUTROABS, HGB, HCT, MCV, PLT in the last 168 hours. Basic Metabolic Panel: No results for input(s): NA, K, CL, CO2, GLUCOSE, BUN, CREATININE, CALCIUM, MG, PHOS in the last 168 hours. GFR: Estimated Creatinine Clearance: 80.4  mL/min (by C-G formula based on SCr of 1.13 mg/dL). Liver Function Tests: No results for input(s): AST, ALT, ALKPHOS, BILITOT, PROT, ALBUMIN in the last 168 hours. No results for input(s): LIPASE, AMYLASE in the last 168 hours. No results for input(s): AMMONIA in the last 168 hours. Coagulation Profile: No results for input(s): INR, PROTIME in the last 168 hours. Cardiac Enzymes: No results for input(s): CKTOTAL, CKMB, CKMBINDEX, TROPONINI in the last 168 hours. BNP (last 3 results) No results for input(s): PROBNP in the last 8760 hours. HbA1C: No results for input(s): HGBA1C in the last 72 hours. CBG: No results for input(s): GLUCAP in the last 168 hours. Lipid Profile: No results for input(s): CHOL, HDL, LDLCALC, TRIG, CHOLHDL, LDLDIRECT in the last 72 hours. Thyroid Function Tests: No results for input(s):  TSH, T4TOTAL, FREET4, T3FREE, THYROIDAB in the last 72 hours. Anemia Panel: No results for input(s): VITAMINB12, FOLATE, FERRITIN, TIBC, IRON, RETICCTPCT in the last 72 hours. Urine analysis: No results found for: COLORURINE, APPEARANCEUR, LABSPEC, PHURINE, GLUCOSEU, HGBUR, BILIRUBINUR, KETONESUR, PROTEINUR, UROBILINOGEN, NITRITE, LEUKOCYTESUR Sepsis Labs: No results found for this or any previous visit (from the past 240 hour(s)).   Radiological Exams on Admission: No results found.  EKG: Independently reviewed.  Sinus rhythm at 85 bpm with first-degree AV heart block and marked left axis deviation.  Assessment/Plan Left vision change and left arm numbness/tingling: Acute.  Patient woke up yesterday morning around approximately 6 AM and thereafter noted left arm numbness with loss of vision in the left eye.  Patient now reports that some of his vision has come back in the left eye, but still has a difficult time seeing out of it.  Numbness has resolved.  At the outside facility CTA revealed signs of mild to moderate stenosis of the right internal carotid artery.  Currently LDL at goal less than 70 and hemoglobin A1c stable at 6 as patient still having some blurred vision the left eye suspect the possibility of him having an acute stroke.  Similar episode occurred in August, but was thought likely secondary to a TIA. -Admit to a medical telemetry bed  -Neurochecks -Echocardiogram -Check MRI of the brain -PT/OT/speech to eval and treat -Follow-up telemetry, consider formally consulting cardiology if needed\ -Continue aspirin and statin -Appreciate neurology consultative services and will follow-up for any further recommendations  Essential hypertension: Blood pressures currently stable.  At home patient reports that he has been taking his blood pressure medications. -Allowing for permissive hypertension -Consider restarting home medications when medically appropriate  Leukocytosis:  Resolved.  At the outside facility white blood cell count was elevated at 16.38, but recheck today is within normal limits.  Chest x-ray was otherwise noted to be clear and he denies any urinary symptoms.  Suspect this possibly was just reactive in nature.  Prediabetes: Hemoglobin A1c was noted to be 6 and unchanged from previous.   -Continue to monitor in the outpatient setting  Hyperlipidemia: Improved.  Lipid panel revealed total cholesterol 95, HDL 37, LDL 44, and triglycerides 72.  Previously, patient's LDL was elevated at 120 back in August of this year.  Patient appears to be at goal of LDL less than 70 on atorvastatin 80 mg daily. -Continue atorvastatin 80 mg daily  Obesity: BMI of 38.41 kg/m -Will benefit from further counseling in the outpatient setting on weight loss management.  DVT prophylaxis: Lovenox Code Status: Full  Family Communication: Attempted to update wife over the phone, voicemail left Disposition Plan: Likely discharge home in 2  to 3 days Consults called: Neurology Admission status: Inpatient, patient likely need 2 midnight stay and work-up of suspected stroke as patient patient has not returned to baseline  Clydie Braunondell A Madissen Wyse MD [facility] Pager [phone]   If 7PM-7AM, please contact night-coverage [email or web address] Password [email or web address]  04/27/2020, 6:59 AM

## 2020-04-28 LAB — COMPREHENSIVE METABOLIC PANEL
ALT: 16 U/L (ref 0–44)
AST: 12 U/L — ABNORMAL LOW (ref 15–41)
Albumin: 3.5 g/dL (ref 3.5–5.0)
Alkaline Phosphatase: 58 U/L (ref 38–126)
Anion gap: 9 (ref 5–15)
BUN: 17 mg/dL (ref 6–20)
CO2: 27 mmol/L (ref 22–32)
Calcium: 8.7 mg/dL — ABNORMAL LOW (ref 8.9–10.3)
Chloride: 107 mmol/L (ref 98–111)
Creatinine, Ser: 1.34 mg/dL — ABNORMAL HIGH (ref 0.61–1.24)
GFR, Estimated: >60 mL/min (ref >60)
Glucose, Bld: 97 mg/dL (ref 70–99)
Potassium: 3.2 mmol/L — ABNORMAL LOW (ref 3.5–5.1)
Sodium: 143 mmol/L (ref 135–145)
Total Bilirubin: 1.1 mg/dL (ref 0.3–1.2)
Total Protein: 5.8 g/dL — ABNORMAL LOW (ref 6.5–8.1)

## 2020-04-28 LAB — CBC
HCT: 41.7 % (ref 39.0–52.0)
Hemoglobin: 13.7 g/dL (ref 13.0–17.0)
MCH: 28.9 pg (ref 26.0–34.0)
MCHC: 32.9 g/dL (ref 30.0–36.0)
MCV: 88 fL (ref 80.0–100.0)
Platelets: 276 10*3/uL (ref 150–400)
RBC: 4.74 MIL/uL (ref 4.22–5.81)
RDW: 12.8 % (ref 11.5–15.5)
WBC: 9.4 10*3/uL (ref 4.0–10.5)
nRBC: 0 % (ref 0.0–0.2)

## 2020-04-28 LAB — PROTIME-INR
INR: 1.1 (ref 0.8–1.2)
Prothrombin Time: 13.4 seconds (ref 11.4–15.2)

## 2020-04-28 MED ORDER — SODIUM CHLORIDE 0.9 % IV SOLN: INTRAVENOUS | Status: DC

## 2020-04-28 MED ORDER — VITAMIN B-12 1000 MCG PO TABS
1000.0000 ug | ORAL_TABLET | Freq: Every day | ORAL | Status: DC
  Administered 2020-04-28 – 2020-04-29 (×2): 1000 ug via ORAL
  Filled 2020-04-28 (×2): qty 1

## 2020-04-28 MED ORDER — POTASSIUM CHLORIDE CRYS ER 20 MEQ PO TBCR
20.0000 meq | EXTENDED_RELEASE_TABLET | Freq: Every day | ORAL | Status: DC
  Administered 2020-04-28 – 2020-04-29 (×2): 20 meq via ORAL
  Filled 2020-04-28 (×2): qty 1

## 2020-04-28 MED ORDER — POTASSIUM CHLORIDE CRYS ER 20 MEQ PO TBCR
40.0000 meq | EXTENDED_RELEASE_TABLET | Freq: Once | ORAL | Status: AC
  Administered 2020-04-28: 40 meq via ORAL
  Filled 2020-04-28: qty 2

## 2020-04-28 MED ORDER — ASPIRIN EC 81 MG PO TBEC
81.0000 mg | DELAYED_RELEASE_TABLET | Freq: Every day | ORAL | Status: DC
  Administered 2020-04-29: 81 mg via ORAL
  Filled 2020-04-28: qty 1

## 2020-04-28 NOTE — Evaluation (Signed)
Speech Language Pathology Evaluation Patient Details Name: Travis Chavez MRN: [id number] DOB: 10-19-1960 Today's Date: 04/28/2020 Time: 0923-3007 SLP Time Calculation (min) (ACUTE ONLY): 18 min  Problem List:  Patient Active Problem List   Diagnosis Date Noted  . Change in vision 04/27/2020  . Essential hypertension 04/27/2020  . Hyperlipidemia 04/27/2020  . Obesity (BMI 30-39.9) 04/27/2020  . Pre-diabetes 02/03/2020  . Cerebral hemorrhage (HCC) 02/02/2020  . Anemia 02/02/2020  . Disorder of bursae of shoulder region 02/02/2020  . Supraventricular tachycardia (HCC) 02/02/2020  . TIA (transient ischemic attack) 02/02/2020  . 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 Osteoarthritis of left hip 11/21/2018   Past Medical History:  Past Medical History:  Diagnosis Date  . Hypertension   . Stroke [facility])    Past Surgical History: No past surgical history on file. HPI:  Pt is a 59 y.o. male with medical history significant of  hyperlipidemia, hypertension, arrhythmia, CVA with residual right-sided weakness, TIA, obesity, CKD stage II, and GERD who presented initially to [facility] pm 11/20 with complaints of loss of vision in his left eye. MRI brain: Multiple small acute infarcts of the right PCA territory.   Assessment / Plan / Recommendation Clinical Impression  Pt participated in speech/language/cognition evaluation. He denied any baseline deficits in speech, language, or cognition. Per the pt he is currently on disability, but has some college education and was independent with medication and financial management prior to admission. The [facility] Mental Status Examination was completed to evaluate the pt's cognitive-linguistic skills. He achieved a score of 15/30 which is below the normal limits of 27 or more out of 30. He exhibited difficulty in the areas of awareness, memory, complex problem solving, and executive function. Pt's speech  and language skills were WNL. Skilled SLP services are clinically indicated at this time to improve cognitive-linguistic function.    SLP Assessment  SLP Recommendation/Assessment: Patient needs continued Speech Lanaguage Pathology Services SLP Visit Diagnosis: Cognitive communication deficit (R41.841)    Follow Up Recommendations  Outpatient SLP    Frequency and Duration min 2x/week  2 weeks      SLP Evaluation Cognition  Overall Cognitive Status: Impaired/Different from baseline Arousal/Alertness: Awake/alert Orientation Level: Oriented X4 Attention: Focused;Sustained Focused Attention: Appears intact Sustained Attention: Impaired Sustained Attention Impairment: Verbal complex Memory: Impaired Memory Impairment: Retrieval deficit;Decreased recall of new information (Immediate: 5/5; delayed: 2/5; with cues: 3/3) Awareness: Impaired Awareness Impairment: Intellectual impairment Executive Function: Reasoning;Sequencing;Organizing Reasoning: Impaired Reasoning Impairment: Verbal complex Sequencing: Impaired Sequencing Impairment: Verbal complex (Chavez drawing:  2/4) Organizing: Appears intact (backward digit span: 3/3)       Comprehension  Auditory Comprehension Overall Auditory Comprehension: Appears within functional limits for tasks assessed Yes/No Questions: Within Functional Limits Commands: Within Functional Limits Conversation: Complex    Expression Expression Primary Mode of Expression: Verbal Verbal Expression Overall Verbal Expression: Appears within functional limits for tasks assessed Initiation: No impairment Level of Generative/Spontaneous Verbalization: Conversation Repetition: No impairment Naming: No impairment Pragmatics: No impairment   Oral / Motor  Oral Motor/Sensory Function Overall Oral Motor/Sensory Function: Within functional limits Motor Speech Overall Motor Speech: Appears within functional limits for tasks assessed Respiration: Within  functional limits Phonation: Normal Resonance: Within functional limits Articulation: Within functional limitis Intelligibility: Intelligible Motor Planning: Witnin functional limits Motor Speech Errors: Not applicable   Travis Chavez I. Travis Clock, MS, CCC-SLP Acute Rehabilitation Services Office number [phone] Pager [phone]  Travis Chavez 04/28/2020, 10:17 AM

## 2020-04-28 NOTE — Progress Notes (Addendum)
Physical Therapy Treatment Patient Details Name: Travis Chavez MRN: [id number] DOB: [birth year] Today's Date: 04/28/2020    History of Present Illness Pt is a 59 yo male s/p MRI brain: multiple acute infarcts in the right PCA territory, as well as subacute infarcts in the left corpus collosum, chronic infarcts in the left corona radiata and basal ganglia, as well as the right cerebellum. PMHx:  HLD, HTN, GERD, arrhythmia, CVA with residual right-sided weakness (2012) , TIA, obesity, CKD stage II, left basal ganglia hemorrhage    PT Comments    Pt seated in recliner and eager to participate in PT session.  He continues to reports numbness in his R hand .  Pt with weakness noted in RUE/RLE.  Pt tolerated session well but noted with fatigue this session.  Pt continues to benefit from aggressive rehab during acute stay to be followed up with OPPT- neuro at d/c.     Based on new dx of CVA from previous eval will update frequnecy to 4x week and inform PT.    Follow Up Recommendations  Outpatient PT     Equipment Recommendations  None recommended by PT    Recommendations for Other Services       Precautions / Restrictions Precautions Precautions: Fall Precaution Comments: L field cut vs inattention, poor safety Restrictions Weight Bearing Restrictions: No    Mobility  Bed Mobility Overal bed mobility: Needs Assistance Bed Mobility: Sit to Supine       Sit to supine: Supervision   General bed mobility comments: Increased time, reliance on bed rails for back to bed.  Transfers Overall transfer level: Needs assistance Equipment used: None Transfers: Sit to/from Stand Sit to Stand: Min guard         General transfer comment: Min guard for safety and d/t decreased visual attention to L side, no LOB with fair correction.   Ambulation/Gait Ambulation/Gait assistance: Min guard Gait Distance (Feet): 120 Feet Assistive device: None Gait Pattern/deviations: Step-through  pattern;Decreased stride length;Wide base of support Gait velocity: Decreased   General Gait Details: Pt continues with mild unsteadiness this session.  Pt required cues for weight shifting, R LE swing through, upper trunk control and increasing activity.  He fatigues fairly quick.   Stairs             Wheelchair Mobility    Modified Rankin (Stroke Patients Only)       Balance Overall balance assessment: Needs assistance Sitting-balance support: No upper extremity supported Sitting balance-Leahy Scale: Good       Standing balance-Leahy Scale: Fair                              Cognition Arousal/Alertness: Awake/alert Behavior During Therapy: WFL for tasks assessed/performed Overall Cognitive Status: Impaired/Different from baseline Area of Impairment: Safety/judgement;Awareness;Problem solving;Attention                   Current Attention Level: Focused     Safety/Judgement: Decreased awareness of safety;Decreased awareness of deficits Awareness: Emergent Problem Solving: Slow processing;Requires verbal cues        Exercises General Exercises - Lower Extremity Hip Flexion/Marching: AROM;Both;10 reps;Supine;Limitations Hip Flexion/Marching Limitations: Difficulty lifting LLE into march due to poor balance in R sigle limb stance. Heel Raises: AROM;Both;10 reps;Supine Other Exercises Other Exercises: standing R UE "wall washing" hand over hand.  to improve R UE ROM and weight bearing.    General Comments  Pertinent Vitals/Pain Pain Assessment: No/denies pain    Home Living                      Prior Function            PT Goals (current goals can now be found in the care plan section) Acute Rehab PT Goals Patient Stated Goal: to go home Potential to Achieve Goals: Good Progress towards PT goals: Progressing toward goals    Frequency    Min 4X/week      PT Plan Current plan remains appropriate     Co-evaluation              AM-PAC PT "6 Clicks" Mobility   Outcome Measure  Help needed turning from your back to your side while in a flat bed without using bedrails?: None Help needed moving from lying on your back to sitting on the side of a flat bed without using bedrails?: None Help needed moving to and from a bed to a chair (including a wheelchair)?: A Little Help needed standing up from a chair using your arms (e.g., wheelchair or bedside chair)?: A Little Help needed to walk in hospital room?: A Little Help needed climbing 3-5 steps with a railing? : A Lot 6 Click Score: 19    End of Session Equipment Utilized During Treatment: Gait belt Activity Tolerance: Patient tolerated treatment well Patient left: in bed;with call bell/phone within reach Nurse Communication: Mobility status PT Visit Diagnosis: Unsteadiness on feet (R26.81);Muscle weakness (generalized) (M62.81)     Time: 8003-4917 PT Time Calculation (min) (ACUTE ONLY): 23 min  Charges:  $Gait Training: 8-22 mins $Therapeutic Exercise: 8-22 mins                     Bonney Leitz , PTA Acute Rehabilitation Services Pager [phone] Office [phone]     [name] 04/28/2020, 3:29 PM

## 2020-04-28 NOTE — Progress Notes (Signed)
Occupational Therapy Evaluation Patient Details Name: Travis Chavez MRN: [id number] DOB: [birth year] Today's Date: 04/28/2020    History of Present Illness Pt is a 59 yo male s/p MRI brain: multiple acute infarcts in the right PCA territory, as well as subacute infarcts in the left corpus collosum, chronic infarcts in the left corona radiata and basal ganglia, as well as the right cerebellum. PMHx:  HLD, HTN, GERD, arrhythmia, CVA with residual right-sided weakness (2012) , TIA, obesity, CKD stage II, left basal ganglia hemorrhage   Clinical Impression   Pt with excellent participation during OT evaluation this date. Wife present to confirm that pt requiring min A for LB ADL's and bathing at baseline, no DME for mobility, no recent falls, was driving and completing medication management prior to this admission. Currently pt requiring min guard for transfers and in room mobility, min A for ADL's, presenting with what appears to be L peripheral field cut and decreased insight leading to need for intermittent cues and at times directions for item identification and retrieval. Pt appropriate for continued skilled OT services with recommendations listed below.     Follow Up Recommendations  Outpatient OT;Home health OT;Supervision/Assistance - 24 hour    Equipment Recommendations  None recommended by OT    Recommendations for Other Services       Precautions / Restrictions Precautions Precautions: Fall Precaution Comments: L field cut vs inattention, poor safety      Mobility Bed Mobility Overal bed mobility: Needs Assistance Bed Mobility: Supine to Sit     Supine to sit: Supervision     General bed mobility comments: increased time, reliance on bed    Transfers Overall transfer level: Needs assistance Equipment used: None Transfers: Sit to/from Stand Sit to Stand: Min guard         General transfer comment: Min guard for safety and d/t decreased visual attention to L  side, no LOB with fair correction.     Balance Overall balance assessment: Needs assistance Sitting-balance support: No upper extremity supported Sitting balance-Leahy Scale: Good                                     ADL either performed or assessed with clinical judgement   ADL Overall ADL's : Needs assistance/impaired Eating/Feeding: Independent   Grooming: Min guard;Standing;Cueing for compensatory techniques Grooming Details (indicate cue type and reason): cues provided for scanning sink for item retrieval Upper Body Bathing: Minimal assistance   Lower Body Bathing: Minimal assistance   Upper Body Dressing : Minimal assistance   Lower Body Dressing: Minimal assistance   Toilet Transfer: Min guard   Toileting- Clothing Manipulation and Hygiene: Min guard       Functional mobility during ADLs: Min guard       Vision Patient Visual Report: Blurring of vision Vision Assessment?: Yes Eye Alignment: Within Functional Limits Ocular Range of Motion: Within Functional Limits Alignment/Gaze Preference: Head turned Tracking/Visual Pursuits: Able to track stimulus in all quads without difficulty Visual Fields: Left visual field deficit Additional Comments: apepars with ?field cut to L, requiring cues for scanning to increased safety and indep with mobility/transfers and ADLs. decreased awareness and carryover of scanning techniques during functional tasks.      Perception     Praxis      Pertinent Vitals/Pain Pain Assessment: No/denies pain     Hand Dominance Right   Extremity/Trunk Assessment Upper Extremity Assessment Upper  Extremity Assessment: RUE deficits/detail;LUE deficits/detail RUE Deficits / Details: near functional ROM available to RUE but pt with preference for use of LUE.- deficits present to R UE are baseline per pt and wife confirmation RUE Sensation: WNL RUE Coordination: decreased gross motor;decreased fine motor LUE Deficits /  Details: mild coordiantion deficits present on assessment, ROM and strength WFL.  LUE Sensation: decreased light touch (numbness/tingling )   Lower Extremity Assessment Lower Extremity Assessment: Defer to PT evaluation   Cervical / Trunk Assessment Cervical / Trunk Assessment: Normal   Communication Communication Communication: No difficulties   Cognition Arousal/Alertness: Awake/alert Behavior During Therapy: WFL for tasks assessed/performed Overall Cognitive Status: Impaired/Different from baseline Area of Impairment: Safety/judgement;Awareness;Problem solving;Attention                   Current Attention Level: Focused     Safety/Judgement: Decreased awareness of safety;Decreased awareness of deficits Awareness: Emergent Problem Solving: Slow processing;Requires verbal cues     General Comments  116/114 HR 83, with transition to chair 118/90, on RA    Exercises     Shoulder Instructions      Home Living Family/patient expects to be discharged to:: Private residence Living Arrangements: Spouse/significant other Available Help at Discharge: Family;Available 24 hours/day Type of Home: House Home Access: Stairs to enter [employer] of Steps: 7 Entrance Stairs-Rails: Right;Left;Can reach both Home Layout: Two level;Able to live on main level with bedroom/bathroom               Home Equipment: Dan Humphreys - 2 wheels;Wheelchair - manual;Bedside commode;Cane - single point      Lives With: Spouse;Family    Prior Functioning/Environment Level of Independence: Needs assistance  Gait / Transfers Assistance Needed: no use of device at baseline ADL's / Homemaking Assistance Needed: wife endorses pt was driving, completing own medication management, and light meal prep prior to this admission. A for LB ADL's and bathing tasks at baseline            OT Problem List: Decreased cognition;Decreased coordination;Decreased safety awareness;Decreased  knowledge of use of DME or AE;Decreased knowledge of precautions;Decreased activity tolerance;Impaired vision/perception;Impaired balance (sitting and/or standing)      OT Treatment/Interventions: Self-care/ADL training;Neuromuscular education;Cognitive remediation/compensation;Visual/perceptual remediation/compensation;Patient/family education;Balance training;DME and/or AE instruction    OT Goals(Current goals can be found in the care plan section) Acute Rehab OT Goals Patient Stated Goal: to go home OT Goal Formulation: With patient Time For Goal Achievement: 05/12/20 Potential to Achieve Goals: Good ADL Goals Pt Will Perform Grooming: with modified independence;standing Pt Will Perform Upper Body Dressing: with modified independence Pt Will Perform Lower Body Dressing: with supervision;with adaptive equipment;sit to/from stand Additional ADL Goal #1: Pt will require 0 cues for use of lighthouse scanning technique during item retreval tasks with Mod indep  OT Frequency: Min 2X/week   Barriers to D/C:            Co-evaluation              AM-PAC OT "6 Clicks" Daily Activity     Outcome Measure Help from another person eating meals?: None Help from another person taking care of personal grooming?: None Help from another person toileting, which includes using toliet, bedpan, or urinal?: None Help from another person bathing (including washing, rinsing, drying)?: A Little Help from another person to put on and taking off regular upper body clothing?: None Help from another person to put on and taking off regular lower body clothing?: A Little 6 Click Score:  22   End of Session Equipment Utilized During Treatment: Gait belt Nurse Communication: Mobility status  Activity Tolerance: Patient tolerated treatment well Patient left: in chair;with family/visitor present;with chair alarm set  OT Visit Diagnosis: Unsteadiness on feet (R26.81);Other symptoms and signs involving the  nervous system (R29.898);Hemiplegia and hemiparesis Hemiplegia - Right/Left: Right Hemiplegia - dominant/non-dominant: Dominant Hemiplegia - caused by: Cerebral infarction                Time: 1034-1110 OT Time Calculation (min): 36 min Charges:  OT General Charges $OT Visit: 1 Visit OT Evaluation $OT Eval Low Complexity: 1 Low OT Treatments $Self Care/Home Management : 8-22 mins  Meldon Hanzlik OTR/L acute rehab services Office: [phone]   Wilhemena Durie 04/28/2020, 12:40 PM

## 2020-04-28 NOTE — TOC Benefit Eligibility Note (Signed)
Transition of Care [facility]) Benefit Eligibility Note    Patient Details  Name: Travis Chavez MRN: [id number] Date of Birth: 1961/01/16   Medication/Dose: Marden Noble 90mg . BID. 30 day supply  Covered?: Yes  Tier:  (No Tier)  Prescription Coverage Preferred Pharmacy: [facility]  Spoke with Person/Company/Phone Number:: Waynette A. W/Pharmacy Hell[facility] Desk PH# [phone]  Co-Pay: $3.00  Prior Approval: No  Deductible:  (no deductible)       [phone] Phone Number: 04/28/2020, 4:44 PM

## 2020-04-28 NOTE — Progress Notes (Signed)
  Speech Language Pathology Treatment: Cognitive-Linquistic  Patient Details Name: Travis Chavez MRN: [id number] DOB: Jan 13, 1961 Today's Date: 04/28/2020 Time: 4944-9675 SLP Time Calculation (min) (ACUTE ONLY): 19 min  Assessment / Plan / Recommendation Clinical Impression  Pt was seen for cognitive-linguistic treatment. He was educated regarding the results of the assessment, recommendations, and the potential ramifications of his cognitive-linguistic deficits. He was cooperative and jovial during the session. He completed a mental manipulation task-sorting activity with 100% accuracy with self-correction and additional processing time. Pt demonstrated 60% accuracy with problem solving increasing to 80% with cues for reasoning. He completed an executive function (prescription) task with 60% accuracy increasing to 80% with cues. SLP will continue to follow pt. '   HPI HPI: Pt is a 59 y.o. male with medical history significant of  hyperlipidemia, hypertension, arrhythmia, CVA with residual right-sided weakness, TIA, obesity, CKD stage II, and GERD who presented initially to [facility] pm 11/20 with complaints of loss of vision in his left eye. MRI brain: Multiple small acute infarcts of the right PCA territory.      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 Travis Clock, MS, CCC-SLP Acute Rehabilitation Services Office number [phone] Pager [phone]                 Travis Chavez 04/28/2020, 10:24 AM

## 2020-04-28 NOTE — Progress Notes (Signed)
STROKE TEAM PROGRESS NOTE   INTERVAL HISTORY His family member is at the bedside.  I personally reviewed history of presenting illness with the patient, electronic medical records and imaging films in PACS.  He presented with left-sided vision loss and numbness.  MRI scan shows patchy right posterior cerebral artery infarct with MRA showing complete right posterior cerebral artery occlusion.  Echocardiogram shows normal ejection fraction without cardiac source of embolism.  He presented in August 2021 with TIA with left-sided numbness at that time he was found to have high-grade right PCA stenosis and was discharged on dual antiplatelet therapy. Vitals:   04/28/20 0752 04/28/20 0943 04/28/20 1205 04/28/20 1625  BP: (!) 146/101 (!) 128/101 121/77   Pulse: 73 80 79 77  Resp: 18 18 18    Temp: 98.5 F (36.9 C) 98.3 F (36.8 C) 98.6 F (37 C)   TempSrc: Oral Oral Oral   SpO2: 97% 97% 98%   Weight:      Height:       CBC:  Recent Labs  Lab 04/27/20 0746 04/28/20 0256  WBC 10.4 9.4  NEUTROABS 6.9  --   HGB 13.4 13.7  HCT 40.9 41.7  MCV 87.8 88.0  PLT 308 276   Basic Metabolic Panel:  Recent Labs  Lab 04/27/20 0746 04/28/20 0256  NA 141 143  K 3.5 3.2*  CL 107 107  CO2 25 27  GLUCOSE 100* 97  BUN 18 17  CREATININE 1.17 1.34*  CALCIUM 8.6* 8.7*   Lipid Panel:  Recent Labs  Lab 04/27/20 0746  CHOL 95  TRIG 72  HDL 37*  CHOLHDL 2.6  VLDL 14  LDLCALC 44   HgbA1c:  Recent Labs  Lab 04/27/20 0746  HGBA1C 6.0*   Urine Drug Screen: No results for input(s): LABOPIA, COCAINSCRNUR, LABBENZ, AMPHETMU, THCU, LABBARB in the last 168 hours.  Alcohol Level No results for input(s): ETH in the last 168 hours.  IMAGING past 24 hours No results found.  PHYSICAL EXAM Pleasant middle-aged African-American male not in distress.  . Afebrile. Head is nontraumatic. Neck is supple without bruit.    Cardiac exam no murmur or gallop. Lungs are clear to auscultation. Distal pulses are  well felt. Neurological Exam ;  Awake  Alert oriented x 3. Normal speech and language.eye movements full without nystagmus.fundi were not visualized. Vision acuity  appears normal but he has dense left homonymous hemianopsia.04/29/20 Hearing is normal. Palatal movements are normal. Face asymmetric with right lower facial weakness.. Tongue midline. Normal strength, tone, reflexes and coordination except mild diminished fine finger movements on the right and right grip weakness.  Diminished foot tapping on the right.  Tone is increased on the right compared to the left.. Normal sensation. Gait deferred.  ASSESSMENT/PLAN [name] is a 59 y.o. 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    MRI brain multiple small acute infarcts in the right PCA territory.  Subacute infarct in left splenium corpus callosum.  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  LDL 44  HgbA1c 6.0  VTE prophylaxis -SCDs    Diet   Diet Heart Room service appropriate? Yes; Fluid consistency: Thin     aspirin 81 mg daily prior to admission, now on aspirin  81 mg daily and clopidogrel 75 mg daily.    Therapy recommendations:  pending Disposition:  pending Hypertension  Home meds:   Amlodipine, Lasix, hydralazine, hydrochlorothiazide, Hytrin   Stable . Permissive hypertension (OK if < 220/120) but gradually normalize in 5-7 days . Long-term BP goal normotensive  Hyperlipidemia  Home meds:   Lipitor 80 mg resumed in hospital  LDL 44, goal < 70  Continue statin at discharge  Diabetes type II  Controlled  Home meds:  HgbA1c 6.0, goal < 7.0  CBGs  No results for input(s): GLUCAP in the last 72  hours.   SSI     He presented with right PCA infarct secondary to progressive intracranial atherosclerosis with right PCA occlusion.  Recommend aspirin Plavix for 3 months followed by Plavix alone and aggressive risk factor modification.  Patient advised not to drive due to vision loss.  Mobilize out of bed.  Ongoing therapy consults.  Greater than 50% time during this 35-minute visit was spent on counseling and coordination of care about his stroke and vision loss and answering questions.  Discussed with Dr. Hilda Blades day # 1  Delia Heady, MD  To contact Stroke Continuity provider, please refer to [email or web address]. After hours, contact General Neurology

## 2020-04-28 NOTE — Progress Notes (Signed)
PROGRESS NOTE    Travis Chavez  EXH:[id number] DOB: [birth year] DOA: 04/27/2020 PCP: Fanny Dance, FNP  Brief Narrative: 59 year old male with significant history of hyperlipidemia, hypertension, history of CVA with residual right-sided weakness, obesity, CKD stage II, GERD presented from [facility] 04/26/2020 with complaint of loss of vision in his left eye, he woke up around 6 AM and noticed that his left hand was tingly and went numb then unable to see from his left eye. Patient was seen at the outside facility underwent extensive work-up-refer to HPI for more detail.  He was admitted for further stroke work-up with MRI/MRA.  Subjective: C/o tingling on left finger, left sided peripheral visual defect persists. Patient otherwise denies any nausea, vomiting, chest pain, shortness of breath, fever, chills, headache, focal weakness,  speech difficulties  Assessment & Plan:  Acute right PCA territory ischemic infarction with a new right PCA occlusion with prior history of stroke: Appreciate neurology input, continue PT OT and speech follow-up.  Recent CT head and neck in August so not being repeated at this time.  Allow permissive hypertension, target of LDL at 44, at goal (less than 70).  Discussed with stroke team anticipating TEE in the morning-if TEE negative and no A. fib on the telemetry- will need Holter monitoring or loop recorder.  Continue on dual antiplatelet therapy with lifelong aspirin 81 and Plavix given multiple strokes and acute stroke while on monotherapy. Cont Lipitor 80 mg.  Mild renal insufficiency creatinine 1.3 from 1.1.  Monitor closely.  Gently hydrate overnight.  Hypokalemia will replete.  Essential hypertension: Blood pressure stable on amlodipine.  Leukocytosis resolved.  Prediabetes hemoglobin A1c 6.1 on 11/21.  Dietary counseling.  Morbid obesity with BMI 37 : PCP follow-up weight loss and healthy lifestyle will be beneficial   Nutrition: Diet Order             Diet Heart Room service appropriate? Yes; Fluid consistency: Thin  Diet effective now                Body mass index is 37.29 kg/m.  DVT prophylaxis: enoxaparin (LOVENOX) injection 40 mg Start: 04/27/20 1000 SCDs Start: 04/27/20 3810 Code Status:   Code Status: Full Code  Family Communication: plan of care discussed with patient at bedside.  Status is: Inpatient Remains inpatient appropriate because:For ongoing stroke work-up  Dispo:Patient From: Other Planned Disposition: Home with Health Care Svc Expected discharge date: 04/29/20 Medically stable for discharge: No  Consultants:see note  Procedures:see note  Culture/Microbiology No results found for: SDES, SPECREQUEST, CULT, REPTSTATUS  Other culture-see note  Medications: Scheduled Meds: . amLODipine  5 mg Oral BID  . aspirin EC  81 mg Oral Daily  . atorvastatin  80 mg Oral Daily  . clopidogrel  75 mg Oral Daily  . enoxaparin (LOVENOX) injection  40 mg Subcutaneous Daily   Continuous Infusions:  Antimicrobials: Anti-infectives (From admission, onward)   None     Objective: Vitals: Today's Vitals   04/28/20 0546 04/28/20 0752 04/28/20 0943 04/28/20 1022  BP:  (!) 146/101 (!) 128/101   Pulse:  73 80   Resp:  18 18   Temp:  98.5 F (36.9 C) 98.3 F (36.8 C)   TempSrc:  Oral Oral   SpO2:  97% 97%   Weight:      Height:      PainSc: 0-No pain   4    No intake or output data in the 24 hours ending 04/28/20 1030 Filed Weights  04/27/20 0555 04/27/20 1025 04/28/20 0545  Weight: 104.7 kg 104.7 kg 101.7 kg   Weight change: 0 kg  Intake/Output from previous day: 11/21 0701 - 11/22 0700 In: -  Out: 175 [Urine:175] Intake/Output this shift: No intake/output data recorded.  Examination: General exam: AAO ,NAD, weak appearing. HEENT:Oral mucosa moist, Ear/Nose WNL grossly,dentition normal. Respiratory system: bilaterally clear,no wheezing or crackles,no use of accessory muscle, non  tender. Cardiovascular system: S1 & S2 +, regular, No JVD. Gastrointestinal system: Abdomen soft, NT,ND, BS+. Nervous System:Alert, awake, moving extremities and with vision loss on peripheral side laterally on left eye. Extremities: No edema, distal peripheral pulses palpable.  Skin: No rashes,no icterus. MSK: Normal muscle bulk,tone, power  Data Reviewed: I have personally reviewed following labs and imaging studies CBC: Recent Labs  Lab 04/27/20 0746 04/28/20 0256  WBC 10.4 9.4  NEUTROABS 6.9  --   HGB 13.4 13.7  HCT 40.9 41.7  MCV 87.8 88.0  PLT 308 276   Basic Metabolic Panel: Recent Labs  Lab 04/27/20 0746 04/28/20 0256  NA 141 143  K 3.5 3.2*  CL 107 107  CO2 25 27  GLUCOSE 100* 97  BUN 18 17  CREATININE 1.17 1.34*  CALCIUM 8.6* 8.7*   GFR: Estimated Creatinine Clearance: 66.8 mL/min (A) (by C-G formula based on SCr of 1.34 mg/dL (H)). Liver Function Tests: Recent Labs  Lab 04/27/20 0746 04/28/20 0256  AST 16 12*  ALT 17 16  ALKPHOS 58 58  BILITOT 0.8 1.1  PROT 6.1* 5.8*  ALBUMIN 3.4* 3.5   No results for input(s): LIPASE, AMYLASE in the last 168 hours. No results for input(s): AMMONIA in the last 168 hours. Coagulation Profile: Recent Labs  Lab 04/28/20 0256  INR 1.1   Cardiac Enzymes: No results for input(s): CKTOTAL, CKMB, CKMBINDEX, TROPONINI in the last 168 hours. BNP (last 3 results) No results for input(s): PROBNP in the last 8760 hours. HbA1C: Recent Labs    04/27/20 0746  HGBA1C 6.0*   CBG: No results for input(s): GLUCAP in the last 168 hours. Lipid Profile: Recent Labs    04/27/20 0746  CHOL 95  HDL 37*  LDLCALC 44  TRIG 72  CHOLHDL 2.6   Thyroid Function Tests: No results for input(s): TSH, T4TOTAL, FREET4, T3FREE, THYROIDAB in the last 72 hours. Anemia Panel: No results for input(s): VITAMINB12, FOLATE, FERRITIN, TIBC, IRON, RETICCTPCT in the last 72 hours. Sepsis Labs: No results for input(s): PROCALCITON,  LATICACIDVEN in the last 168 hours.  No results found for this or any previous visit (from the past 240 hour(s)).   Radiology Studies: MR ANGIO HEAD WO CONTRAST  Result Date: 04/27/2020 CLINICAL DATA:  Episode of left-sided numbness and left vision loss EXAM: MRI HEAD WITHOUT CONTRAST MRA HEAD WITHOUT CONTRAST TECHNIQUE: Multiplanar, multiecho pulse sequences of the brain and surrounding structures were obtained without intravenous contrast. Angiographic images of the head were obtained using MRA technique without contrast. COMPARISON:  02/02/2020 FINDINGS: MRI HEAD Brain: There are multiple small foci of restricted diffusion in the right PCA territory involving occipital lobe, adjacent corpus callosum splenium, medial right temporal lobe including the hippocampus, and right thalamus. Small focus of diffusion hyperintensity with ADC iso- and hyperintensity along the left aspect of the splenium of the corpus callosum. There is a chronic infarct of the left corona radiata and basal ganglia with chronic blood products. Few additional small foci of susceptibility are present in the cerebral white matter and right cerebellum likely reflecting chronic microhemorrhages.  There are chronic small vessel infarcts of the ventral left frontal white matter and right cerebellum. Additional patchy and confluent T2 hyperintensity in the supratentorial white matter is nonspecific but probably reflects moderate to advanced chronic microvascular ischemic changes. There is no intracranial mass or significant mass effect. There is no hydrocephalus or extra-axial fluid collection. Vascular: Major vessel flow voids at the skull base are preserved. Skull and upper cervical spine: Normal marrow signal is preserved. Sinuses/Orbits: Minor mucosal thickening.  Orbits are unremarkable. Other: Sella is unremarkable.  Mastoid air cells are clear. MRA HEAD Intracranial internal carotid arteries are patent. Middle and anterior cerebral  arteries are patent. Intracranial vertebral arteries, basilar artery, and left posterior cerebral arteries are patent. 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 Bilateral posterior communicating arteries are present. There is no aneurysm. IMPRESSION: Multiple small acute infarcts of the right PCA territory as detailed above. 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 Probable small subacute infarct involving the left splenium of the corpus callosum. Moderate to advanced chronic microvascular ischemic changes. Few chronic infarcts as described. These results were called by telephone at the time of interpretation on 04/27/2020 at 3:08 pm to provider [facility] , who verbally acknowledged these results. Electronically Signed   By: Guadlupe Spanish M.D.   On: 04/27/2020 15:15   MR BRAIN WO CONTRAST  Result Date: 04/27/2020 CLINICAL DATA:  Episode of left-sided numbness and left vision loss EXAM: MRI HEAD WITHOUT CONTRAST MRA HEAD WITHOUT CONTRAST TECHNIQUE: Multiplanar, multiecho pulse sequences of the brain and surrounding structures were obtained without intravenous contrast. Angiographic images of the head were obtained using MRA technique without contrast. COMPARISON:  02/02/2020 FINDINGS: MRI HEAD Brain: There are multiple small foci of restricted diffusion in the right PCA territory involving occipital lobe, adjacent corpus callosum splenium, medial right temporal lobe including the hippocampus, and right thalamus. Small focus of diffusion hyperintensity with ADC iso- and hyperintensity along the left aspect of the splenium of the corpus callosum. There is a chronic infarct of the left corona radiata and basal ganglia with chronic blood products. Few additional small foci of susceptibility are present in the cerebral white matter and right cerebellum likely reflecting chronic microhemorrhages. There are chronic small vessel infarcts of the ventral left frontal white matter and right  cerebellum. Additional patchy and confluent T2 hyperintensity in the supratentorial white matter is nonspecific but probably reflects moderate to advanced chronic microvascular ischemic changes. There is no intracranial mass or significant mass effect. There is no hydrocephalus or extra-axial fluid collection. Vascular: Major vessel flow voids at the skull base are preserved. Skull and upper cervical spine: Normal marrow signal is preserved. Sinuses/Orbits: Minor mucosal thickening.  Orbits are unremarkable. Other: Sella is unremarkable.  Mastoid air cells are clear. MRA HEAD Intracranial internal carotid arteries are patent. Middle and anterior cerebral arteries are patent. Intracranial vertebral arteries, basilar artery, and left posterior cerebral arteries are patent. 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 Bilateral posterior communicating arteries are present. There is no aneurysm. IMPRESSION: Multiple small acute infarcts of the right PCA territory as detailed above. 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 Probable small subacute infarct involving the left splenium of the corpus callosum. Moderate to advanced chronic microvascular ischemic changes. Few chronic infarcts as described. These results were called by telephone at the time of interpretation on 04/27/2020 at 3:08 pm to provider [facility] , who verbally acknowledged these results. Electronically  Signed   By: Guadlupe Spanish M.D.   On: 04/27/2020 15:15   ECHOCARDIOGRAM LIMITED  Result Date: 04/27/2020    ECHOCARDIOGRAM LIMITED REPORT   Patient Name:   Travis Chavez Date of Exam: 04/27/2020 Medical Rec #:  [id number]        Height:       65.0 in Accession #:    [id number]       Weight:       230.8 lb Date of Birth:  [birth year]         BSA:          2.102 m Patient Age:    59 years         BP:           141/101 mmHg Patient Gender: M                HR:           78 bpm. Exam Location:  Inpatient Procedure: Limited Echo  Indications:    Stroke 434.91 / I163.9  History:        Patient has prior history of Echocardiogram examinations, most                 recent 02/03/2020. TIA; Risk Factors:Hypertension, Dyslipidemia                 and Non-Smoker. SVT.  Sonographer:    Jeryl Columbia Referring Phys: [phone] RONDELL A SMITH IMPRESSIONS  1. Left ventricular ejection fraction, by estimation, is 60 to 65%. The left ventricle has normal function. There is mild left ventricular hypertrophy. Left ventricular diastolic parameters are consistent with Grade I diastolic dysfunction (impaired relaxation).  2. The mitral valve is normal in structure. Trivial mitral valve regurgitation.  3. The aortic valve is normal in structure.  4. The inferior vena cava is normal in size with greater than 50% respiratory variability, suggesting right atrial pressure of 3 mmHg. Conclusion(s)/Recommendation(s): No intracardiac source of embolism detected on this transthoracic study. A transesophageal echocardiogram is recommended to exclude cardiac source of embolism if clinically indicated. FINDINGS  Left Ventricle: Left ventricular ejection fraction, by estimation, is 60 to 65%. The left ventricle has normal function. There is mild left ventricular hypertrophy. Left ventricular diastolic parameters are consistent with Grade I diastolic dysfunction (impaired relaxation). Mitral Valve: The mitral valve is normal in structure. Trivial mitral valve regurgitation. Tricuspid Valve: The tricuspid valve is normal in structure. Tricuspid valve regurgitation is trivial. Aortic Valve: The aortic valve is normal in structure. Venous: The inferior vena cava is normal in size with greater than 50% respiratory variability, suggesting right atrial pressure of 3 mmHg. LEFT VENTRICLE PLAX 2D LVIDd:         4.50 cm Diastology LVIDs:         2.90 cm LV e' medial:    11.30 cm/s LV PW:         1.40 cm LV E/e' medial:  4.7 LV IVS:        1.10 cm LV e' lateral:   9.14 cm/s                         LV E/e' lateral: 5.8  LEFT ATRIUM           Index LA diam:      3.50 cm 1.66 cm/m LA Vol (A4C): 25.1 ml 11.94 ml/m   AORTA Ao Root diam: 3.40 cm MITRAL VALVE MV Area (  PHT): 4.39 cm MV Decel Time: 173 msec MV E velocity: 53.10 cm/s MV A velocity: 56.10 cm/s MV E/A ratio:  0.95 Donato SchultzMark Skains MD Electronically signed by Donato SchultzMark Skains MD Signature Date/Time: 04/27/2020/2:09:17 PM    Final      LOS: 1 day   Lanae Boastamesh Rick Carruthers, MD [facility]  04/28/2020, 10:30 AM

## 2020-04-29 DIAGNOSIS — I6330 Cerebral infarction due to thrombosis of unspecified cerebral artery: Secondary | ICD-10-CM | POA: Insufficient documentation

## 2020-04-29 LAB — BASIC METABOLIC PANEL
Anion gap: 9 (ref 5–15)
BUN: 15 mg/dL (ref 6–20)
CO2: 24 mmol/L (ref 22–32)
Calcium: 8.6 mg/dL — ABNORMAL LOW (ref 8.9–10.3)
Chloride: 106 mmol/L (ref 98–111)
Creatinine, Ser: 1.2 mg/dL (ref 0.61–1.24)
GFR, Estimated: >60 mL/min (ref >60)
Glucose, Bld: 99 mg/dL (ref 70–99)
Potassium: 3.5 mmol/L (ref 3.5–5.1)
Sodium: 139 mmol/L (ref 135–145)

## 2020-04-29 LAB — RESPIRATORY PANEL BY RT PCR (FLU A&B, COVID)
Influenza A by PCR: NEGATIVE
Influenza B by PCR: NEGATIVE
SARS Coronavirus 2 by RT PCR: NEGATIVE

## 2020-04-29 MED ORDER — HYDRALAZINE HCL 50 MG PO TABS
50.0000 mg | ORAL_TABLET | Freq: Three times a day (TID) | ORAL | Status: DC
  Filled 2020-04-29: qty 1

## 2020-04-29 MED ORDER — TICAGRELOR 90 MG PO TABS: 90.0000 mg | ORAL_TABLET | Freq: Two times a day (BID) | ORAL | 1 refills | Status: AC

## 2020-04-29 NOTE — Progress Notes (Signed)
  Speech Language Pathology Treatment: Cognitive-Linquistic  Patient Details Name: Travis Chavez MRN: [id number] DOB: 09/03/1960 Today's Date: 04/29/2020 Time: 6720-9470 SLP Time Calculation (min) (ACUTE ONLY): 30 min  Assessment / Plan / Recommendation Clinical Impression  Pt was received in bed, awake, alert, and agreeable to participating in treatment.  Pt reported that he was doing "terrible" due to new visual deficits but he overall had a pleasant demeanor and bright if not slightly subdued affect.  Pt had difficulty anticipating assistance he may now need in the home environment in light of acute deficits despite min question cues but he demonstrated ability to recall specific details from previous ST evaluation and recommendations with supervision question cues. SLP encouraged that pt have assistance for managing medications and finances from family at discharge to minimize risk for error in light of acute deficits.  All questions were answered to pt's satisfaction at this time.  Pt was left in bed with bed alarm set and call bell within reach.  Continue per current plan of care.    HPI HPI: Pt is a 59 y.o. male with medical history significant of  hyperlipidemia, hypertension, arrhythmia, CVA with residual right-sided weakness, TIA, obesity, CKD stage II, and GERD who presented initially to [facility] pm 11/20 with complaints of loss of vision in his left eye. MRI brain: Multiple small acute infarcts of the right PCA territory.      SLP Plan  Continue with current plan of care       Recommendations                   Follow up Recommendations: Outpatient SLP SLP Visit Diagnosis: Cognitive communication deficit (J62.836) Plan: Continue with current plan of care       GO                Zanaya Baize, Melanee Spry 04/29/2020, 10:59 AM

## 2020-04-29 NOTE — Progress Notes (Signed)
STROKE TEAM PROGRESS NOTE   INTERVAL HISTORY His wife is at the bedside.   He continues to have dense left homonymous hemianopsia and has mild right-sided weakness from a previous stroke.  No new complaints.  Neurological  exam is unchanged. Vitals:   04/28/20 2255 04/28/20 2347 04/29/20 0450 04/29/20 0916  BP: (!) 149/99 (!) 136/96 (!) 144/113 (!) 141/99  Pulse:  74 71 69  Resp:  18 18 17   Temp:  98.5 F (36.9 C) 98.4 F (36.9 C) 98.2 F (36.8 C)  TempSrc:  Oral Oral Oral  SpO2:  96% 98% 96%  Weight:   102.7 kg   Height:       CBC:  Recent Labs  Lab 04/27/20 0746 04/28/20 0256  WBC 10.4 9.4  NEUTROABS 6.9  --   HGB 13.4 13.7  HCT 40.9 41.7  MCV 87.8 88.0  PLT 308 276   Basic Metabolic Panel:  Recent Labs  Lab 04/28/20 0256 04/29/20 0143  NA 143 139  K 3.2* 3.5  CL 107 106  CO2 27 24  GLUCOSE 97 99  BUN 17 15  CREATININE 1.34* 1.20  CALCIUM 8.7* 8.6*   Lipid Panel:  Recent Labs  Lab 04/27/20 0746  CHOL 95  TRIG 72  HDL 37*  CHOLHDL 2.6  VLDL 14  LDLCALC 44   HgbA1c:  Recent Labs  Lab 04/27/20 0746  HGBA1C 6.0*   Urine Drug Screen: No results for input(s): LABOPIA, COCAINSCRNUR, LABBENZ, AMPHETMU, THCU, LABBARB in the last 168 hours.  Alcohol Level No results for input(s): ETH in the last 168 hours.  IMAGING past 24 hours No results found.  PHYSICAL EXAM Pleasant middle-aged African-American male not in distress.  . Afebrile. Head is nontraumatic. Neck is supple without bruit.    Cardiac exam no murmur or gallop. Lungs are clear to auscultation. Distal pulses are well felt. Neurological Exam ;  Awake  Alert oriented x 3. Normal speech and language.eye movements full without nystagmus.fundi were not visualized. Vision acuity  appears normal but he has dense left homonymous hemianopsia.04/29/20 Hearing is normal. Palatal movements are normal. Face asymmetric with right lower facial weakness.. Tongue midline. Normal strength, tone, reflexes and  coordination except mild diminished fine finger movements on the right and right grip weakness.  Diminished foot tapping on the right.  Tone is increased on the right compared to the left.. Normal sensation. Gait deferred.  ASSESSMENT/PLAN [name] is a 59 y.o. 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    MRI brain multiple small acute infarcts in the right PCA territory.  Subacute infarct in left splenium corpus callosum.  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  LDL 44  HgbA1c 6.0  VTE prophylaxis -SCDs    Diet   Diet Heart Room service appropriate? Yes; Fluid consistency: Thin    aspirin 81 mg daily prior to admission, now on aspirin 81 mg daily and clopidogrel 75 mg daily.    Therapy recommendations:  pending Disposition:  pending Hypertension  Home meds:   Amlodipine, Lasix, hydralazine, hydrochlorothiazide, Hytrin   Stable . Permissive hypertension (OK if < 220/120) but gradually normalize in 5-7 days . Long-term BP goal normotensive  Hyperlipidemia  Home meds:   Lipitor  80 mg resumed in hospital  LDL 44, goal < 70  Continue statin at discharge  Diabetes type II  Controlled  Home meds:  HgbA1c 6.0, goal < 7.0  CBGs  No results for input(s): GLUCAP in the last 72 hours.   SSI     He presented with right PCA infarct secondary to progressive intracranial atherosclerosis with right PCA occlusion.  Recommend aspirin Plavix for 3 months followed by Plavix alone and aggressive risk factor modification.  Patient advised not to drive due to vision loss.  Patient may be discharged home with outpatient therapies.  Follow-up as an outpatient stroke  clinic in 6 weeks.  Long discussion with patient and wife and answered questions.  Greater than 50% time during this 25-minute visit was spent on counseling and coordination of care about his stroke and vision loss and answering questions.  Discussed with Dr. Hilda Blades day # 2  Delia Heady, MD  To contact Stroke Continuity provider, please refer to [email or web address]. After hours, contact General Neurology

## 2020-04-29 NOTE — Discharge Summary (Signed)
Physician Discharge Summary  Travis Chavez PZW:[id number] DOB: 05-13-1961 DOA: 04/27/2020  PCP: Fanny Dance, FNP  Admit date: 04/27/2020 Discharge date: 04/29/2020  Admitted From: home Disposition:  home  Recommendations for Outpatient Follow-up:  1. Follow up with PCP in 1-2 weeks and w/ neurology. 2. Please obtain BMP/CBC in one week 3. Please follow up on the following pending results:  Home Health:no  Equipment/Devices: none  Discharge Condition: Stable Code Status:   Code Status: Full Code Diet recommendation:  Diet Order            Diet - low sodium heart healthy           Diet Heart Room service appropriate? Yes; Fluid consistency: Thin  Diet effective now                 Brief/Interim Summary: 59 year old male with significant history of hyperlipidemia, hypertension, history of CVA with residual right-sided weakness, obesity, CKD stage II, GERD presented from [facility] 04/26/2020 with complaint of loss of vision in his left eye, he woke up around 6 AM and noticed that his left hand was tingly and went numb then unable to see from his left eye. Patient was seen at the outside facility underwent extensive work-up-refer to HPI for more detail.  He was admitted for further stroke work-up with MRI/MRA. Patient was found to have acute right PCA territory ischemic infarction, seen by neurologist and advised to keep on dual antiplatelet preferably Brilinta if covered by insurance after which will just hold aspirin and continue the rest.  Discharge Diagnoses:  Acute right PCA territory ischemic infarction secondary to progressive intracranial atherosclerosis with right PCA occlusion :Recent CT head and neck in August so not being repeated at this time.  Allow permissive hypertension, target of LDL at 44, at goal (less than 70).  As per neurology no further work-up, he will need to continue dual antiplatelet for 3 months afterwards hold aspirin.  Per Dr. Pearlean Brownie advised to  place on Brilinta rather than Plavix and co pay is only $3 and he will go on Brilinta as asa x 3 months then brilinita alone.Cont Lipitor 80 mg.  Discussed with neurologist Dr. Pearlean Brownie today during rounds. Discussed w; RN staff.  UMP:[id number] 1.3 from 1.1.  Improved with hydration overnight.  Hypokalemia  resolved.  Essential hypertension:  Blood pressure borderline controlled continue amlodipine, resume hydralazine and Lasix HCTZ and Hytrin and follow-up with PCP.  Leukocytosis resolved.  Prediabetes hemoglobin A1c 6.1 on 11/21.  Dietary counseling.  Morbid obesity with BMI 37 : PCP follow-up weight loss and healthy lifestyle will be beneficial   Consults:  neurology  Subjective: Vision is improving, aaox3, no new complaints  Discharge Exam: Vitals:   04/29/20 0450 04/29/20 0916  BP: (!) 144/113 (!) 141/99  Pulse: 71 69  Resp: 18 17  Temp: 98.4 F (36.9 C) 98.2 F (36.8 C)  SpO2: 98% 96%   General: Pt is alert, awake, not in acute distress Cardiovascular: RRR, S1/S2 +, no rubs, no gallops Respiratory: CTA bilaterally, no wheezing, no rhonchi Abdominal: Soft, NT, ND, bowel sounds + Extremities: no edema, no cyanosis  Discharge Instructions  Discharge Instructions    Ambulatory referral to Neurology   Complete by: As directed    An appointment is requested in approximately: call office   Diet - low sodium heart healthy   Complete by: As directed    Discharge instructions   Complete by: As directed    Please call call  MD or return to ER for similar or worsening recurring problem that brought you to hospital or if any fever,nausea/vomiting,abdominal pain, uncontrolled pain, chest pain,  shortness of breath or any other alarming symptoms.  Please follow-up your doctor as instructed in a week time and call the office for appointment.  Please avoid alcohol, smoking, or any other illicit substance and maintain healthy habits including taking your regular medications  as prescribed.  You were cared for by a hospitalist during your hospital stay. If you have any questions about your discharge medications or the care you received while you were in the hospital after you are discharged, you can call the unit and ask to speak with the hospitalist on call if the hospitalist that took care of you is not available.  Once you are discharged, your primary care physician will handle any further medical issues. Please note that NO REFILLS for any discharge medications will be authorized once you are discharged, as it is imperative that you return to your primary care physician (or establish a relationship with a primary care physician if you do not have one) for your aftercare needs so that they can reassess your need for medications and monitor your lab values   Increase activity slowly   Complete by: As directed      Allergies as of 04/29/2020      Reactions   Beta Adrenergic Blockers Nausea And Vomiting   SOB      Medication List    STOP taking these medications   predniSONE 20 MG tablet Commonly known as: DELTASONE     TAKE these medications   amLODipine 5 MG tablet Commonly known as: NORVASC Take 5 mg by mouth 2 (two) times daily.   aspirin 81 MG EC tablet Take 1 tablet (81 mg total) by mouth daily. Swallow whole.   atorvastatin 80 MG tablet Commonly known as: LIPITOR Take 1 tablet (80 mg total) by mouth daily.   cyclobenzaprine 10 MG tablet Commonly known as: FLEXERIL Take 10 mg by mouth 3 (three) times daily.   furosemide 20 MG tablet Commonly known as: LASIX Take 1 tablet (20 mg total) by mouth daily.   hydrALAZINE 50 MG tablet Commonly known as: APRESOLINE Take 1 tablet (50 mg total) by mouth every 8 (eight) hours.   hydrochlorothiazide 25 MG tablet Commonly known as: HYDRODIURIL Take 1 tablet (25 mg total) by mouth daily.   potassium chloride SA 20 MEQ tablet Commonly known as: KLOR-CON Take 1 tablet (20 mEq total) by mouth  daily.   terazosin 2 MG capsule Commonly known as: HYTRIN Take 2 mg by mouth 2 (two) times daily.   ticagrelor 90 MG Tabs tablet Commonly known as: Brilinta Take 1 tablet (90 mg total) by mouth 2 (two) times daily.   vitamin B-12 1000 MCG tablet Commonly known as: CYANOCOBALAMIN Take 1,000 mcg by mouth daily.       Follow-up Information    Cobb, Glenice Laine, FNP Follow up in 1 week(s).   Specialty: Family Medicine Contact information: [address] [place] [address] [phone]        Jake Bathe, MD .   Specialty: Cardiology Contact information: [phone] [address] [place] [address] [phone]              Allergies  Allergen Reactions  . Beta Adrenergic Blockers Nausea And Vomiting    SOB    The results of significant diagnostics from this hospitalization (  including imaging, microbiology, ancillary and laboratory) are listed below for reference.    Microbiology: Recent Results (from the past 240 hour(s))  Respiratory Panel by RT PCR (Flu A&B, Covid) - Nasopharyngeal Swab     Status: None   Collection Time: 04/29/20  4:54 AM   Specimen: Nasopharyngeal Swab; Nasopharyngeal(NP) swabs in vial transport medium  Result Value Ref Range Status   SARS Coronavirus 2 by RT PCR NEGATIVE NEGATIVE Final    Comment: (NOTE) SARS-CoV-2 target nucleic acids are NOT DETECTED.  The SARS-CoV-2 RNA is generally detectable in upper respiratoy specimens during the acute phase of infection. The lowest concentration of SARS-CoV-2 viral copies this assay can detect is 131 copies/mL. A negative result does not preclude SARS-Cov-2 infection and should not be used as the sole basis for treatment or other patient management decisions. A negative result may occur with  improper specimen collection/handling, submission of specimen other than nasopharyngeal swab, presence of viral mutation(s) within the areas targeted by this assay, and  inadequate number of viral copies (<131 copies/mL). A negative result must be combined with clinical observations, patient history, and epidemiological information. The expected result is Negative.  Fact Sheet for Patients:  https://www.moore.com/  Fact Sheet for Healthcare Providers:  https://www.young.biz/  This test is no t yet approved or cleared by the [place] FDA and  has been authorized for detection and/or diagnosis of SARS-CoV-2 by FDA under an Emergency Use Authorization (EUA). This EUA will remain  in effect (meaning this test can be used) for the duration of the COVID-19 declaration under Section 564(b)(1) of the Act, 21 U.S.C. section 360bbb-3(b)(1), unless the authorization is terminated or revoked sooner.     Influenza A by PCR NEGATIVE NEGATIVE Final   Influenza B by PCR NEGATIVE NEGATIVE Final    Comment: (NOTE) The Xpert Xpress SARS-CoV-2/FLU/RSV assay is intended as an aid in  the diagnosis of influenza from Nasopharyngeal swab specimens and  should not be used as a sole basis for treatment. Nasal washings and  aspirates are unacceptable for Xpert Xpress SARS-CoV-2/FLU/RSV  testing.  Fact Sheet for Patients: https://www.moore.com/  Fact Sheet for Healthcare Providers: https://www.young.biz/  This test is not yet approved or cleared by the [place] FDA and  has been authorized for detection and/or diagnosis of SARS-CoV-2 by  FDA under an Emergency Use Authorization (EUA). This EUA will remain  in effect (meaning this test can be used) for the duration of the  Covid-19 declaration under Section 564(b)(1) of the Act, 21  U.S.C. section 360bbb-3(b)(1), unless the authorization is  terminated or revoked. Performed at [facility], [address] [place], [place] [address]     Procedures/Studies: MR ANGIO HEAD WO CONTRAST  Result Date: 04/27/2020 CLINICAL DATA:   Episode of left-sided numbness and left vision loss EXAM: MRI HEAD WITHOUT CONTRAST MRA HEAD WITHOUT CONTRAST TECHNIQUE: Multiplanar, multiecho pulse sequences of the brain and surrounding structures were obtained without intravenous contrast. Angiographic images of the head were obtained using MRA technique without contrast. COMPARISON:  02/02/2020 FINDINGS: MRI HEAD Brain: There are multiple small foci of restricted diffusion in the right PCA territory involving occipital lobe, adjacent corpus callosum splenium, medial right temporal lobe including the hippocampus, and right thalamus. Small focus of diffusion hyperintensity with ADC iso- and hyperintensity along the left aspect of the splenium of the corpus callosum. There is a chronic infarct of the left corona radiata and basal ganglia with chronic blood products. Few additional small foci of susceptibility  are present in the cerebral white matter and right cerebellum likely reflecting chronic microhemorrhages. There are chronic small vessel infarcts of the ventral left frontal white matter and right cerebellum. Additional patchy and confluent T2 hyperintensity in the supratentorial white matter is nonspecific but probably reflects moderate to advanced chronic microvascular ischemic changes. There is no intracranial mass or significant mass effect. There is no hydrocephalus or extra-axial fluid collection. Vascular: Major vessel flow voids at the skull base are preserved. Skull and upper cervical spine: Normal marrow signal is preserved. Sinuses/Orbits: Minor mucosal thickening.  Orbits are unremarkable. Other: Sella is unremarkable.  Mastoid air cells are clear. MRA HEAD Intracranial internal carotid arteries are patent. Middle and anterior cerebral arteries are patent. Intracranial vertebral arteries, basilar artery, and left posterior cerebral arteries are patent. 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 Bilateral posterior communicating arteries are present.  There is no aneurysm. IMPRESSION: Multiple small acute infarcts of the right PCA territory as detailed above. 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 Probable small subacute infarct involving the left splenium of the corpus callosum. Moderate to advanced chronic microvascular ischemic changes. Few chronic infarcts as described. These results were called by telephone at the time of interpretation on 04/27/2020 at 3:08 pm to provider [facility] , who verbally acknowledged these results. Electronically Signed   By: Guadlupe Spanish M.D.   On: 04/27/2020 15:15   MR BRAIN WO CONTRAST  Result Date: 04/27/2020 CLINICAL DATA:  Episode of left-sided numbness and left vision loss EXAM: MRI HEAD WITHOUT CONTRAST MRA HEAD WITHOUT CONTRAST TECHNIQUE: Multiplanar, multiecho pulse sequences of the brain and surrounding structures were obtained without intravenous contrast. Angiographic images of the head were obtained using MRA technique without contrast. COMPARISON:  02/02/2020 FINDINGS: MRI HEAD Brain: There are multiple small foci of restricted diffusion in the right PCA territory involving occipital lobe, adjacent corpus callosum splenium, medial right temporal lobe including the hippocampus, and right thalamus. Small focus of diffusion hyperintensity with ADC iso- and hyperintensity along the left aspect of the splenium of the corpus callosum. There is a chronic infarct of the left corona radiata and basal ganglia with chronic blood products. Few additional small foci of susceptibility are present in the cerebral white matter and right cerebellum likely reflecting chronic microhemorrhages. There are chronic small vessel infarcts of the ventral left frontal white matter and right cerebellum. Additional patchy and confluent T2 hyperintensity in the supratentorial white matter is nonspecific but probably reflects moderate to advanced chronic microvascular ischemic changes. There is no intracranial  mass or significant mass effect. There is no hydrocephalus or extra-axial fluid collection. Vascular: Major vessel flow voids at the skull base are preserved. Skull and upper cervical spine: Normal marrow signal is preserved. Sinuses/Orbits: Minor mucosal thickening.  Orbits are unremarkable. Other: Sella is unremarkable.  Mastoid air cells are clear. MRA HEAD Intracranial internal carotid arteries are patent. Middle and anterior cerebral arteries are patent. Intracranial vertebral arteries, basilar artery, and left posterior cerebral arteries are patent. 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 Bilateral posterior communicating arteries are present. There is no aneurysm. IMPRESSION: Multiple small acute infarcts of the right PCA territory as detailed above. 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 Probable small subacute infarct involving the left splenium of the corpus callosum. Moderate to advanced chronic microvascular ischemic changes. Few chronic infarcts as described. These results were called by telephone at the time of interpretation on 04/27/2020  at 3:08 pm to provider [facility] , who verbally acknowledged these results. Electronically Signed   By: Guadlupe Spanish M.D.   On: 04/27/2020 15:15   ECHOCARDIOGRAM LIMITED  Result Date: 04/27/2020    ECHOCARDIOGRAM LIMITED REPORT   Patient Name:   Travis Chavez Date of Exam: 04/27/2020 Medical Rec #:  [id number]        Height:       65.0 in Accession #:    [id number]       Weight:       230.8 lb Date of Birth:  09-04-1960         BSA:          2.102 m Patient Age:    59 years         BP:           141/101 mmHg Patient Gender: M                HR:           78 bpm. Exam Location:  Inpatient Procedure: Limited Echo Indications:    Stroke 434.91 / I163.9  History:        Patient has prior history of Echocardiogram examinations, most                 recent 02/03/2020. TIA; Risk Factors:Hypertension, Dyslipidemia                 and  Non-Smoker. SVT.  Sonographer:    Jeryl Columbia Referring Phys: [phone] RONDELL A SMITH IMPRESSIONS  1. Left ventricular ejection fraction, by estimation, is 60 to 65%. The left ventricle has normal function. There is mild left ventricular hypertrophy. Left ventricular diastolic parameters are consistent with Grade I diastolic dysfunction (impaired relaxation).  2. The mitral valve is normal in structure. Trivial mitral valve regurgitation.  3. The aortic valve is normal in structure.  4. The inferior vena cava is normal in size with greater than 50% respiratory variability, suggesting right atrial pressure of 3 mmHg. Conclusion(s)/Recommendation(s): No intracardiac source of embolism detected on this transthoracic study. A transesophageal echocardiogram is recommended to exclude cardiac source of embolism if clinically indicated. FINDINGS  Left Ventricle: Left ventricular ejection fraction, by estimation, is 60 to 65%. The left ventricle has normal function. There is mild left ventricular hypertrophy. Left ventricular diastolic parameters are consistent with Grade I diastolic dysfunction (impaired relaxation). Mitral Valve: The mitral valve is normal in structure. Trivial mitral valve regurgitation. Tricuspid Valve: The tricuspid valve is normal in structure. Tricuspid valve regurgitation is trivial. Aortic Valve: The aortic valve is normal in structure. Venous: The inferior vena cava is normal in size with greater than 50% respiratory variability, suggesting right atrial pressure of 3 mmHg. LEFT VENTRICLE PLAX 2D LVIDd:         4.50 cm Diastology LVIDs:         2.90 cm LV e' medial:    11.30 cm/s LV PW:         1.40 cm LV E/e' medial:  4.7 LV IVS:        1.10 cm LV e' lateral:   9.14 cm/s                        LV E/e' lateral: 5.8  LEFT ATRIUM           Index LA diam:      3.50 cm 1.66 cm/m LA Vol (A4C): 25.1 ml 11.94  ml/m   AORTA Ao Root diam: 3.40 cm MITRAL VALVE MV Area (PHT): 4.39 cm MV Decel Time: 173  msec MV E velocity: 53.10 cm/s MV A velocity: 56.10 cm/s MV E/A ratio:  0.95 Donato Schultz MD Electronically signed by Donato Schultz MD Signature Date/Time: 04/27/2020/2:09:17 PM    Final     Labs: BNP (last 3 results) No results for input(s): BNP in the last 8760 hours. Basic Metabolic Panel: Recent Labs  Lab 04/27/20 0746 04/28/20 0256 04/29/20 0143  NA 141 143 139  K 3.5 3.2* 3.5  CL 107 107 106  CO2 GLUCOSE 100* 97 99  BUN CREATININE 1.17 1.34* 1.20  CALCIUM 8.6* 8.7* 8.6*   Liver Function Tests: Recent Labs  Lab 04/27/20 0746 04/28/20 0256  AST 16 12*  ALT 17 16  ALKPHOS 58 58  BILITOT 0.8 1.1  PROT 6.1* 5.8*  ALBUMIN 3.4* 3.5   No results for input(s): LIPASE, AMYLASE in the last 168 hours. No results for input(s): AMMONIA in the last 168 hours. CBC: Recent Labs  Lab 04/27/20 0746 04/28/20 0256  WBC 10.4 9.4  NEUTROABS 6.9  --   HGB 13.4 13.7  HCT 40.9 41.7  MCV 87.8 88.0  PLT 308 276   Cardiac Enzymes: No results for input(s): CKTOTAL, CKMB, CKMBINDEX, TROPONINI in the last 168 hours. BNP: Invalid input(s): POCBNP CBG: No results for input(s): GLUCAP in the last 168 hours. D-Dimer No results for input(s): DDIMER in the last 72 hours. Hgb A1c Recent Labs    04/27/20 0746  HGBA1C 6.0*   Lipid Profile Recent Labs    04/27/20 0746  CHOL 95  HDL 37*  LDLCALC 44  TRIG 72  CHOLHDL 2.6   Thyroid function studies No results for input(s): TSH, T4TOTAL, T3FREE, THYROIDAB in the last 72 hours.  Invalid input(s): FREET3 Anemia work up No results for input(s): VITAMINB12, FOLATE, FERRITIN, TIBC, IRON, RETICCTPCT in the last 72 hours. Urinalysis No results found for: COLORURINE, APPEARANCEUR, LABSPEC, PHURINE, GLUCOSEU, HGBUR, BILIRUBINUR, KETONESUR, PROTEINUR, UROBILINOGEN, NITRITE, LEUKOCYTESUR Sepsis Labs Invalid input(s): PROCALCITONIN,  WBC,  LACTICIDVEN Microbiology Recent Results (from the past 240 hour(s))  Respiratory  Panel by RT PCR (Flu A&B, Covid) - Nasopharyngeal Swab     Status: None   Collection Time: 04/29/20  4:54 AM   Specimen: Nasopharyngeal Swab; Nasopharyngeal(NP) swabs in vial transport medium  Result Value Ref Range Status   SARS Coronavirus 2 by RT PCR NEGATIVE NEGATIVE Final    Comment: (NOTE) SARS-CoV-2 target nucleic acids are NOT DETECTED.  The SARS-CoV-2 RNA is generally detectable in upper respiratoy specimens during the acute phase of infection. The lowest concentration of SARS-CoV-2 viral copies this assay can detect is 131 copies/mL. A negative result does not preclude SARS-Cov-2 infection and should not be used as the sole basis for treatment or other patient management decisions. A negative result may occur with  improper specimen collection/handling, submission of specimen other than nasopharyngeal swab, presence of viral mutation(s) within the areas targeted by this assay, and inadequate number of viral copies (<131 copies/mL). A negative result must be combined with clinical observations, patient history, and epidemiological information. The expected result is Negative.  Fact Sheet for Patients:  https://www.moore.com/  Fact Sheet for Healthcare Providers:  https://www.young.biz/  This test is no t yet approved or cleared by the [place] FDA and  has been authorized for detection and/or diagnosis of SARS-CoV-2 by FDA under an Emergency Use  Authorization (EUA). This EUA will remain  in effect (meaning this test can be used) for the duration of the COVID-19 declaration under Section 564(b)(1) of the Act, 21 U.S.C. section 360bbb-3(b)(1), unless the authorization is terminated or revoked sooner.     Influenza A by PCR NEGATIVE NEGATIVE Final   Influenza B by PCR NEGATIVE NEGATIVE Final    Comment: (NOTE) The Xpert Xpress SARS-CoV-2/FLU/RSV assay is intended as an aid in  the diagnosis of influenza from Nasopharyngeal swab  specimens and  should not be used as a sole basis for treatment. Nasal washings and  aspirates are unacceptable for Xpert Xpress SARS-CoV-2/FLU/RSV  testing.  Fact Sheet for Patients: https://www.moore.com/https://www.fda.gov/media/142436/download  Fact Sheet for Healthcare Providers: https://www.young.biz/https://www.fda.gov/media/142435/download  This test is not yet approved or cleared by the [place] FDA and  has been authorized for detection and/or diagnosis of SARS-CoV-2 by  FDA under an Emergency Use Authorization (EUA). This EUA will remain  in effect (meaning this test can be used) for the duration of the  Covid-19 declaration under Section 564(b)(1) of the Act, 21  U.S.C. section 360bbb-3(b)(1), unless the authorization is  terminated or revoked. Performed at [facility], [address] [place], [place] [address]      Time coordinating discharge: 25 minutes  SIGNED: Lanae Boastamesh Jorma Tassinari, MD  [facility] 04/29/2020, 11:25 AM  If 7PM-7AM, please contact night-coverage [email or web address]

## 2020-04-29 NOTE — Discharge Instructions (Signed)
Hospital Discharge After a Stroke  Being discharged from the hospital after a stroke can feel overwhelming. Many things may be different, and it is normal to feel scared or anxious. Some stroke survivors may be able to return to their homes, and others may need more specialized care on a temporary or permanent basis. Your stroke care team will work with you to develop a discharge plan that is best for you. Ask questions if you do not understand something. Invite a friend or family member to participate in discharge planning. Understanding and following your discharge plan can help to prevent another stroke or other problems. Understanding your medicines After a stroke, your health care provider may prescribe one or more types of medicine. It is important to take medicines exactly as told by your health care provider. Serious harm, such as another stroke, can happen if you are unable to take your medicine exactly as prescribed. Make sure you understand:  What medicine to take.  Why you are taking the medicine.  How and when to take it.  If it can be taken with your other medicines and herbal supplements.  Possible side effects.  When to call your health care provider if you have any side effects.  How you will get and pay for your medicines. Medical assistance programs may be able to help you pay for prescription medicines if you cannot afford them. If you are taking an anticoagulant, be sure to take it exactly as told by your health care provider. This type of medicine can increase the risk of bleeding because it works to prevent blood from clotting. You may need to take certain precautions to prevent bleeding. You should contact your health care provider if you have:  Bleeding or bruising.  A fall or other injury to your head.  Blood in your urine or stool (feces). Planning for home safety  Take steps to prevent falls, such as installing grab bars or using a shower chair. Ask a friend or  family member to get needed things in place before you go home if possible. A therapist can come to your home to make recommendations for safety equipment. Ask your health care provider if you would benefit from this service or from home care. Getting needed equipment Ask your health care provider for a list of any medical equipment and supplies you will need at home. These may include items such as:  Walkers.  Canes.  Wheelchairs.  Hand-strengthening devices.  Special eating utensils. Medical equipment can be rented or purchased, depending on your insurance coverage. Check with your insurance company about what is covered. Keeping follow-up visits After a stroke, you will need to follow up regularly with a health care provider. You may also need rehabilitation, which can include physical therapy, occupational therapy, or speech-language therapy. Keeping these appointments is very important to your recovery after a stroke. Be sure to bring your medicine list and discharge papers with you to your appointments. If you need help to keep track of your schedule, use a calendar or appointment reminder. Preventing another stroke Having a stroke puts you at risk for another stroke in the future. Ask your health care provider what actions you can take to lower the risk. These may include:  Increasing how much you exercise.  Making a healthy eating plan.  Quitting smoking.  Managing other health conditions, such as high blood pressure, high cholesterol, or diabetes.  Limiting alcohol use. Knowing the warning signs of a stroke  Make sure  you understand the signs of a stroke. Before you leave the hospital, you will receive information outlining the stroke warning signs. Share these with your friends and family members. "BE FAST" is an easy way to remember the main warning signs of a stroke:  B - Balance. Signs are dizziness, sudden trouble walking, or loss of balance.  E - Eyes. Signs are  trouble seeing or a sudden change in vision.  F - Face. Signs are sudden weakness or numbness of the face, or the face or eyelid drooping on one side.  A - Arms. Signs are weakness or numbness in an arm. This happens suddenly and usually on one side of the body.  S - Speech. Signs are sudden trouble speaking, slurred speech, or trouble understanding what people say.  T - Time. Time to call emergency services. Write down what time symptoms started. Other signs of stroke may include:  A sudden, severe headache with no known cause.  Nausea or vomiting.  Seizure. These symptoms may represent a serious problem that is an emergency. Do not wait to see if the symptoms will go away. Get medical help right away. Call your local emergency services (911 in the [place] Do not drive yourself to the hospital. Make note of the time that you had your first symptoms. Your emergency responders or emergency room staff will need to know this information. Summary  Being discharged from the hospital after a stroke can feel overwhelming. It is normal to feel scared or anxious.  Make sure you take medicines exactly as told by your health care provider.  Know the warning signs of a stroke, and get help right way if you have any of these symptoms. "BE FAST" is an easy way to remember the main warning signs of a stroke. This information is not intended to replace advice given to you by your health care provider. Make sure you discuss any questions you have with your health care provider. Document Revised: 02/14/2019 Document Reviewed: 08/27/2016 Elsevier Patient Education  2020 Elsevier Inc[employer]   Ischemic Stroke  An ischemic stroke is the sudden death of brain tissue. Blood carries oxygen to all areas of the body. This type of stroke happens when your blood does not flow to your brain like normal. Your brain cannot get the oxygen it needs. This is an emergency. It must be treated right away. Symptoms of a stroke  usually happen all of a sudden. You may notice them when you wake up. They can include:  Weakness or loss of feeling in your face, arm, or leg. This often happens on one side of the body.  Trouble walking.  Trouble moving your arms or legs.  Loss of balance or coordination.  Feeling confused.  Trouble talking or understanding what people are saying.  Slurred speech.  Trouble seeing.  Seeing two of one object (double vision).  Feeling dizzy.  Feeling sick to your stomach (nauseous) and throwing up (vomiting).  A very bad headache for no reason. Get help as soon as any of these problems start. This is important. Some treatments work better if they are given right away. These include:  Aspirin.  Medicines to control blood pressure.  A shot (injection) of medicine to break up the blood clot.  Treatments given in the blood vessel (artery) to take out the clot or break it up. Other treatments may include:  Oxygen.  Fluids given through an IV tube.  Medicines to thin out your blood.  Procedures to   help your blood flow better. What increases the risk? Certain things may make you more likely to have a stroke. Some of these are things that you can change, such as:  Being very overweight (obesity).  Smoking.  Taking birth control pills.  Not being active.  Drinking too much alcohol.  Using drugs. Other risk factors include:  High blood pressure.  High cholesterol.  Diabetes.  Heart disease.  Being [place] American, Native American, Hispanic, or [place] Native.  Being over age 23.  Family history of stroke.  Having had blood clots, stroke, or warning stroke (transient ischemic attack, TIA) in the past.  Sickle cell disease.  Being a woman with a history of high blood pressure in pregnancy (preeclampsia).  Migraine headache.  Sleep apnea.  Having an irregular heartbeat (atrial fibrillation).  Long-term (chronic) diseases that cause soreness and  swelling (inflammation).  Disorders that affect how your blood clots. Follow these instructions at home: Medicines  Take over-the-counter and prescription medicines only as told by your doctor.  If you were told to take aspirin or another medicine to thin your blood, take it exactly as told by your doctor. ? Taking too much of the medicine can cause bleeding. ? If you do not take enough, it may not work as well.  Know the side effects of your medicines. If you are taking a blood thinner, make sure you: ? Hold pressure over any cuts for longer than usual. ? Tell your dentist and other doctors that you take this medicine. ? Avoid activities that may cause damage or injury to your body. Eating and drinking  Follow instructions from your doctor about what you cannot eat or drink.  Eat healthy foods.  If you have trouble with swallowing, do these things to avoid choking: ? Take small bites when eating. ? Eat foods that are soft or pureed. Safety  Follow instructions from your health care team about physical activity.  Use a walker or cane as told by your doctor.  Keep your home safe so you do not fall. This may include: ? Having experts look at your home to make sure it is safe. ? Putting grab bars in the bedroom and bathroom. ? Using raised toilets. ? Putting a seat in the shower. General instructions  Do not use any tobacco products. ? Examples of these are cigarettes, chewing tobacco, and e-cigarettes. ? If you need help quitting, ask your doctor.  Limit how much alcohol you drink. This means no more than 1 drink a day for nonpregnant women and 2 drinks a day for men. One drink equals 12 oz of beer, 5 oz of wine, or 1 oz of hard liquor.  If you need help to stop using drugs or alcohol, ask your doctor to refer you to a program or specialist.  Stay active. Exercise as told by your doctor.  Keep all follow-up visits as told by your doctor. This is important. Get help  right away if:   You have any signs of a stroke. "BE FAST" is an easy way to remember the main warning signs: ? B - Balance. Signs are dizziness, sudden trouble walking, or loss of balance. ? E - Eyes. Signs are trouble seeing or a change in how you see. ? F - Face. Signs are sudden weakness or loss of feeling of the face, or the face or eyelid drooping on one side. ? A - Arms. Signs are weakness or loss of feeling in an arm. This happens  suddenly and usually on one side of the body. ? S - Speech. Signs are sudden trouble speaking, slurred speech, or trouble understanding what people say. ? T - Time. Time to call emergency services. Write down what time symptoms started.  You have other signs of a stroke, such as: ? A sudden, very bad headache with no known cause. ? Feeling sick to your stomach (nausea). ? Throwing up (vomiting). ? Jerky movements you cannot control (seizure). These symptoms may be an emergency. Do not wait to see if the symptoms will go away. Get medical help right away. Call your local emergency services (911 in the [place] Do not drive yourself to the hospital. Summary  An ischemic stroke is the sudden death of brain tissue.  Symptoms of a stroke usually happen all of a sudden. You may notice them when you wake up.  Get help if you have any warning signs of a stroke. This is important. Some treatments work better if they are given right away. This information is not intended to replace advice given to you by your health care provider. Make sure you discuss any questions you have with your health care provider. Document Revised: 11/02/2017 Document Reviewed: 08/20/2015 Elsevier Patient Education  [place].

## 2020-04-29 NOTE — Progress Notes (Signed)
Physical Therapy Treatment Patient Details Name: Travis Chavez MRN: [id number] DOB: 1960/06/12 Today's Date: 04/29/2020    History of Present Illness Pt is a 59 yo male s/p MRI brain: multiple acute infarcts in the right PCA territory, as well as subacute infarcts in the left corpus collosum, chronic infarcts in the left corona radiata and basal ganglia, as well as the right cerebellum. PMHx:  HLD, HTN, GERD, arrhythmia, CVA with residual right-sided weakness (2012) , TIA, obesity, CKD stage II, left basal ganglia hemorrhage    PT Comments    Pt making good progress. Eager to return home.    Follow Up Recommendations  Outpatient PT     Equipment Recommendations  None recommended by PT    Recommendations for Other Services       Precautions / Restrictions Precautions Precautions: Fall Precaution Comments: L field cut vs inattention, poor safety    Mobility  Bed Mobility Overal bed mobility: Modified Independent Bed Mobility: Supine to Sit     Supine to sit: Modified independent (Device/Increase time);HOB elevated     General bed mobility comments: Incr time and effort  Transfers Overall transfer level: Needs assistance Equipment used: None Transfers: Sit to/from Stand Sit to Stand: Min guard         General transfer comment: Min guard for safety due to vision loss  Ambulation/Gait Ambulation/Gait assistance: Min guard Gait Distance (Feet): 250 Feet Assistive device: None Gait Pattern/deviations: Step-through pattern;Decreased stride length;Wide base of support Gait velocity: Decreased Gait velocity interpretation: 1.31 - 2.62 ft/sec, indicative of limited community ambulator General Gait Details: Assist for safety. No loss of balance   Stairs Stairs: Yes Stairs assistance: Min guard Stair Management: One rail Left;Alternating pattern;Step to pattern;Forwards Number of Stairs: 6 (6 x 1, 2 x 1) General stair comments: Encouraged pt to perform step to  but pt preferred alternating. Assist for safety   Wheelchair Mobility    Modified Rankin (Stroke Patients Only)       Balance Overall balance assessment: Needs assistance Sitting-balance support: No upper extremity supported Sitting balance-Leahy Scale: Good     Standing balance support: No upper extremity supported Standing balance-Leahy Scale: Fair                              Cognition Arousal/Alertness: Awake/alert Behavior During Therapy: WFL for tasks assessed/performed Overall Cognitive Status: Impaired/Different from baseline Area of Impairment: Safety/judgement;Awareness;Problem solving                         Safety/Judgement: Decreased awareness of safety;Decreased awareness of deficits Awareness: Emergent Problem Solving: Slow processing;Requires verbal cues        Exercises      General Comments        Pertinent Vitals/Pain      Home Living                      Prior Function            PT Goals (current goals can now be found in the care plan section) Acute Rehab PT Goals Patient Stated Goal: to go home Progress towards PT goals: Progressing toward goals    Frequency    Min 4X/week      PT Plan Current plan remains appropriate    Co-evaluation              AM-PAC PT "6 Clicks" Mobility  Outcome Measure  Help needed turning from your back to your side while in a flat bed without using bedrails?: None Help needed moving from lying on your back to sitting on the side of a flat bed without using bedrails?: None Help needed moving to and from a bed to a chair (including a wheelchair)?: A Little Help needed standing up from a chair using your arms (e.g., wheelchair or bedside chair)?: A Little Help needed to walk in hospital room?: A Little Help needed climbing 3-5 steps with a railing? : A Little 6 Click Score: 20    End of Session Equipment Utilized During Treatment: Gait belt Activity  Tolerance: Patient tolerated treatment well Patient left: in bed;with call bell/phone within reach;with bed alarm set Nurse Communication: Mobility status PT Visit Diagnosis: Unsteadiness on feet (R26.81);Muscle weakness (generalized) (M62.81)     Time: 1247-1310 PT Time Calculation (min) (ACUTE ONLY): 23 min  Charges:  $Gait Training: 23-37 mins                     [facility] PT Acute Rehabilitation Services Pager [phone] Office [phone]    Angelina Ok [facility] 04/29/2020, 2:16 PM

## 2020-04-29 NOTE — TOC Transition Note (Signed)
Transition of Care [facility]) - CM/SW Discharge Note   Patient Details  Name: Travis Chavez MRN: [id number] Date of Birth: [birth year]  Transition of Care [facility] CM/SW Contact:  Leone Haven, RN Phone Number: 04/29/2020, 1:43 PM   Clinical Narrative:    Patient for dc today, NCM gave him the 30 day free coupon for brilinta, and informed him of his copay of 3.00.  NCM made referral to neuro outpatient physical therapy for patient for PT, OT and Speech.  NCM informed him that they will call him to set up apts.    Final next level of care: Home/Self Care Barriers to Discharge: No Barriers Identified   Patient Goals and CMS Choice     Choice offered to / list presented to : NA  Discharge Placement                       Discharge Plan and Services                  DME Agency: NA       HH Arranged: NA          Social Determinants of Health (SDOH) Interventions     Readmission Risk Interventions No flowsheet data found.

## 2020-05-05 ENCOUNTER — Other Ambulatory Visit: Payer: Self-pay | Admitting: *Deleted

## 2020-05-05 NOTE — Patient Outreach (Signed)
[facility]) Care Management  05/05/2020  Travis Chavez [birth year] [id number]   EMMI Referral   Referral Reason : No EMMI Red Flag Patient wife with call to [facility] care management office reports patient complaint of arm pain. Referral from CMA for outreach.  Insurance: Medicaid Non APL list   Past Medical Hx: Hospital Admission [facility] 11/21-11/23/21, for Right PCA territory ischemic infarction secondary to progressive intracranial atherosclerosis with right PCA occulusion, loss of vision left eye, left weakness.  Hx of hyperlipidemia, hypertension, history of CVA with residual right side weakness, CKD stag 2, GERD.   Outreach Attempt #1 Subjective  Referral received, for follow up on patient concern for complaint of arm pain as wife call to [facility] care management offices.   Placed call to patient wife contact number and patient contact number no answer, able to leave a HIPAA compliant voice mail message for return call.   Received return call from patient wife Travis Chavez ,  She reports patient has complained of sharp pain back of right neck down right shoulder all the way down to wrist elbow area. He  denies any swelling, no increase in weakness of right arm, states that he had this pain for about 3 weeks prior to stroke and followed up with Virgina Jock, FNP was prescribed  prednisone and muscle relaxer for discomfort but he never started medication and prednisone was discontinued at this discharge and he is hesitant to start muscle relaxer.  She reports that he still has discomfort with movement of right shoulder  but at present is comfortable resting on couch positioned for comfort. She denies patient having shortness of breath, chest discomfort, she reports that his blood pressure has been in the 13-140/80 range.  Wife discussed that she has contacted PCP office and patient provider will not be in until tomorrow and she plans to return call for follow up and arrange post  discharge visit. Wife reports that she will be at home with patient for the next several days.  5/21  Reviewed warning signs/symptoms of stroke, no increase in weakness of left side, no worsening in vision and action plan.  She reports patient tolerating mobility in the home, currently without use of walker or cane. She reports assisting patient with personal care shower since discharge and tolerating well. Patient is tolerating diet without difficulty.    Medications Wife reports assisting patient with medications and review of discharge instructions. She denies cost concerns at present, she had card provided at discharge for 30 days supply and review of record, patient copay refill will be $3. Discussed signs symptoms of bleeding to notify MD of .   Appointments Patient wife to call PCP office for post discharge visit and follow regarding shoulder pain.  Neurology follow up appointment 06/11/20 Outpatient rehab referral wife has contact number to call for appointment if no call received by 12/2.    Discussed [facility] care management services, patient/ wife is agreeable to follow up call in the next 3 business days.  Patient [facility] 24 nurse advice line and my contact number to outreach for new concerns or questions sooner.   Plan Will plan follow up call to patient/wife in the next 3 business days at agreed upon time frame.  Will send successful outreach letter Reinforced action plan for stroke warning symptoms, seeking 911.    Egbert Garibaldi, RN, BSN  [facility] Care Management,Care Management Coordinator  [phone]- Mobile [phone]- Toll Free Main Office

## 2020-05-07 ENCOUNTER — Telehealth: Payer: Self-pay | Admitting: Neurology

## 2020-05-07 ENCOUNTER — Other Ambulatory Visit: Payer: Self-pay | Admitting: *Deleted

## 2020-05-07 DIAGNOSIS — G8110 Spastic hemiplegia affecting unspecified side: Secondary | ICD-10-CM

## 2020-05-07 DIAGNOSIS — Z8679 Personal history of other diseases of the circulatory system: Secondary | ICD-10-CM

## 2020-05-07 DIAGNOSIS — G459 Transient cerebral ischemic attack, unspecified: Secondary | ICD-10-CM

## 2020-05-07 NOTE — Telephone Encounter (Signed)
  °  Egbert Garibaldi, RN, BSN  [facility] Care Management,Care Management Coordinator  [phone]- Mobile [phone]- Toll Free Main Office   Called From [place] and Needed Order's Put in For PT, OT and Speech . Spoke to Patient's wife and she would like for him to go to [facility] because its closer to there home. Patient's wife would like to get started ASAP because she on leave from work thanks [employer] .   Erika Dr. Pearlean Brownie gave verbal ok to place orders . Thanks Annabelle Harman

## 2020-05-07 NOTE — Patient Outreach (Signed)
[facility]) Care Management  05/07/2020  OCTAVIA MOTTOLA 20-Oct-1960 [id number]   Referral Reason : No EMMI Red Flag alert  Patient wife with call to [facility] care management office reports patient complaint of arm pain. Referral from CMA for outreach.  Insurance: Medicaid Non APL list   Past Medical Hx: Hospital Admission [facility] 11/21-11/23/21, for Right PCA territory ischemic infarction secondary to progressive intracranial atherosclerosis with right PCA occulusion, loss of vision left eye, left weakness.  Hx of hyperlipidemia, hypertension, history of CVA with residual right side weakness, CKD stag 2, GERD.   Outreach call #2 Subjective Unsuccessful outreach call to patient , no answer able to leave a HIPAA compliant message for return call.   Care Coordination call  Placed call to outpatient neuro rehab regarding patient referral for services, spoke with representative that states additional information needed to complete for all services  Requested. PT/OT and ST.  she states Diagnosis of stroke needs to listed .  Placed call to Neurology office to assist with matter, able to speak with Darreld Mclean to explain situation for assistance. She states that order needed to be updated to include Stroke as Diagnosis, she was able to do it as orders placed while inpatient.She suggested to reach out to inpatient that placed initial order.   1300  Received return call from Darreld Mclean, that she has spoken with patient wife, Dr. Pearlean Brownie has been able to order recommended outpatient therapies and has spoken with patient wife, requesting outpatient services in [place].   Placed return call to patient wife she reports patient is doing okay, she needs to gain some strength . She reports that he is tolerating mobility in home and they have been to [employer] to activate his phone, patient wearing mask for covid precautions.   Wife states patient has an appointment with PCP on Friday, she  states that his blood pressure has been 140/90 range,encouraged continued monitoring and keeping a log for provider follow up,  no signs symptoms of stroke warning symptoms when reviewed.  She discussed patient arrangements for outpatient therapy to begin and she is looking forward to that to help with his strength.  Patient still has some discomfort at his right shoulder but  it seems to be more related to the position he is in and will discuss with PCP at visit.   Wife denies any new concerns at this time, patient has PCP appointment 05/09/20  outpatient therapy referral and contact number for follow up, and Neurology appointment . Patient wife able to state stroke waring symptoms and action plan .  Discussed ongoing stroke EMMI calls and depending on responses he may receive additional calls. Has [facility] and RN care coordinator contact number if new concerns arise sooner.    Plan No ongoing care management needs identified at this time. Patient wife    Egbert Garibaldi, RN, BSN  [facility] Care Management,Care Management Coordinator  [phone]- Mobile [phone]- Toll Free Main Office

## 2020-05-08 NOTE — Addendum Note (Signed)
Addended by: Alexia Freestone R on: 05/08/2020 09:16 AM   Modules accepted: Orders

## 2020-05-08 NOTE — Telephone Encounter (Signed)
PT/OT/Speech orders placed.

## 2020-05-08 NOTE — Telephone Encounter (Signed)
Agree with plan to give verbal order for PT OT and speech

## 2020-05-08 NOTE — Telephone Encounter (Signed)
Noted orders Sent

## 2020-05-13 ENCOUNTER — Telehealth: Payer: Self-pay | Admitting: Cardiology

## 2020-05-13 NOTE — Telephone Encounter (Signed)
Patient has a BMP order from Dr. Anne Fu that was scheduled for tomorrow but Travis Chavez wife states that he had a BMP drawn last week at his PCP's office. They are going to be faxing those over to our office. She wants to make sure he does not need to get lab work done again this week.

## 2020-05-13 NOTE — Telephone Encounter (Signed)
See phone note from 04/10/2020 = pt had BMP with K+ 3.7 - was started on 20 mEq daily of potassium chloride and ordered to repeat in 1 month. Lab results in Epic from 04/29/2020 demonstrate K+ 3.5, BUN 15, Crea 1.20.  We will need results from PCP. Pt has f/u appt with [facility] 05/26/2020.  Dr Anne Fu will determine if pt needs further blood work at that time.

## 2020-05-14 ENCOUNTER — Ambulatory Visit: Payer: Medicaid Other

## 2020-05-14 ENCOUNTER — Other Ambulatory Visit: Payer: Medicaid Other

## 2020-05-14 ENCOUNTER — Ambulatory Visit: Payer: Medicaid Other | Admitting: Occupational Therapy

## 2020-05-20 ENCOUNTER — Other Ambulatory Visit: Payer: Self-pay

## 2020-05-20 ENCOUNTER — Ambulatory Visit: Payer: Medicaid Other

## 2020-05-20 ENCOUNTER — Ambulatory Visit: Payer: Medicaid Other | Attending: Neurology

## 2020-05-20 DIAGNOSIS — H543 Unqualified visual loss, both eyes: Secondary | ICD-10-CM | POA: Diagnosis present

## 2020-05-20 DIAGNOSIS — R269 Unspecified abnormalities of gait and mobility: Secondary | ICD-10-CM

## 2020-05-20 DIAGNOSIS — M6281 Muscle weakness (generalized): Secondary | ICD-10-CM | POA: Insufficient documentation

## 2020-05-20 DIAGNOSIS — R278 Other lack of coordination: Secondary | ICD-10-CM | POA: Insufficient documentation

## 2020-05-20 DIAGNOSIS — R2681 Unsteadiness on feet: Secondary | ICD-10-CM | POA: Diagnosis not present

## 2020-05-20 NOTE — Therapy (Signed)
[facility] [address] [place], [place], [address] Phone: [phone]   Fax:  [phone]  Physical Therapy Treatment  Patient Details  Name: Travis Chavez MRN: [id number] Date of Birth: 08/28/1960 Referring Provider (PT): Delia HeadyPramod Sethi   Encounter Date: 05/20/2020   PT End of Session - 05/20/20 1633    Visit Number 1    Number of Visits 17    Date for PT Re-Evaluation 07/15/20    PT Start Time 0410    PT Stop Time 0505    PT Time Calculation (min) 55 min    Equipment Utilized During Treatment Gait belt    Activity Tolerance Patient tolerated treatment well    Behavior [facility] for tasks assessed/performed           Past Medical History:  Diagnosis Date  . Hypertension   . Stroke [facility](HCC)     History reviewed. No pertinent surgical history.  There were no vitals filed for this visit.   Subjective Assessment - 05/20/20 1608    Subjective The patient presents with a history of TIA 02/03/20 and ischemic left PCA stroke 04/27/20.  The patient did have another CVA in 2012 with residual right sided weakness.  The patient reports that he is seeing a neurologist on January 5 and hopes to get his eyes looked at because he is not seeing at a far distance very well and it is impacting his ability to do activities.  Patient has complaints of numbness right UE and is seeing OT for an evaluation 05/22/20.  Patient reports he has been working very hard since his recent stroke and his leg strength is back to his baseline.    Patient is accompained by: Family member    How long can you sit comfortably? pt reports he can sit all day without problems    How long can you stand comfortably? 30 mins    How long can you walk comfortably? pt reports he can stand all day without problems    Patient Stated Goals improve overall function    Currently in Pain? Yes    Pain Score 4     Pain Location Rib cage    Pain Orientation  Left    Pain Descriptors / Indicators Sharp    Pain Onset In the past 7 days    Pain Frequency Intermittent              [facility] PT Assessment - 05/20/20 0001      Assessment   Medical Diagnosis left PCA stroke    Referring Provider (PT) Delia HeadyPramod Sethi    Onset Date/Surgical Date 04/27/20    Hand Dominance Left   was R handed, 2012 stroke impacted that side     Balance Screen   Has the patient fallen in the past 6 months No    Has the patient had a decrease in activity level because of a fear of falling?  Yes    Is the patient reluctant to leave their home because of a fear of falling?  --   due to visual disturbances     Home Environment   Living Environment Private residence    Living Arrangements Spouse/significant other    Type of Home House    Home Access Stairs to enter    Entrance Stairs-Number of Steps 7    Entrance Stairs-Rails Can reach both    Home Layout Two level      Prior  Function   Level of Independence Independent with basic ADLs   with exception of showering due to right UE weakness     Cognition   Overall Cognitive Status Within Functional Limits for tasks assessed    Attention Focused    Focused Attention Appears intact    Memory Appears intact    Awareness Appears intact    Problem Solving Appears intact      Coordination   Finger Nose Finger Test abnormal on right    Heel Shin Test wnl      Posture/Postural Control   Posture/Postural Control No significant limitations      Functional Gait  Assessment   Gait assessed  Yes    Gait Level Surface Walks 20 ft, slow speed, abnormal gait pattern, evidence for imbalance or deviates 10-15 in outside of the 12 in walkway width. Requires more than 7 sec to ambulate 20 ft.    Change in Gait Speed Able to change speed, demonstrates mild gait deviations, deviates 6-10 in outside of the 12 in walkway width, or no gait deviations, unable to achieve a major change in velocity, or uses a change in velocity, or  uses an assistive device.    Gait with Horizontal Head Turns Performs head turns smoothly with slight change in gait velocity (eg, minor disruption to smooth gait path), deviates 6-10 in outside 12 in walkway width, or uses an assistive device.    Gait with Vertical Head Turns Performs task with slight change in gait velocity (eg, minor disruption to smooth gait path), deviates 6 - 10 in outside 12 in walkway width or uses assistive device    Gait and Pivot Turn Pivot turns safely in greater than 3 sec and stops with no loss of balance, or pivot turns safely within 3 sec and stops with mild imbalance, requires small steps to catch balance.    Step Over Obstacle Is able to step over one shoe box (4.5 in total height) without changing gait speed. No evidence of imbalance.    Gait with Narrow Base of Support Ambulates less than 4 steps heel to toe or cannot perform without assistance.    Gait with Eyes Closed Walks 20 ft, slow speed, abnormal gait pattern, evidence for imbalance, deviates 10-15 in outside 12 in walkway width. Requires more than 9 sec to ambulate 20 ft.    Ambulating Backwards Walks 20 ft, slow speed, abnormal gait pattern, evidence for imbalance, deviates 10-15 in outside 12 in walkway width.    Steps Alternating feet, must use rail.    Total Score 15    FGA comment: no assistive device            POSTURE: Seated: equal WB on bilateral LEs  Standing: equal WB on bilateral LEs     AROM BLE:  WFL bilateral LEs     STRENGTH:  Graded on a 0-5 scale Muscle Group Left Right  Hip Flex 5/5 5/5  Hip Abd 5/5 5/5  Hip Add 5/5 5/5  Hip Ext 5/5 5/5  Hip IR/ER 5/5 5/5  Knee Flex 5/5 5/5  Knee Ext 5/5 5/5  Ankle DF 5/5 5/5  Ankle PF 5/5 5/5    SENSATION:  BUE :  BLE :    NEUROLOGICAL SCREEN: (2+ unless otherwise noted.) N=normal  Ab=abnormal   Level Dermatome R L  L2 Medial thigh/groin N N  L3 Lower thigh/med.knee N N  L4 Medial leg/lat thigh N N  L5 Lat. leg & dorsal  foot  N N  S1 post/lat foot/thigh/leg N N  S2 Post./med. thigh & leg N N    SOMATOSENSORY:  Any N & T in extremities or weakness: reports : numbness right hand and arm         Sensation           Intact      Diminished         Absent  Light touch BLEs                               COORDINATION:           Heel Shin Slide Test: WNL         Finger to nose: abnormal on right     SPECIAL TESTS: Cranial Nerves/Vision : Able to follow pen without loss of vision, per patient farther away objects are cloudy   FUNCTIONAL MOBILITY: STS: mild use of UE support on thighs to rise from a seated position   Stairs: Reciprocal stair navigation with use of single railing preferably with left UE   BALANCE:     Static Sitting Balance  Normal Able to maintain balance against maximal resistance  x  Good Able to maintain balance against moderate resistance    Good-/Fair+ Accepts minimal resistance   Fair Able to sit unsupported without balance loss and without UE support    Poor+ Able to maintain with Minimal assistance from individual or chair    Poor Unable to maintain balance-requires mod/max support from individual or chair          Static Standing Balance  Normal Able to maintain standing balance against maximal resistance  x  Good Able to maintain standing balance against moderate resistance    Good-/Fair+ Able to maintain standing balance against minimal resistance    Fair Able to stand unsupported without UE support and without LOB for 1-2 min   Fair- Requires Min A and UE support to maintain standing without loss of balance    Poor+ Requires mod A and UE support to maintain standing without loss of balance    Poor Requires max A and UE support to maintain standing balance without loss          Dynamic Sitting Balance  Normal Able to sit unsupported and weight shift across midline maximally  x  Good Able to sit unsupported and weight shift across midline moderately    Good-/Fair+  Able to sit unsupported and weight shift across midline minimally    Fair Minimal weight shifting ipsilateral/front, difficulty crossing midline   Fair- Reach to ipsilateral side and unable to weight shift    Poor + Able to sit unsupported with min A and reach to ipsilateral side, unable to weight shift    Poor Able to sit unsupported with mod A and reach ipsilateral/front-can't cross midline          Standing Dynamic Balance  Normal Stand independently unsupported, able to weight shift and cross midline maximally  x  Good Stand independently unsupported, able to weight shift and cross midline moderately    Good-/Fair+ Stand independently unsupported, able to weight shift across midline minimally    Fair Stand independently unsupported, weight shift, and reach ipsilaterally, loss of balance when crossing midline   Poor+ Able to stand with Min A and reach ipsilaterally, unable to weight shift    Poor Able to stand with Mod A and minimally reach ipsilaterally, unable to  cross midline.          GAIT: Patient ambulates without AD.  The patient ambulates with right LE in ER most notable at foot and shortened stride length.   OUTCOME MEASURES: TEST Outcome Interpretation  5 times sit<>stand 16.32 seconds <60 yo, >10 sec indicates increased risk for falls  10 meter walk test      1.5      M/s  <1.0 m/s indicates increased risk for falls; limited community ambulator  Functional Gait Assessment 15/30 Max score 30 Interpretation: 25-28 = low risk fall  19-24 = medium risk fall < 19 = high risk fall  FOTO 64% Predicted discharge score of  78%                           PT Education - 05/20/20 1618    Education Details posture, exercise, results of evaluation    Person(s) Educated Patient    Methods Explanation    Comprehension Verbalized understanding            PT Short Term Goals - 05/20/20 1631      PT SHORT TERM GOAL #1   Title Patient will be independent in  home exercise program to improve strength/mobility for better functional independence with ADLs.    Status New    Target Date 07/15/20             PT Long Term Goals - 05/20/20 1631      PT LONG TERM GOAL #1   Title Patient will increase FOTO score to equal to or greater than 78% to demonstrate statistically significant improvement in mobility and quality of life.    Baseline 64%    Status New    Target Date 07/15/20      PT LONG TERM GOAL #2   Title Patient (< 12 years old) will complete five times sit to stand test in < 10 seconds indicating an increased LE strength and improved balance.    Baseline 16.32 seconds    Status New    Target Date 07/15/20      PT LONG TERM GOAL #3   Title Patient will increase 10 meter walk test to >1.57m/s as to improve gait speed for better community ambulation and to reduce fall risk.    Baseline 1.5 m/s    Status New    Target Date 07/15/20      PT LONG TERM GOAL #4   Title Patient will increase Functional Gait Assessment score to >20/30 as to reduce fall risk and improve dynamic gait safety with community ambulation    Status New    Target Date 07/15/20                 Plan - 05/20/20 1634    Clinical Impression Statement The patient is a pleasant 59 year old male with a history of TIA 02/03/20 and ischemic left PCA stroke 04/27/20.  The patient presents with functional weakness and decreased balance limiting his tolerance and ability to perform daily activities.  The patient has residual deficits from a reported stroke in 2012 with difficulty lfiting his right leg up in standing and ER foot during standing and ambulation.  Patient's scores on outcome measures does put him at risk for falling.  The patient benefits from skilled PT services to address impairmens for improved function, decreased fall risk and improved quality of life.    Personal Factors and Comorbidities Comorbidity 3+  Examination-Activity Limitations  Bathing;Lift;Locomotion Level;Stand;Toileting;Carry;Stairs;Squat    Examination-Participation Restrictions Occupation;Driving;Community Activity    Stability/Clinical Decision Making Evolving/Moderate complexity    Clinical Decision Making Moderate    Rehab Potential Good    PT Frequency 2x / week    PT Duration 8 weeks    PT Treatment/Interventions ADLs/Self Care Home Management;Moist Heat;Gait training;Stair training;Functional mobility training;Therapeutic activities;Therapeutic exercise;Balance training;Neuromuscular re-education;Patient/family education;Passive range of motion;Visual/perceptual remediation/compensation    PT Next Visit Plan initiate balance and LE strengthening proram    PT Home Exercise Plan issue next visit    Consulted and Agree with Plan of Care Patient;Family member/caregiver           Patient will benefit from skilled therapeutic intervention in order to improve the following deficits and impairments:  Abnormal gait,Decreased balance,Decreased endurance,Difficulty walking,Impaired vision/preception,Decreased activity tolerance,Decreased coordination,Decreased strength,Impaired flexibility,Impaired UE functional use,Postural dysfunction,Pain,Impaired perceived functional ability,Decreased range of motion  Visit Diagnosis: Unsteadiness on feet  Abnormality of gait and mobility  Muscle weakness (generalized)     Problem List Patient Active Problem List   Diagnosis Date Noted  . Cerebral thrombosis with cerebral infarction 04/29/2020  . Change in vision 04/27/2020  . Essential hypertension 04/27/2020  . Hyperlipidemia 04/27/2020  . Obesity (BMI 30-39.9) 04/27/2020  . Pre-diabetes 02/03/2020  . Cerebral hemorrhage (HCC) 02/02/2020  . Anemia 02/02/2020  . Disorder of bursae of shoulder region 02/02/2020  . Supraventricular tachycardia (HCC) 02/02/2020  . TIA (transient ischemic attack) 02/02/2020  . History of revision of total replacement of left hip  joint 11/30/2018  . Osteoarthritis of left hip 11/21/2018    Colin Mulders PT, DPT 05/20/2020, 5:29 PM  [facility] MAIN [facility] SERVICES [address] [place], [place], [address] Phone: [phone]   Fax:  [phone]  Name: Travis Chavez MRN: [id number] Date of Birth: 07-20-1960

## 2020-05-22 ENCOUNTER — Encounter: Payer: Medicaid Other | Admitting: Occupational Therapy

## 2020-05-22 ENCOUNTER — Other Ambulatory Visit: Payer: Self-pay

## 2020-05-22 ENCOUNTER — Ambulatory Visit: Payer: Medicaid Other | Admitting: Occupational Therapy

## 2020-05-22 ENCOUNTER — Ambulatory Visit: Payer: Medicaid Other

## 2020-05-22 DIAGNOSIS — M6281 Muscle weakness (generalized): Secondary | ICD-10-CM

## 2020-05-22 DIAGNOSIS — R2681 Unsteadiness on feet: Secondary | ICD-10-CM | POA: Diagnosis not present

## 2020-05-22 DIAGNOSIS — R278 Other lack of coordination: Secondary | ICD-10-CM

## 2020-05-22 DIAGNOSIS — H543 Unqualified visual loss, both eyes: Secondary | ICD-10-CM

## 2020-05-22 NOTE — Telephone Encounter (Signed)
Received lab results [facility]. 05/08/2020 lab demonstrates K+ 4.0, BUN 16, Creat 1.20, NA+ 144.  No changes needed in medications at this time.

## 2020-05-22 NOTE — Therapy (Signed)
[facility] MAIN [facility] SERVICES [address] [place], [place], [address] Phone: [phone]   Fax:  [phone]  Occupational Therapy Evaluation  Patient Details  Name: Travis Chavez MRN: [id number] Date of Birth: 1961/01/01 No data recorded  Encounter Date: 05/22/2020   OT End of Session - 05/22/20 1437    Visit Number 1    Number of Visits 24    Date for OT Re-Evaluation 08/14/20    Authorization Type progress report period  starting 05/22/2020    OT Start Time 1030    OT Stop Time 1130    OT Time Calculation (min) 60 min    Activity Tolerance Patient tolerated treatment well    Behavior During Therapy [facility] for tasks assessed/performed           Past Medical History:  Diagnosis Date  . Hypertension   . Stroke [facility])     No past surgical history on file.  There were no vitals filed for this visit.   Subjective Assessment - 05/22/20 1434    Subjective  Pt. was present with his wife.    Pertinent History Pt. is a 60 y.o. male who was admitted to [facility] with a TIA in 01/2020. Pt. was the admitted to [facility] on 04/27/2020. Pt.  was diagnosed with an acute infarct in the Right PCA territory, subacute left corpus collosum, and chronic infarcts in the left corona radiata, basal ganglia, and RIght cerrebellum. PMHx includes: HLD, HTN, GERD, Arrythmia, right CVA.    Currently in Pain? Yes    Pain Score 2     Pain Location Shoulder    Pain Orientation Right    Pain Descriptors / Indicators Clance Boll OT Assessment - 05/22/20 0001      Assessment   Medical Diagnosis CVA    Onset Date/Surgical Date 04/27/20    Hand Dominance Left   Right handed prior to previous stroke in 2012     Precautions   Precautions --   No driving     Restrictions   Weight Bearing Restrictions No      Balance Screen   Has the patient fallen in the past 6 months No    Has the patient had a decrease in activity level  because of a fear of falling?  No    Is the patient reluctant to leave their home because of a fear of falling?  No      Home  Environment   Family/patient expects to be discharged to: Private residence    Living Arrangements Spouse/significant other    Type of Home House    Home Access Stairs    Home Layout Two level    Alternate Level Stairs - Number of Steps 8 steps to enter    Bathroom Shower/Tub Tub/Shower unit    Shower/tub characteristics Curtain    Nurse, learning disability Yes    Home Equipment Shower seat;Hand held shower head;Grab bars - tub/shower;Bedside commode    Lives With Spouse      Prior Function   Level of Independence Independent    Vocation On disability    Vocation Requirements Truck driver    Leisure Working around the house      ADL   Eating/Feeding Independent   Unable to cut food   Grooming --   Independent with breushing teeth   Upper Body Bathing Minimal assistance    Lower  Body Bathing Moderate assistance    Upper Body Dressing --   Independent t shirt. Assist with buttoning   Lower Body Dressing Maximal assistance    Toilet Transfer Independent    Toileting - Solicitor -  Database administrator Independent      IADL   Prior Level of Function Shopping Independent    Shopping Needs to be accompanied on any shopping trip    Prior Level of Function Light Housekeeping Independent    Light Housekeeping Needs help with all home maintenance tasks    Prior Level of Function Meal Prep Independent    Meal Prep Able to complete simple cold meal and snack prep    Prior Level of Function Best boy Relies on family or friends for transportation    Prior Level of Function Medication Managment Independent    Medication Management Is not capable of dispensing or managing own medication    Prior Level of Function Financial Management Independent    Financial  Management Requires assistance      Written Expression   Dominant Hand Left    Handwriting 75% legible      Vision Assessment   Visual Fields Left visual field deficit      Cognition   Overall Cognitive Status Within Functional Limits for tasks assessed      Coordination   Gross Motor Movements are Fluid and Coordinated No    Fine Motor Movements are Fluid and Coordinated No    Right 9 Hole Peg Test Pt. was able to place 2 pegs. The first with the assist of the left hand. The second with the right.    Left 9 Hole Peg Test 42 sec.      Strength   Overall Strength Comments BUE strength 4-/5 overall      Hand Function   Right Hand Grip (lbs) 30    Right Hand Lateral Pinch 12 lbs    Right Hand 3 Point Pinch --   Unable to position the right hand   Left Hand Grip (lbs) 100    Left Hand Lateral Pinch 21 lbs    Left 3 point pinch 20 lbs                                OT Long Term Goals - 05/22/20 1456      OT LONG TERM GOAL #1   Title Pt. will improvie LUE strength by 2 mm grades to improve ADL, and IADL functioning.    Baseline Eval: Pt. presents with decreased LUE strength limiting ADL functioning.    Time 12    Period Weeks    Status New    Target Date 08/14/20      OT LONG TERM GOAL #2   Title Pt. will demonstrate visual compensatory strategies 100% of the time for navigating through his environment.    Baseline Eval:  pt. requires assist to navigate through his environment safely    Time 12    Period Weeks    Status New    Target Date 08/14/20      OT LONG TERM GOAL #3   Title Pt. will demonstrate visual compensatory strategies 100% of the time to be able to complete tabletip top tasks independently    Baseline Eval: pt. requires assist    Time 12    Period  Weeks    Status New    Target Date 08/14/20      OT LONG TERM GOAL #4   Title Pt. will accurately probelm solve through safety awareness, and judgement questions for ADLs/IADLs with  100% accuracy.    Baseline Eval: TBD    Time 12    Period Weeks    Status New    Target Date 08/14/20      OT LONG TERM GOAL #5   Title Pt. will improve FOTO score by 2 points for improved functional outcomes.    Baseline Eval: FOTO score: 72 (80 typical result)    Time 12    Period Weeks    Status New    Target Date 08/14/20                 Plan - 05/22/20 1439    Clinical Impression Statement Pt. is a 59 y.o. male who was diagnosed with an acute PCA territory infarct, and subactute left corpus collosum chronic infarct. At the onset of the most recent CVA, Pt. had left sided tingling and left sided visual field deficits. Pt. presents with residual right sided weakness from a prior CVA in 2012. Pt. presents wtih generalized weakness in the LUE, impaired RUE strength with decreased motor control [facility] skills, and impaired vision which affects his ability to safely perform ADLs, and IADL tasks. Pt.'s wife is currently on FML to assist the pt. Pt. reports that his vison clears some when he rubs the knot on the back of his neck. Pt. will benefit from OT skilled services for education about visual compensatory strategies to assist with ADLs, IADL functioning, and navigating in his environment, as well as within new environments.    OT Occupational Profile and History Problem Focused Assessment - Including review of records relating to presenting problem    Occupational performance deficits (Please refer to evaluation for details): ADL's;IADL's;Education    Rehab Potential Excellent    Clinical Decision Making Several treatment options, min-mod task modification necessary    Comorbidities Affecting Occupational Performance: May have comorbidities impacting occupational performance    Modification or Assistance to Complete Evaluation  Min-Moderate modification of tasks or assist with assess necessary to complete eval    OT Frequency 2x / week    OT Duration 12 weeks    OT  Treatment/Interventions Self-care/ADL training;Therapeutic exercise;Neuromuscular education;Therapeutic activities;Patient/family education;DME and/or AE instruction;Energy conservation    Consulted and Agree with Plan of Care Patient           Patient will benefit from skilled therapeutic intervention in order to improve the following deficits and impairments:           Visit Diagnosis: Muscle weakness (generalized)  Other lack of coordination  Low vision, both eyes    Problem List Patient Active Problem List   Diagnosis Date Noted  . Cerebral thrombosis with cerebral infarction 04/29/2020  . Change in vision 04/27/2020  . Essential hypertension 04/27/2020  . Hyperlipidemia 04/27/2020  . Obesity (BMI 30-39.9) 04/27/2020  . Pre-diabetes 02/03/2020  . Cerebral hemorrhage (HCC) 02/02/2020  . Anemia 02/02/2020  . Disorder of bursae of shoulder region 02/02/2020  . Supraventricular tachycardia (HCC) 02/02/2020  . TIA (transient ischemic attack) 02/02/2020  . 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 Osteoarthritis of left hip 11/21/2018    Olegario Messier, MS, OTR/L 05/22/2020, 3:11 PM  [facility] MAIN [facility] SERVICES [address] [place], [place],  [id number] Phone: [phone]   Fax:  [phone]  Name: Travis Chavez MRN: [id number] Date of Birth: 03/18/1961

## 2020-05-26 ENCOUNTER — Encounter: Payer: Self-pay | Admitting: Cardiology

## 2020-05-26 ENCOUNTER — Other Ambulatory Visit: Payer: Self-pay

## 2020-05-26 ENCOUNTER — Ambulatory Visit (INDEPENDENT_AMBULATORY_CARE_PROVIDER_SITE_OTHER): Payer: Medicaid Other | Admitting: Cardiology

## 2020-05-26 VITALS — BP 114/70 | HR 86 | Ht 65.0 in | Wt 231.0 lb

## 2020-05-26 DIAGNOSIS — E782 Mixed hyperlipidemia: Secondary | ICD-10-CM

## 2020-05-26 DIAGNOSIS — I6330 Cerebral infarction due to thrombosis of unspecified cerebral artery: Secondary | ICD-10-CM

## 2020-05-26 NOTE — Progress Notes (Signed)
Cardiology Office Note:    Date:  05/26/2020   ID:  RUSHTON EARLY, DOB 03-04-1961, MRN [id number]  PCP:  Fanny Dance, FNP  [facility] Cardiologist:  Donato Schultz, MD  [facility] Electrophysiologist:  None   Referring MD: Fanny Dance, FNP     History of Present Illness:    Travis Chavez is a 59 y.o. male here for the follow-up of prior stroke, junctional beats, winky block phenomenon.  Echo normal EF.  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  Since her last visit was admitted again with stroke. Lost vision in his left eye left hand was numb felt nauseous. After about 3 hours or so he started to slowly regain some vision  Back in August 2021 had similar symptoms  Past Medical History:  Diagnosis Date  . Hypertension   . Stroke [facility])     No past surgical history on file.  Current Medications: Current Meds  Medication Sig  . amLODipine (NORVASC) 5 MG tablet Take 5 mg by mouth 2 (two) times daily.  Marland Kitchen aspirin EC 81 MG EC tablet Take 1 tablet (81 mg total) by mouth daily. Swallow whole.  Marland Kitchen atorvastatin (LIPITOR) 80 MG tablet Take 1 tablet (80 mg total) by mouth daily.  . furosemide (LASIX) 20 MG tablet Take 1 tablet (20 mg total) by mouth daily.  Marland Kitchen gabapentin (NEURONTIN) 100 MG capsule Take 100 mg by mouth 3 (three) times daily.  . hydrALAZINE (APRESOLINE) 50 MG tablet Take 1 tablet (50 mg total) by mouth every 8 (eight) hours.  . hydrochlorothiazide (HYDRODIURIL) 25 MG tablet Take 1 tablet (25 mg total) by mouth daily.  . potassium chloride SA (KLOR-CON) 20 MEQ tablet Take 1 tablet (20 mEq total) by mouth daily.  Marland Kitchen terazosin (HYTRIN) 2 MG capsule Take 2 mg by mouth 2 (two) times daily.  . ticagrelor (BRILINTA) 90 MG TABS tablet Take 1 tablet (90 mg total) by mouth 2 (two) times daily.  .  vitamin B-12 (CYANOCOBALAMIN) 1000 MCG tablet Take 1,000 mcg by mouth daily.     Allergies:   Beta adrenergic blockers   Social History   Socioeconomic History  . Marital status: Married    Spouse name: Not on file  . Number of children: Not on file  . Years of education: Not on file  . Highest education level: Not on file  Occupational History  . Not on file  Tobacco Use  . Smoking status: Never Smoker  . Smokeless tobacco: Never Used  Substance and Sexual Activity  . Alcohol use: No    Alcohol/week: 0.0 standard drinks  . Drug use: No  . Sexual activity: Not on file  Other Topics Concern  . Not on file  Social History Narrative  . Not on file   Social Determinants of Health   Financial Resource Strain: Not on file  Food Insecurity: Not on file  Transportation Needs: Not on file  Physical Activity: Not on file  Stress: Not on file  Social Connections: Not on file     Family History:  ROS:   Please see the history of present illness.     All other systems reviewed and are negative.  EKGs/Labs/Other Studies Reviewed:    The following studies were reviewed today: ECHO 04/27/2020 1. Left ventricular ejection fraction, by estimation, is 60 to 65%.  The  left ventricle has normal function. There is mild left ventricular  hypertrophy. Left ventricular diastolic parameters are consistent with  Grade I diastolic dysfunction (impaired  relaxation).  2. The mitral valve is normal in structure. Trivial mitral valve  regurgitation.  3. The aortic valve is normal in structure.  4. The inferior vena cava is normal in size with greater than 50%  respiratory variability, suggesting right atrial pressure of 3 mmHg.   Conclusion(s)/Recommendation(s): No intracardiac source of embolism  detected on this transthoracic study. A transesophageal echocardiogram is  recommended to exclude cardiac source of embolism if clinically indicated.   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  Recent Labs: 02/03/2020: Magnesium 1.8 04/28/2020: ALT 16; Hemoglobin 13.7; Platelets 276 04/29/2020: BUN 15; Creatinine, Ser 1.20; Potassium 3.5; Sodium 139  Recent Lipid Panel    Component Value Date/Time   CHOL 95 04/27/2020 0746   TRIG 72 04/27/2020 0746   HDL 37 (L) 04/27/2020 0746   CHOLHDL 2.6 04/27/2020 0746   VLDL 14 04/27/2020 0746   LDLCALC 44 04/27/2020 0746     Risk Assessment/Calculations:       Physical Exam:    VS:  BP 114/70 (BP Location: Left Arm, Patient Position: Sitting, Cuff Size: Normal)   Pulse 86   Ht 5\' 5"  (1.651 m)   Wt 231 lb (104.8 kg)   SpO2 96%   BMI 38.44 kg/m     Wt Readings from Last 3 Encounters:  05/26/20 231 lb (104.8 kg)  04/29/20 226 lb 8 oz (102.7 kg)  04/08/20 233 lb (105.7 kg)     GEN:  Well nourished, well developed in no acute distress HEENT: Bilateral decreased vision NECK: No JVD; No carotid bruits LYMPHATICS: No lymphadenopathy CARDIAC: RRR, no murmurs, rubs, gallops RESPIRATORY:  Clear to auscultation without rales, wheezing or rhonchi  ABDOMEN: Soft, non-tender, non-distended MUSCULOSKELETAL:  No edema; No deformity  SKIN: Warm and dry NEUROLOGIC:  Alert and oriented x 3 PSYCHIATRIC:  Normal affect   ASSESSMENT:    1. Cerebral thrombosis with cerebral infarction   2. Mixed hyperlipidemia    PLAN:    In order of problems listed above:  Occipital stroke, acute right PCA territory ischemic infarct intracranial atherosclerosis with right PCA occlusion -Bilateral eye vision weakness -Started on Brilinta with the aspirin. -Echocardiogram reviewed from hospitalization in late November 2021 that showed no evidence of embolic source on transthoracic. He did not have a TEE done at that time. -If neurology feels as though he needs to have one we will be happy to help facilitate.  Recommended Brilinta and aspirin together for 3 months and then Brilinta alone.  Hyperlipidemia -Continue with high intensity  atorvastatin 80 mg. LDL 44. Excellent. Goal is less than 70. No myalgias. Continue with current medical management.  Essential hypertension -Excellent control today. Continue with current medication management. Medications reviewed as above.      Medication Adjustments/Labs and Tests Ordered: Current medicines are reviewed at length with the patient today.  Concerns regarding medicines are outlined above.  No orders of the defined types were placed in this encounter.  No orders of the defined types were placed in this encounter.   Patient Instructions  Medication Instructions:  The current medical regimen is effective;  continue present plan and medications.  *If you need a refill on your cardiac medications before your next appointment, please call your pharmacy*  Follow-Up: At [facility], you and your health needs are our priority.  As part of  our continuing mission to provide you with exceptional heart care, we have created designated Provider Care Teams.  These Care Teams include your primary Cardiologist (physician) and Advanced Practice Providers (APPs -  Physician Assistants and Nurse Practitioners) who all work together to provide you with the care you need, when you need it.  We recommend signing up for the patient portal called "MyChart".  Sign up information is provided on this After Visit Summary.  MyChart is used to connect with patients for Virtual Visits (Telemedicine).  Patients are able to view lab/test results, encounter notes, upcoming appointments, etc.  Non-urgent messages can be sent to your provider as well.   To learn more about what you can do with MyChart, go to [email or web address].    Your next appointment:   6 month(s)  The format for your next appointment:   In Person  Provider:   Donato Schultz, MD  Thank you for choosing [facility]!!         Signed, Donato Schultz, MD  05/26/2020 11:11 AM    [facility]  [facility]

## 2020-05-26 NOTE — Patient Instructions (Signed)

## 2020-05-27 ENCOUNTER — Ambulatory Visit: Payer: Medicaid Other | Admitting: Occupational Therapy

## 2020-05-27 ENCOUNTER — Ambulatory Visit: Payer: Medicaid Other

## 2020-06-02 ENCOUNTER — Encounter: Payer: Self-pay | Admitting: Occupational Therapy

## 2020-06-02 ENCOUNTER — Ambulatory Visit: Payer: Medicaid Other | Admitting: Occupational Therapy

## 2020-06-02 ENCOUNTER — Other Ambulatory Visit: Payer: Self-pay

## 2020-06-02 ENCOUNTER — Ambulatory Visit: Payer: Medicaid Other

## 2020-06-02 DIAGNOSIS — M6281 Muscle weakness (generalized): Secondary | ICD-10-CM

## 2020-06-02 DIAGNOSIS — R2681 Unsteadiness on feet: Secondary | ICD-10-CM

## 2020-06-02 DIAGNOSIS — R278 Other lack of coordination: Secondary | ICD-10-CM

## 2020-06-02 DIAGNOSIS — R269 Unspecified abnormalities of gait and mobility: Secondary | ICD-10-CM

## 2020-06-02 NOTE — Therapy (Signed)
[facility] MAIN [facility] SERVICES [address] [place], [place], [address] Phone: [phone]   Fax:  [phone]  Occupational Therapy Treatment  Patient Details  Name: Travis Chavez MRN: [id number] Date of Birth: [birth year] No data recorded  Encounter Date: 06/02/2020   OT End of Session - 06/02/20 1440    Visit Number 2    Number of Visits 24    Date for OT Re-Evaluation 08/14/20    Authorization Type progress report period  starting 05/22/2020    OT Start Time 1435    OT Stop Time 1515    OT Time Calculation (min) 40 min    Activity Tolerance Patient tolerated treatment well    Behavior During Therapy [facility] for tasks assessed/performed           Past Medical History:  Diagnosis Date  . Hypertension   . Stroke [facility])     History reviewed. No pertinent surgical history.  There were no vitals filed for this visit.   Subjective Assessment - 06/02/20 1439    Subjective  Pt. was present with his wife.    Pertinent History Pt. is a 59 y.o. male who was admitted to [facility] with a TIA in 01/2020. Pt. was the admitted to [facility] on 04/27/2020. Pt.  was diagnosed with an acute infarct in the Right PCA territory, subacute left corpus collosum, and chronic infarcts in the left corona radiata, basal ganglia, and RIght cerrebellum. PMHx includes: HLD, HTN, GERD, Arrythmia, right CVA.    Currently in Pain? No/denies          Therapeutic Activities:  Pt. performed visual scanning tasks completing a single letter search in 1 min. & 38 sec. Using vertical rectilinear visual search strategies. Pt. Had 6 omissions on the right side of the paper. Pt. Performed the single crowded letter search in 2 min. &  20 sec. Pt. Utilized horizontal rectilinear visual search strategies. Pt. Had 4 omissions. Pt. Completed word search in 2 min. & 49 sec. Using horizontal rectilinear visual search strategies. With no omissions. Pt. Completed the  structures structured circles search in 1 min. & 50 sec. With 2 omissions on the left, and 1 misidentification on the right. Pt. Pt. Completed the simple plain circles search in 1 min. & 8 sec. using a horizontal rectilinear search strategy initially, moving to a disorganized visual search strategy. Pt. Completed the Random plain circles search in . &  57 sec. Using a combination of vertical, and horizontal rectilinear visual search strategies with 3 omissions. Pt. Completed the random complex circles search in  . & 51  Sec. With  5 Omissions. Pt. Utilized a combination of horizontal, and vertical rectilinear visual search strategies.    Pt. Reports that he had to go to the ER last week secondary to having blood clots in his mouth. Pt. Reports that he was diagnosed with bronchitis. Pt. Required increased time to complete visual scanning tasks, and presented with multiple omissions especially on the right. Pt. Continues to work on improving visual scanning tasks, and providing pt./caregiver education about visual compensatory strategies for navigating through his environment, and managing tabletop ADL, and IADL tasks.                    OT Education - 06/02/20 1439    Education Details Visual scanning    Person(s) Educated Patient    Methods Explanation    Comprehension Verbalized understanding;Returned demonstration  OT Long Term Goals - 05/22/20 1456      OT LONG TERM GOAL #1   Title Pt. will improvie LUE strength by 2 mm grades to improve ADL, and IADL functioning.    Baseline Eval: Pt. presents with decreased LUE strength limiting ADL functioning.    Time 12    Period Weeks    Status New    Target Date 08/14/20      OT LONG TERM GOAL #2   Title Pt. will demonstrate visual compensatory strategies 100% of the time for navigating through his environment.    Baseline Eval:  pt. requires assist to navigate through his environment safely    Time 12     Period Weeks    Status New    Target Date 08/14/20      OT LONG TERM GOAL #3   Title Pt. will demonstrate visual compensatory strategies 100% of the time to be able to complete tabletip top tasks independently    Baseline Eval: pt. requires assist    Time 12    Period Weeks    Status New    Target Date 08/14/20      OT LONG TERM GOAL #4   Title Pt. will accurately probelm solve through safety awareness, and judgement questions for ADLs/IADLs with 100% accuracy.    Baseline Eval: TBD    Time 12    Period Weeks    Status New    Target Date 08/14/20      OT LONG TERM GOAL #5   Title Pt. will improve FOTO score by 2 points for improved functional outcomes.    Baseline Eval: FOTO score: 72 (80 typical result)    Time 12    Period Weeks    Status New    Target Date 08/14/20                 Plan - 06/02/20 1440    Clinical Impression Statement Pt. Reports that he had to go to the ER last week secondary to having blood clots in his mouth. Pt. Reports that he was diagnosed with bronchitis. Pt. Required increased time to complete visual scanning tasks, and presented with multiple omissions especially on the right. Pt. Continues to work on improving visual scanning tasks, and providing pt./caregiver education about visual compensatory strategies for navigating through his environment, and managing tabletop ADL, and IADL tasks.    OT Occupational Profile and History Problem Focused Assessment - Including review of records relating to presenting problem    Occupational performance deficits (Please refer to evaluation for details): ADL's;IADL's;Education    Rehab Potential Excellent    Clinical Decision Making Several treatment options, min-mod task modification necessary    Comorbidities Affecting Occupational Performance: May have comorbidities impacting occupational performance    Modification or Assistance to Complete Evaluation  Min-Moderate modification of tasks or assist with  assess necessary to complete eval    OT Frequency 2x / week    OT Duration 12 weeks    OT Treatment/Interventions Self-care/ADL training;Therapeutic exercise;Neuromuscular education;Therapeutic activities;Patient/family education;DME and/or AE instruction;Energy conservation    Consulted and Agree with Plan of Care Patient           Patient will benefit from skilled therapeutic intervention in order to improve the following deficits and impairments:           Visit Diagnosis: Muscle weakness (generalized)  Other lack of coordination    Problem List Patient Active Problem List   Diagnosis Date Noted  .  Cerebral thrombosis with cerebral infarction 04/29/2020  . Change in vision 04/27/2020  . Essential hypertension 04/27/2020  . Hyperlipidemia 04/27/2020  . Obesity (BMI 30-39.9) 04/27/2020  . Pre-diabetes 02/03/2020  . Cerebral hemorrhage (HCC) 02/02/2020  . Anemia 02/02/2020  . Disorder of bursae of shoulder region 02/02/2020  . Supraventricular tachycardia (HCC) 02/02/2020  . TIA (transient ischemic attack) 02/02/2020  . 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 Osteoarthritis of left hip 11/21/2018    Olegario Messier, MS, OTR/L 06/02/2020, 2:41 PM  [facility] MAIN [facility] SERVICES [address] [place], [place], [address] Phone: [phone]   Fax:  [phone]  Name: Travis Chavez MRN: [id number] Date of Birth: 1960-11-16

## 2020-06-02 NOTE — Therapy (Signed)
[facility] MAIN [facility] SERVICES [address] [place], [place], [address] Phone: [phone]   Fax:  [phone]  Physical Therapy Treatment  Patient Details  Name: Travis Chavez MRN: [id number] Date of Birth: 1961-05-24 Referring Provider (PT): Delia Heady   Encounter Date: 06/02/2020   PT End of Session - 06/02/20 1553    Visit Number 2    Number of Visits 17    Date for PT Re-Evaluation 07/15/20    PT Start Time 1345    PT Stop Time 1430    PT Time Calculation (min) 45 min    Equipment Utilized During Treatment Gait belt    Activity Tolerance Patient tolerated treatment well    Behavior During Therapy [facility] for tasks assessed/performed           Past Medical History:  Diagnosis Date  . Hypertension   . Stroke [facility])     No past surgical history on file.  There were no vitals filed for this visit.   Subjective Assessment - 06/02/20 1348    Subjective Pt reports he is having R hand tingling.  He feels like his leg strength is doing pretty well.  He reports he has not fallen or had any major losses of balance since PT initial evaluation.    Patient is accompained by: Family member    How long can you sit comfortably? pt reports he can sit all day without problems    How long can you stand comfortably? 30 mins    How long can you walk comfortably? pt reports he can stand all day without problems    Patient Stated Goals improve overall function    Currently in Pain? No/denies           Treatment Today:  Therex: -Sit to stand 2x5 (27 sec today) -front step up RLLR x20 b/l UE support -stair asecent (4 steps) with single rail (R side while ascending, L while descending to simulate his railing at home), practiced step-to pattern for safety today leading up with L, down with R 4x -stair ascent and descent with bilateral railing and reciprocal gait 3x -amb around obstacles in clinic on flat surfaces, PT SBA with pt wearing gait  belt, 3 laps around PT gym  Neuro re-ed: -tandem stance R/L with single UE support 3x 20 sec, focus on upright posture -feet together EO 3x 20 sec, no UE support -airex feet together EO 30 esc -airex holding ball (verbal/visual cues for R hand/palm placement on ball): overhead lift x 15, pass bal to/from PT to L x 15, to R x15       PT Education - 06/02/20 1553    Education Details exercies technique    Person(s) Educated Patient;Spouse    Methods Explanation;Demonstration;Tactile cues;Verbal cues    Comprehension Verbal cues required;Returned demonstration;Verbalized understanding;Tactile cues required            PT Short Term Goals - 06/02/20 1603      PT SHORT TERM GOAL #1   Title Patient will be independent in home exercise program to improve strength/mobility for better functional independence with ADLs.    Status New    Target Date 07/15/20             PT Long Term Goals - 06/02/20 1603      PT LONG TERM GOAL #1   Title Patient will increase FOTO score to equal to or greater than 78% to demonstrate statistically significant improvement in mobility  and quality of life.    Baseline 64%    Status New      PT LONG TERM GOAL #2   Title Patient (< 59 years old) will complete five times sit to stand test in < 10 seconds indicating an increased LE strength and improved balance.    Baseline 16.32 seconds    Status New      PT LONG TERM GOAL #3   Title Patient will increase 10 meter walk test to >1.55m/s as to improve gait speed for better community ambulation and to reduce fall risk.    Baseline 1.5 m/s    Status New      PT LONG TERM GOAL #4   Title Patient will increase Functional Gait Assessment score to >20/30 as to reduce fall risk and improve dynamic gait safety with community ambulation    Status New                 Plan - 06/02/20 1556    Clinical Impression Statement Pt was motivated to participate in today's PT session, he required intermittent  rest breaks as he fatigued with standing exercises.  Dual tasking on airex was most fatiguing for him.  Decreased R LE eccentric functional strength observed during stair descent.  Overall, he should continue to do well with skilled PT services to address impairments for improved function, decreased fall risk, and improved quality of life.    Personal Factors and Comorbidities Comorbidity 3+    Examination-Activity Limitations Bathing;Lift;Locomotion Level;Stand;Toileting;Carry;Stairs;Squat    Examination-Participation Restrictions Occupation;Driving;Community Activity    Stability/Clinical Decision Making Evolving/Moderate complexity    Rehab Potential Good    PT Frequency 2x / week    PT Duration 8 weeks    PT Treatment/Interventions ADLs/Self Care Home Management;Moist Heat;Gait training;Stair training;Functional mobility training;Therapeutic activities;Therapeutic exercise;Balance training;Neuromuscular re-education;Patient/family education;Passive range of motion;Visual/perceptual remediation/compensation    PT Next Visit Plan initiate balance and LE strengthening proram    PT Home Exercise Plan issue next visit    Consulted and Agree with Plan of Care Patient;Family member/caregiver           Patient will benefit from skilled therapeutic intervention in order to improve the following deficits and impairments:  Abnormal gait,Decreased balance,Decreased endurance,Difficulty walking,Impaired vision/preception,Decreased activity tolerance,Decreased coordination,Decreased strength,Impaired flexibility,Impaired UE functional use,Postural dysfunction,Pain,Impaired perceived functional ability,Decreased range of motion  Visit Diagnosis: Muscle weakness (generalized)  Unsteadiness on feet  Abnormality of gait and mobility     Problem List Patient Active Problem List   Diagnosis Date Noted  . Cerebral thrombosis with cerebral infarction 04/29/2020  . Change in vision 04/27/2020  .  Essential hypertension 04/27/2020  . Hyperlipidemia 04/27/2020  . Obesity (BMI 30-39.9) 04/27/2020  . Pre-diabetes 02/03/2020  . Cerebral hemorrhage (HCC) 02/02/2020  . Anemia 02/02/2020  . Disorder of bursae of shoulder region 02/02/2020  . Supraventricular tachycardia (HCC) 02/02/2020  . TIA (transient ischemic attack) 02/02/2020  . 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 Osteoarthritis of left hip 11/21/2018    Ardine Bjork 06/02/2020, 4:13 PM Max Fickle, PT, DPT Physical Therapist - [facility]  Outpatient Physical Therapy- Main Campus [phone]   [facility] MAIN [facility] SERVICES [address] [place], [place], [address] Phone: [phone]   Fax:  [phone]  Name: Travis Chavez MRN: [id number] Date of Birth: [birth year]

## 2020-06-04 ENCOUNTER — Other Ambulatory Visit: Payer: Self-pay

## 2020-06-04 ENCOUNTER — Ambulatory Visit: Payer: Medicaid Other | Admitting: Occupational Therapy

## 2020-06-04 ENCOUNTER — Encounter: Payer: Self-pay | Admitting: Occupational Therapy

## 2020-06-04 ENCOUNTER — Ambulatory Visit: Payer: Medicaid Other

## 2020-06-04 DIAGNOSIS — R2681 Unsteadiness on feet: Secondary | ICD-10-CM | POA: Diagnosis not present

## 2020-06-04 DIAGNOSIS — M6281 Muscle weakness (generalized): Secondary | ICD-10-CM

## 2020-06-04 DIAGNOSIS — R278 Other lack of coordination: Secondary | ICD-10-CM

## 2020-06-04 NOTE — Therapy (Signed)
[facility] [facility] MAIN [facility] SERVICES [address] [place], [place], [address] Phone: [phone]   Fax:  [phone]  Physical Therapy Treatment  Patient Details  Name: Travis Chavez MRN: [id number] Date of Birth: 09/23/1960 Referring Provider (PT): Delia Heady   Encounter Date: 06/04/2020   PT End of Session - 06/04/20 1018    Visit Number 3    Number of Visits 17    Date for PT Re-Evaluation 07/15/20    PT Start Time 1020    PT Stop Time 1100    PT Time Calculation (min) 40 min    Equipment Utilized During Treatment Gait belt    Activity Tolerance Patient tolerated treatment well    Behavior During Therapy [facility] for tasks assessed/performed           Past Medical History:  Diagnosis Date  . Hypertension   . Stroke [facility])     No past surgical history on file.  There were no vitals filed for this visit.   Subjective Assessment - 06/04/20 1014    Subjective Pt reports he is feeling a little tired upon arrival as he just finished OT prior to this PT session.    Patient is accompained by: Family member    How long can you sit comfortably? pt reports he can sit all day without problems    How long can you stand comfortably? 30 mins    How long can you walk comfortably? pt reports he can stand all day without problems    Patient Stated Goals improve overall function          Treatment Today:  Therex: -Sit to stand 3x5 (last set holding small green ball between hands) -overhead reach holding ball 2x10 -standing marches 2x10 (in // bars) -standing hamstring curls 2x10 (in // bars) -forward stepping over orange hurdles (in // bars) -side stepping in // bars 4 laps -amb 400 ft with PT CGA, pt initially able to clear R foot during swing phase, but beginning at 300 ft he began dragging foot    Neuro re-ed: -airex tandem stance R/L with single UE support 3x 20 sec, focus on upright posture -airex feet together EO 30 esc -airex  holding ball (verbal/visual cues for R hand/palm placement on ball): overhead lift x 15, pass ball to/from PT to L x 15, to R x15 -R SLS modified with L on 4 inch step, overhead reach holding small green ball x10, chest pass to PT x10, lateral pass to PT x10 -alternate foot taps to step x15 ea        PT Education - 06/04/20 1018    Education Details exercies technique    Person(s) Educated Patient    Methods Explanation;Demonstration;Tactile cues    Comprehension Verbalized understanding;Returned demonstration            PT Short Term Goals - 06/02/20 1603      PT SHORT TERM GOAL #1   Title Patient will be independent in home exercise program to improve strength/mobility for better functional independence with ADLs.    Status New    Target Date 07/15/20             PT Long Term Goals - 06/02/20 1603      PT LONG TERM GOAL #1   Title Patient will increase FOTO score to equal to or greater than 78% to demonstrate statistically significant improvement in mobility and quality of life.    Baseline 64%  Status New      PT LONG TERM GOAL #2   Title Patient (< 44 years old) will complete five times sit to stand test in < 10 seconds indicating an increased LE strength and improved balance.    Baseline 16.32 seconds    Status New      PT LONG TERM GOAL #3   Title Patient will increase 10 meter walk test to >1.71m/s as to improve gait speed for better community ambulation and to reduce fall risk.    Baseline 1.5 m/s    Status New      PT LONG TERM GOAL #4   Title Patient will increase Functional Gait Assessment score to >20/30 as to reduce fall risk and improve dynamic gait safety with community ambulation    Status New                 Plan - 06/04/20 1318    Clinical Impression Statement Pt tolerated PT session well today.  Impaired R LE proprioception/coordination noted with clearing R LE over hurdles.  Pt amb 400 ft with PT CGA and no AD; initially pt able to  fully clear R foot during amb, but decreased ability to clear R foot noted as he fatigued during last 100 ft.  Overall, he should continue to do well with skilled PT to improve strength, balance, gait, and mobility to improve function, decrease fall risk, and improve quality of life.    Personal Factors and Comorbidities Comorbidity 3+    Examination-Activity Limitations Bathing;Lift;Locomotion Level;Stand;Toileting;Carry;Stairs;Squat    Examination-Participation Restrictions Occupation;Driving;Community Activity    Stability/Clinical Decision Making Evolving/Moderate complexity    Rehab Potential Good    PT Frequency 2x / week    PT Duration 8 weeks    PT Treatment/Interventions ADLs/Self Care Home Management;Moist Heat;Gait training;Stair training;Functional mobility training;Therapeutic activities;Therapeutic exercise;Balance training;Neuromuscular re-education;Patient/family education;Passive range of motion;Visual/perceptual remediation/compensation    PT Next Visit Plan initiate balance and LE strengthening proram    PT Home Exercise Plan issue next visit    Consulted and Agree with Plan of Care Patient;Family member/caregiver           Patient will benefit from skilled therapeutic intervention in order to improve the following deficits and impairments:  Abnormal gait,Decreased balance,Decreased endurance,Difficulty walking,Impaired vision/preception,Decreased activity tolerance,Decreased coordination,Decreased strength,Impaired flexibility,Impaired UE functional use,Postural dysfunction,Pain,Impaired perceived functional ability,Decreased range of motion  Visit Diagnosis: Muscle weakness (generalized)  Other lack of coordination  Unsteadiness on feet     Problem List Patient Active Problem List   Diagnosis Date Noted  . Cerebral thrombosis with cerebral infarction 04/29/2020  . Change in vision 04/27/2020  . Essential hypertension 04/27/2020  . Hyperlipidemia 04/27/2020  .  Obesity (BMI 30-39.9) 04/27/2020  . Pre-diabetes 02/03/2020  . Cerebral hemorrhage (HCC) 02/02/2020  . Anemia 02/02/2020  . Disorder of bursae of shoulder region 02/02/2020  . Supraventricular tachycardia (HCC) 02/02/2020  . TIA (transient ischemic attack) 02/02/2020  . 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 Osteoarthritis of left hip 11/21/2018    Ardine Bjork 06/04/2020, 1:22 PM Max Fickle, PT, DPT Physical Therapist - [facility]  Outpatient Physical Therapy- Main Campus [phone]   [facility] MAIN [facility] SERVICES [address] [place], [place], [address] Phone: [phone]   Fax:  [phone]  Name: Travis Chavez MRN: [id number] Date of Birth: 23-Aug-1960

## 2020-06-04 NOTE — Therapy (Signed)
[facility] MAIN [facility] SERVICES [address] [place], [place], [address] Phone: [phone]   Fax:  [phone]  Occupational Therapy Treatment  Patient Details  Name: Travis Chavez MRN: [id number] Date of Birth: Mar 17, 1961 No data recorded  Encounter Date: 06/04/2020   OT End of Session - 06/04/20 0940    Visit Number 3    Number of Visits 24    Date for OT Re-Evaluation 08/14/20    Authorization Type progress report period  starting 05/22/2020    OT Start Time 0933    OT Stop Time 1015    OT Time Calculation (min) 42 min    Activity Tolerance Patient tolerated treatment well    Behavior During Therapy [facility] for tasks assessed/performed           Past Medical History:  Diagnosis Date  . Hypertension   . Stroke [facility])     History reviewed. No pertinent surgical history.  There were no vitals filed for this visit.   Subjective Assessment - 06/04/20 0939    Subjective  Pt. was present with his wife.    Pertinent History Pt. is a 59 y.o. male who was admitted to [facility] with a TIA in 01/2020. Pt. was the admitted to [facility] on 04/27/2020. Pt.  was diagnosed with an acute infarct in the Right PCA territory, subacute left corpus collosum, and chronic infarcts in the left corona radiata, basal ganglia, and RIght cerrebellum. PMHx includes: HLD, HTN, GERD, Arrythmia, right CVA.    Currently in Pain? No/denies          OT TREATMENT    Therapeutic Activities:  Pt. worked on visual scanning tasks using [employer] patterns. Pt. started with simple designs, and moved to one's of moderate complexity. Pt. required increased time to complete as well as verbal, and visual cues to complete each task.  Neuromuscular re-education:  Pt. worked on using his right hand to grasp the [employer] pieces, placing them into a container, and actively extending his digits with each release. Pt. required proprioceptive input  through the right hand during the task. Pt. Required restbreaks secondary to fatigue when using the right hand.  Pt. required verbal, and visual cues for distinguishing between green, and blue pieces. Pt. required verbal, and visual cues to correct the right side of the simple design pattern. Pt. required increased time to complete the design of moderate complexity, however did not require any cues on the right.  Pt. continues to work on improving visual scanning, and visual search strategies in preparation for navigating through his environment, and completing tabletop tasks.                         OT Education - 06/04/20 0940    Education Details Visual scanning    Person(s) Educated Patient    Comprehension Verbalized understanding;Returned demonstration               OT Long Term Goals - 05/22/20 1456      OT LONG TERM GOAL #1   Title Pt. will improvie LUE strength by 2 mm grades to improve ADL, and IADL functioning.    Baseline Eval: Pt. presents with decreased LUE strength limiting ADL functioning.    Time 12    Period Weeks    Status New    Target Date 08/14/20      OT LONG TERM GOAL #2   Title Pt.  will demonstrate visual compensatory strategies 100% of the time for navigating through his environment.    Baseline Eval:  pt. requires assist to navigate through his environment safely    Time 12    Period Weeks    Status New    Target Date 08/14/20      OT LONG TERM GOAL #3   Title Pt. will demonstrate visual compensatory strategies 100% of the time to be able to complete tabletip top tasks independently    Baseline Eval: pt. requires assist    Time 12    Period Weeks    Status New    Target Date 08/14/20      OT LONG TERM GOAL #4   Title Pt. will accurately probelm solve through safety awareness, and judgement questions for ADLs/IADLs with 100% accuracy.    Baseline Eval: TBD    Time 12    Period Weeks    Status New    Target Date 08/14/20       OT LONG TERM GOAL #5   Title Pt. will improve FOTO score by 2 points for improved functional outcomes.    Baseline Eval: FOTO score: 72 (80 typical result)    Time 12    Period Weeks    Status New    Target Date 08/14/20                 Plan - 06/04/20 0945    Clinical Impression Statement Pt. required verbal, and visual cues for distinguishing between green, and blue pieces. Pt. required verbal, and visual cues to correct the right side of the simple design pattern. Pt. required increased time to complete the design of moderate complexity, however did not require any cues on the right.  Pt. continues to work on improving visual scanning, and visual search strategies in preparation for navigating through his environment, and completing tabletop tasks.    OT Occupational Profile and History Problem Focused Assessment - Including review of records relating to presenting problem    Occupational performance deficits (Please refer to evaluation for details): ADL's;IADL's;Education    Rehab Potential Excellent    Clinical Decision Making Several treatment options, min-mod task modification necessary    Comorbidities Affecting Occupational Performance: May have comorbidities impacting occupational performance    Modification or Assistance to Complete Evaluation  Min-Moderate modification of tasks or assist with assess necessary to complete eval    OT Frequency 2x / week    OT Duration 12 weeks    OT Treatment/Interventions Self-care/ADL training;Therapeutic exercise;Neuromuscular education;Therapeutic activities;Patient/family education;DME and/or AE instruction;Energy conservation    Consulted and Agree with Plan of Care Patient           Patient will benefit from skilled therapeutic intervention in order to improve the following deficits and impairments:           Visit Diagnosis: Muscle weakness (generalized)    Problem List Patient Active Problem List   Diagnosis  Date Noted  . Cerebral thrombosis with cerebral infarction 04/29/2020  . Change in vision 04/27/2020  . Essential hypertension 04/27/2020  . Hyperlipidemia 04/27/2020  . Obesity (BMI 30-39.9) 04/27/2020  . Pre-diabetes 02/03/2020  . Cerebral hemorrhage (HCC) 02/02/2020  . Anemia 02/02/2020  . Disorder of bursae of shoulder region 02/02/2020  . Supraventricular tachycardia (HCC) 02/02/2020  . TIA (transient ischemic attack) 02/02/2020  . 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 Osteoarthritis of left hip 11/21/2018    Olegario Messier, MS, OTR/L  06/04/2020, 9:48 AM  [facility] MAIN [facility] SERVICES [address] [place], [place], [address] Phone: [phone]   Fax:  [phone]  Name: Travis Chavez MRN: [id number] Date of Birth: July 01, 1960

## 2020-06-09 ENCOUNTER — Ambulatory Visit: Payer: Medicaid Other

## 2020-06-09 ENCOUNTER — Ambulatory Visit: Payer: Medicaid Other | Admitting: Occupational Therapy

## 2020-06-11 ENCOUNTER — Ambulatory Visit: Payer: Medicaid Other | Admitting: Adult Health

## 2020-06-11 ENCOUNTER — Encounter: Payer: Self-pay | Admitting: Adult Health

## 2020-06-11 VITALS — BP 138/96 | HR 86 | Ht 65.0 in | Wt 230.6 lb

## 2020-06-11 DIAGNOSIS — G459 Transient cerebral ischemic attack, unspecified: Secondary | ICD-10-CM

## 2020-06-11 DIAGNOSIS — I63531 Cerebral infarction due to unspecified occlusion or stenosis of right posterior cerebral artery: Secondary | ICD-10-CM | POA: Diagnosis not present

## 2020-06-11 DIAGNOSIS — Z8679 Personal history of other diseases of the circulatory system: Secondary | ICD-10-CM | POA: Diagnosis not present

## 2020-06-11 DIAGNOSIS — I10 Essential (primary) hypertension: Secondary | ICD-10-CM

## 2020-06-11 DIAGNOSIS — I69398 Other sequelae of cerebral infarction: Secondary | ICD-10-CM | POA: Diagnosis not present

## 2020-06-11 DIAGNOSIS — E785 Hyperlipidemia, unspecified: Secondary | ICD-10-CM

## 2020-06-11 DIAGNOSIS — H547 Unspecified visual loss: Secondary | ICD-10-CM

## 2020-06-11 DIAGNOSIS — R200 Anesthesia of skin: Secondary | ICD-10-CM

## 2020-06-11 DIAGNOSIS — R7303 Prediabetes: Secondary | ICD-10-CM

## 2020-06-11 NOTE — Patient Instructions (Addendum)
Overall you are recovering very well! Ensure you continue to work with therapies for likely ongoing improvement Will reach out to your physical therapies for possible dry needling as well as use of moist heat, neck stretching and range of motion   Continue aspirin 81 mg daily and Brilinta (ticagrelor) 90 mg bid  and atorvastatin 80mg  daily  for secondary stroke prevention Continue aspirin and brilinta for total of 3 months and then stop aspirin and continue brilinta alone  Referral placed to eye doctor for clearance return to driving  Continue to follow up with PCP regarding cholesterol and blood pressure management  Maintain strict control of hypertension with blood pressure goal below 130/90 and cholesterol with LDL cholesterol (bad cholesterol) goal below 70 mg/dL.      Followup in the future with me in 3 months or call earlier if needed      Thank you for coming to see at [facility]. I hope we have been able to provide you high quality care today.  You may receive a patient satisfaction survey over the next few weeks. We would appreciate your feedback and comments so that we may continue to improve ourselves and the health of our patients.      Neck Exercises Ask your health care provider which exercises are safe for you. Do exercises exactly as told by your health care provider and adjust them as directed. It is normal to feel mild stretching, pulling, tightness, or discomfort as you do these exercises. Stop right away if you feel sudden pain or your pain gets worse. Do not begin these exercises until told by your health care provider. Neck exercises can be important for many reasons. They can improve strength and maintain flexibility in your neck, which will help your upper back and prevent neck pain. Stretching exercises Rotation neck stretching  1. Sit in a chair or stand up. 2. Place your feet flat on the floor, shoulder width apart. 3. Slowly turn  your head (rotate) to the right until a slight stretch is felt. Turn it all the way to the right so you can look over your right shoulder. Do not tilt or tip your head. 4. Hold this position for 10-30 seconds. 5. Slowly turn your head (rotate) to the left until a slight stretch is felt. Turn it all the way to the left so you can look over your left shoulder. Do not tilt or tip your head. 6. Hold this position for 10-30 seconds. Repeat __________ times. Complete this exercise __________ times a day. Neck retraction 1. Sit in a sturdy chair or stand up. 2. Look straight ahead. Do not bend your neck. 3. Use your fingers to push your chin backward (retraction). Do not bend your neck for this movement. Continue to face straight ahead. If you are doing the exercise properly, you will feel a slight sensation in your throat and a stretch at the back of your neck. 4. Hold the stretch for 1-2 seconds. Repeat __________ times. Complete this exercise __________ times a day. Strengthening exercises Neck press 1. Lie on your back on a firm bed or on the floor with a pillow under your head. 2. Use your neck muscles to push your head down on the pillow and straighten your spine. 3. Hold the position as well as you can. Keep your head facing up (in a neutral position) and your chin tucked. 4. Slowly count to 5 while holding this position. Repeat __________ times. Complete this exercise  __________ times a day. Isometrics These are exercises in which you strengthen the muscles in your neck while keeping your neck still (isometrics). 1. Sit in a supportive chair and place your hand on your forehead. 2. Keep your head and face facing straight ahead. Do not flex or extend your neck while doing isometrics. 3. Push forward with your head and neck while pushing back with your hand. Hold for 10 seconds. 4. Do the sequence again, this time putting your hand against the back of your head. Use your head and neck to push  backward against the hand pressure. 5. Finally, do the same exercise on either side of your head, pushing sideways against the pressure of your hand. Repeat __________ times. Complete this exercise __________ times a day. Prone head lifts 1. Lie face-down (prone position), resting on your elbows so that your chest and upper back are raised. 2. Start with your head facing downward, near your chest. Position your chin either on or near your chest. 3. Slowly lift your head upward. Lift until you are looking straight ahead. Then continue lifting your head as far back as you can comfortably stretch. 4. Hold your head up for 5 seconds. Then slowly lower it to your starting position. Repeat __________ times. Complete this exercise __________ times a day. Supine head lifts 1. Lie on your back (supine position), bending your knees to point to the ceiling and keeping your feet flat on the floor. 2. Lift your head slowly off the floor, raising your chin toward your chest. 3. Hold for 5 seconds. Repeat __________ times. Complete this exercise __________ times a day. Scapular retraction 1. Stand with your arms at your sides. Look straight ahead. 2. Slowly pull both shoulders (scapulae) backward and downward (retraction) until you feel a stretch between your shoulder blades in your upper back. 3. Hold for 10-30 seconds. 4. Relax and repeat. Repeat __________ times. Complete this exercise __________ times a day. Contact a health care provider if:  Your neck pain or discomfort gets much worse when you do an exercise.  Your neck pain or discomfort does not improve within 2 hours after you exercise. If you have any of these problems, stop exercising right away. Do not do the exercises again unless your health care provider says that you can. Get help right away if:  You develop sudden, severe neck pain. If this happens, stop exercising right away. Do not do the exercises again unless your health care  provider says that you can. This information is not intended to replace advice given to you by your health care provider. Make sure you discuss any questions you have with your health care provider. Document Revised: 03/22/2018 Document Reviewed: 03/22/2018 Elsevier Patient Education  2020 [employer].

## 2020-06-11 NOTE — Progress Notes (Signed)
I agree with the above plan 
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
## 2020-06-13 ENCOUNTER — Ambulatory Visit: Payer: Medicaid Other | Admitting: Occupational Therapy

## 2020-06-13 ENCOUNTER — Ambulatory Visit: Payer: Medicaid Other | Attending: Neurology

## 2020-06-13 ENCOUNTER — Other Ambulatory Visit: Payer: Self-pay

## 2020-06-13 ENCOUNTER — Encounter: Payer: Self-pay | Admitting: Occupational Therapy

## 2020-06-13 DIAGNOSIS — R2681 Unsteadiness on feet: Secondary | ICD-10-CM | POA: Diagnosis present

## 2020-06-13 DIAGNOSIS — I619 Nontraumatic intracerebral hemorrhage, unspecified: Secondary | ICD-10-CM | POA: Insufficient documentation

## 2020-06-13 DIAGNOSIS — R269 Unspecified abnormalities of gait and mobility: Secondary | ICD-10-CM

## 2020-06-13 DIAGNOSIS — H543 Unqualified visual loss, both eyes: Secondary | ICD-10-CM | POA: Insufficient documentation

## 2020-06-13 DIAGNOSIS — M6281 Muscle weakness (generalized): Secondary | ICD-10-CM

## 2020-06-13 DIAGNOSIS — R278 Other lack of coordination: Secondary | ICD-10-CM

## 2020-06-13 NOTE — Therapy (Signed)
[facility] MAIN [facility] SERVICES [address] [place], [place], [address] Phone: [phone]   Fax:  [phone]  Physical Therapy Treatment  Patient Details  Name: Travis Chavez MRN: [id number] Date of Birth: 09-24-1960 Referring Provider (PT): Delia Heady   Encounter Date: 06/13/2020   PT End of Session - 06/13/20 1156    Visit Number 4    Number of Visits 17    Date for PT Re-Evaluation 07/15/20    PT Start Time 0924    PT Stop Time 1000    PT Time Calculation (min) 36 min    Equipment Utilized During Treatment Gait belt    Activity Tolerance Patient tolerated treatment well    Behavior During Therapy [facility] for tasks assessed/performed           Past Medical History:  Diagnosis Date  . Hypertension   . Stroke [facility])     History reviewed. No pertinent surgical history.  There were no vitals filed for this visit.   Subjective Assessment - 06/13/20 1154    Subjective Patient arrived late limiting session duration. Presents with wife. Went to the doctor yesterday and wants to work on his neck now too.    Patient is accompained by: Family member    How long can you sit comfortably? pt reports he can sit all day without problems    How long can you stand comfortably? 30 mins    How long can you walk comfortably? pt reports he can stand all day without problems    Patient Stated Goals improve overall function    Currently in Pain? Yes    Pain Score 3     Pain Location Neck    Pain Orientation Right    Pain Descriptors / Indicators Aching    Pain Type Chronic pain    Pain Onset 1 to 4 weeks ago    Pain Frequency Constant               Neck stretches per doctor request  Access Code: [email or web address] URL: [email or web address] Date: 06/13/2020 Prepared by: Precious Bard  Exercises Cervical Extension AROM with Strap - 1 x daily - 7 x weekly - 2 sets - 10 reps - 5 hold Cervical Extension and Sidebending AROM with  Strap - 1 x daily - 7 x weekly - 2 sets - 10 reps - 5 hold Ulnar Nerve Flossing - 1 x daily - 7 x weekly - 2 sets - 10 reps - 5 hold   Treatment Today:   Therex: -Sit to stand 10x first set, second set with green pad under L foot for weight shift onto RLE 10x       Neuro re-ed: airex balance beam:  -lateral stepping 4x length of // bars no UE support -tandem walk 8x length of // bars no UE support -speed ladder: one foot in each square for spatial awareness, step length, heel strike, equal body mechanics  Ambulate in hallway with horizontal, vertical, and alternating head turns 8x 160 ft. Cues for heel strike for equal body mechanics    Patient's session is limited due to patient's late arrival.  Patient is introduced to cervical stretch and nerve glides with patient demonstrating understanding. Patient is challenged with heel strike with a dual task when ambulating but improved with repetition. The patient benefits from skilled PT services to address impairmens for improved function, decreased fall risk and improved quality of life.  PT Education - 06/13/20 1155    Education Details exercise technique, body mechanics, neck stretch    Person(s) Educated Patient;Spouse    Methods Explanation;Demonstration;Tactile cues;Verbal cues;Handout    Comprehension Verbalized understanding;Returned demonstration;Verbal cues required;Tactile cues required            PT Short Term Goals - 06/02/20 1603      PT SHORT TERM GOAL #1   Title Patient will be independent in home exercise program to improve strength/mobility for better functional independence with ADLs.    Status New    Target Date 07/15/20             PT Long Term Goals - 06/02/20 1603      PT LONG TERM GOAL #1   Title Patient will increase FOTO score to equal to or greater than 78% to demonstrate statistically significant improvement in mobility and quality of life.    Baseline 64%     Status New      PT LONG TERM GOAL #2   Title Patient (< 68 years old) will complete five times sit to stand test in < 10 seconds indicating an increased LE strength and improved balance.    Baseline 16.32 seconds    Status New      PT LONG TERM GOAL #3   Title Patient will increase 10 meter walk test to >1.13m/s as to improve gait speed for better community ambulation and to reduce fall risk.    Baseline 1.5 m/s    Status New      PT LONG TERM GOAL #4   Title Patient will increase Functional Gait Assessment score to >20/30 as to reduce fall risk and improve dynamic gait safety with community ambulation    Status New                 Plan - 06/13/20 1201    Clinical Impression Statement Patient's session is limited due to patient's late arrival.  Patient is introduced to cervical stretch and nerve glides with patient demonstrating understanding. Patient is challenged with heel strike with a dual task when ambulating but improved with repetition. The patient benefits from skilled PT services to address impairmens for improved function, decreased fall risk and improved quality of life.    Personal Factors and Comorbidities Comorbidity 3+    Examination-Activity Limitations Bathing;Lift;Locomotion Level;Stand;Toileting;Carry;Stairs;Squat    Examination-Participation Restrictions Occupation;Driving;Community Activity    Stability/Clinical Decision Making Evolving/Moderate complexity    Rehab Potential Good    PT Frequency 2x / week    PT Duration 8 weeks    PT Treatment/Interventions ADLs/Self Care Home Management;Moist Heat;Gait training;Stair training;Functional mobility training;Therapeutic activities;Therapeutic exercise;Balance training;Neuromuscular re-education;Patient/family education;Passive range of motion;Visual/perceptual remediation/compensation    PT Next Visit Plan initiate balance and LE strengthening proram    PT Home Exercise Plan issue next visit    Consulted and  Agree with Plan of Care Patient;Family member/caregiver           Patient will benefit from skilled therapeutic intervention in order to improve the following deficits and impairments:  Abnormal gait,Decreased balance,Decreased endurance,Difficulty walking,Impaired vision/preception,Decreased activity tolerance,Decreased coordination,Decreased strength,Impaired flexibility,Impaired UE functional use,Postural dysfunction,Pain,Impaired perceived functional ability,Decreased range of motion  Visit Diagnosis: Muscle weakness (generalized)  Other lack of coordination  Unsteadiness on feet  Abnormality of gait and mobility     Problem List Patient Active Problem List   Diagnosis Date Noted  . Cerebral thrombosis with cerebral infarction 04/29/2020  . Change in vision 04/27/2020  . Essential hypertension  04/27/2020  . Hyperlipidemia 04/27/2020  . Obesity (BMI 30-39.9) 04/27/2020  . Pre-diabetes 02/03/2020  . Cerebral hemorrhage ([place]) 02/02/2020  . Anemia 02/02/2020  . Disorder of bursae of shoulder region 02/02/2020  . Supraventricular tachycardia ([place]) 02/02/2020  . TIA (transient ischemic attack) 02/02/2020  . 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 Osteoarthritis of left hip 11/21/2018   Janna Arch, PT, DPT   06/13/2020, 12:02 PM  [place] MAIN [facility] SERVICES [address] [place], [place], [address] Phone: [phone]   Fax:  [phone]  Name: Travis Chavez MRN: [id number] Date of Birth: 1961/02/10

## 2020-06-13 NOTE — Therapy (Addendum)
[place] MAIN [facility] SERVICES [address] [place], [place], [address] Phone: [phone]   Fax:  [phone]  Occupational Therapy Treatment  Patient Details  Name: Travis Chavez MRN: [id number] Date of Birth: December 11, 1960 No data recorded  Encounter Date: 06/13/2020   OT End of Session - 06/13/20 1637    Visit Number 4    Number of Visits 24    Date for OT Re-Evaluation 08/14/20    Authorization Type progress report period  starting 05/22/2020    OT Start Time 1000    OT Stop Time 1046    OT Time Calculation (min) 46 min    Activity Tolerance Patient tolerated treatment well    Behavior During Therapy [facility] for tasks assessed/performed           Past Medical History:  Diagnosis Date  . Hypertension   . Stroke [facility])     History reviewed. No pertinent surgical history.  There were no vitals filed for this visit.   Subjective Assessment - 06/13/20 1003    Subjective  Pt reports he found out he has to take the Brilinta the rest of his life.    Pertinent History Pt. is a 60 y.o. male who was admitted to [facility] with a TIA in 01/2020. Pt. was the admitted to [facility] on 04/27/2020. Pt.  was diagnosed with an acute infarct in the Right PCA territory, subacute left corpus collosum, and chronic infarcts in the left corona radiata, basal ganglia, and RIght cerrebellum. PMHx includes: HLD, HTN, GERD, Arrythmia, right CVA.    Currently in Pain? No/denies    Pain Score 0-No pain          Pt reports he is doing well, didn't want to get up this morning, laughing as he made this comment.  Pt reports he has been using his left hand for almost all tasks at home, feeds himself now with left hand.  Wife reports she has encouraged him to use his right hand more at home.   Therapeutic Exercises:   Grip strengthening with use of hand gripper with emphasis on sustained gripping patterns with right hand to pick up and move objects in  a vertical position from board to container, able to complete on first setting of handgripper, 6# of resistance for 20 reps for 2 sets.   Reaching tasks to pick up and place objects tabletop height with right UE from seated position.    Therapeutic Activities Patient seen for visual scanning task with use of playing cards and utilizing a scatter pile format.  Patient putting cards in ascending order sorting by each suit (4 piles) .  Pt demonstrates difficulty with using right hand to pick up and manage cards.  Cues at times for visual scanning to find select cards, increased time allowed for task.    Response to tx:   Patient progressing well, minimal cues for visual scanning task.  Decreased use of right UE for necessary daily tasks, encouraged pt to use right UE during self feeding tasks and when attempting to pick up items and place to and from the table.  R hand fatigues quickly with tasks and requires rest breaks.  Continue to encourage bilateral UE tasks.  Continue OT to maximize safety and independence in ADL and IADL tasks.                   OT Education - 06/13/20 1637    Education Details Visual  scanning, RUE use, coordination    Person(s) Educated Patient    Methods Explanation;Demonstration    Comprehension Verbalized understanding;Returned demonstration               OT Long Term Goals - 05/22/20 1456      OT LONG TERM GOAL #1   Title Pt. will improvie LUE strength by 2 mm grades to improve ADL, and IADL functioning.    Baseline Eval: Pt. presents with decreased LUE strength limiting ADL functioning.    Time 12    Period Weeks    Status New    Target Date 08/14/20      OT LONG TERM GOAL #2   Title Pt. will demonstrate visual compensatory strategies 100% of the time for navigating through his environment.    Baseline Eval:  pt. requires assist to navigate through his environment safely    Time 12    Period Weeks    Status New    Target Date 08/14/20       OT LONG TERM GOAL #3   Title Pt. will demonstrate visual compensatory strategies 100% of the time to be able to complete tabletip top tasks independently    Baseline Eval: pt. requires assist    Time 12    Period Weeks    Status New    Target Date 08/14/20      OT LONG TERM GOAL #4   Title Pt. will accurately probelm solve through safety awareness, and judgement questions for ADLs/IADLs with 100% accuracy.    Baseline Eval: TBD    Time 12    Period Weeks    Status New    Target Date 08/14/20      OT LONG TERM GOAL #5   Title Pt. will improve FOTO score by 2 points for improved functional outcomes.    Baseline Eval: FOTO score: 72 (80 typical result)    Time 12    Period Weeks    Status New    Target Date 08/14/20                 Plan - 06/13/20 1634    Clinical Impression Statement Patient progressing well, minimal cues for visual scanning task.  Decreased use of right UE for necessary daily tasks, encouraged pt to use right UE during self feeding tasks and when attempting to pick up items and place to and from the table.  R hand fatigues quickly with tasks and requires rest breaks.  Continue to encourage bilateral UE tasks.  Continue OT to maximize safety and independence in ADL and IADL tasks.    OT Occupational Profile and History Problem Focused Assessment - Including review of records relating to presenting problem    Occupational performance deficits (Please refer to evaluation for details): ADL's;IADL's;Education    Rehab Potential Excellent    Clinical Decision Making Several treatment options, min-mod task modification necessary    Comorbidities Affecting Occupational Performance: May have comorbidities impacting occupational performance    Modification or Assistance to Complete Evaluation  Min-Moderate modification of tasks or assist with assess necessary to complete eval    OT Frequency 2x / week    OT Duration 12 weeks    OT Treatment/Interventions  Self-care/ADL training;Therapeutic exercise;Neuromuscular education;Therapeutic activities;Patient/family education;DME and/or AE instruction;Energy conservation    Consulted and Agree with Plan of Care Patient           Patient will benefit from skilled therapeutic intervention in order to improve the following deficits and impairments:  Visit Diagnosis: Muscle weakness (generalized)  Cerebral hemorrhage (HCC)  Other lack of coordination    Problem List Patient Active Problem List   Diagnosis Date Noted  . Cerebral thrombosis with cerebral infarction 04/29/2020  . Change in vision 04/27/2020  . Essential hypertension 04/27/2020  . Hyperlipidemia 04/27/2020  . Obesity (BMI 30-39.9) 04/27/2020  . Pre-diabetes 02/03/2020  . Cerebral hemorrhage (HCC) 02/02/2020  . Anemia 02/02/2020  . Disorder of bursae of shoulder region 02/02/2020  . Supraventricular tachycardia (HCC) 02/02/2020  . TIA (transient ischemic attack) 02/02/2020  . 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 Osteoarthritis of left hip 11/21/2018   Emillee Talsma T Arne Cleveland, OTR/L, CLT  Juna Caban 06/13/2020, 4:38 PM  [place] [facility] MAIN [facility] SERVICES [address] [place], [place], [address] Phone: [phone]   Fax:  [phone]  Name: Travis Chavez MRN: [id number] Date of Birth: [birth year]

## 2020-06-16 ENCOUNTER — Ambulatory Visit: Payer: Medicaid Other

## 2020-06-16 ENCOUNTER — Encounter: Payer: Self-pay | Admitting: Occupational Therapy

## 2020-06-16 ENCOUNTER — Ambulatory Visit: Payer: Medicaid Other | Admitting: Occupational Therapy

## 2020-06-16 ENCOUNTER — Other Ambulatory Visit: Payer: Self-pay

## 2020-06-16 DIAGNOSIS — R2681 Unsteadiness on feet: Secondary | ICD-10-CM

## 2020-06-16 DIAGNOSIS — M6281 Muscle weakness (generalized): Secondary | ICD-10-CM | POA: Diagnosis not present

## 2020-06-16 DIAGNOSIS — R278 Other lack of coordination: Secondary | ICD-10-CM

## 2020-06-16 DIAGNOSIS — I619 Nontraumatic intracerebral hemorrhage, unspecified: Secondary | ICD-10-CM

## 2020-06-16 DIAGNOSIS — R269 Unspecified abnormalities of gait and mobility: Secondary | ICD-10-CM

## 2020-06-16 DIAGNOSIS — H543 Unqualified visual loss, both eyes: Secondary | ICD-10-CM

## 2020-06-16 NOTE — Therapy (Signed)
[facility] MAIN [facility] SERVICES [address] [place], [place], [address] Phone: [phone]   Fax:  [phone]  Physical Therapy Treatment  Patient Details  Name: Travis Chavez MRN: [id number] Date of Birth: [birth year] Referring Provider (PT): Delia Heady   Encounter Date: 06/16/2020   PT End of Session - 06/16/20 1034    Visit Number 5    Number of Visits 17    Date for PT Re-Evaluation 07/15/20    PT Start Time 0928    PT Stop Time 1016    PT Time Calculation (min) 48 min    Equipment Utilized During Treatment Gait belt    Activity Tolerance Patient tolerated treatment well    Behavior During Therapy [facility] for tasks assessed/performed           Past Medical History:  Diagnosis Date  . Hypertension   . Stroke [facility])     History reviewed. No pertinent surgical history.  There were no vitals filed for this visit.   Subjective Assessment - 06/16/20 0923    Subjective Pt reports he is doing OK today and has just had OT appointment. Pt reports no neck pain currently, but does state "it will go away and come back." Pt reports doing neck stretches. Pt thinks his neck pain is from the way he is sleeping.    Patient is accompained by: Family member    How long can you sit comfortably? pt reports he can sit all day without problems    How long can you stand comfortably? 30 mins    How long can you walk comfortably? pt reports he can stand all day without problems    Patient Stated Goals improve overall function    Currently in Pain? No/denies    Pain Onset 1 to 4 weeks ago         Treatment Today:  Nustep level 4 x 5 min  Therex: -Sit to stand 1x8, 1x12 with green ball between hands, 1x12 with red ball between hands -standing hamstring curls 2x16, 1x12 with 2.5# ankle weights. Verbal cuing and demonstration provided for technique.   Neuro re-ed: -airex tandem stance R/L with intermittent UE support 2x 30 sec each -airex feet  together EO x 30 sec - airex feet together EC x 30 sec - airex WBOS with horizontal head turns x 30 sec - airex WBOS with vertical head turns x 30 sec  Korebalance: verbal cues provided  Penguin sled - x several minutes, unilateral UE support, SBA-CGA SLD-Ball - x several minutes, unilateral UE support, SBA   Assessment: Advanced pt therex this session with use of increased repetitions and weights, where pt tolerated exercises well. Pt greatest difficulty with balance exercises on Korebalance, demonstrating difficulty modulating degree of weight-shifts on B LEs to meet targets, requiring unilateral UE support, verbal cues and SBA-CGA throughout. Pt will benefit from further skilled therapy to increase B LE strength and balance to improve QOL and ease and safety with all functional activities.        PT Education - 06/16/20 1030    Education Details Pt educated on technique with Korebalance exercises    Person(s) Educated Patient    Methods Explanation;Demonstration;Verbal cues    Comprehension Verbalized understanding;Returned demonstration            PT Short Term Goals - 06/02/20 1603      PT SHORT TERM GOAL #1   Title Patient will be independent in home exercise program to improve  strength/mobility for better functional independence with ADLs.    Status New    Target Date 07/15/20             PT Long Term Goals - 06/02/20 1603      PT LONG TERM GOAL #1   Title Patient will increase FOTO score to equal to or greater than 78% to demonstrate statistically significant improvement in mobility and quality of life.    Baseline 64%    Status New      PT LONG TERM GOAL #2   Title Patient (< 83 years old) will complete five times sit to stand test in < 10 seconds indicating an increased LE strength and improved balance.    Baseline 16.32 seconds    Status New      PT LONG TERM GOAL #3   Title Patient will increase 10 meter walk test to >1.35m/s as to improve gait speed for  better community ambulation and to reduce fall risk.    Baseline 1.5 m/s    Status New      PT LONG TERM GOAL #4   Title Patient will increase Functional Gait Assessment score to >20/30 as to reduce fall risk and improve dynamic gait safety with community ambulation    Status New                 Plan - 06/16/20 1031    Clinical Impression Statement Advanced pt therex this session with use of increased repetitions and weights, where pt tolerated exercises well. Pt greatest difficulty with balance exercises on Korebalance, demonstrating difficulty modulating degree of weight-shifts on B LEs to meet targets, requiring unilateral UE support, verbal cues and SBA-CGA throughout. Pt will benefit from further skilled therapy to increase B LE strength and balance to improve QOL and ease and safety with all functional activities.    Personal Factors and Comorbidities Comorbidity 3+    Examination-Activity Limitations Bathing;Lift;Locomotion Level;Stand;Toileting;Carry;Stairs;Squat    Examination-Participation Restrictions Occupation;Driving;Community Activity    Stability/Clinical Decision Making Evolving/Moderate complexity    Rehab Potential Good    PT Frequency 2x / week    PT Duration 8 weeks    PT Treatment/Interventions ADLs/Self Care Home Management;Moist Heat;Gait training;Stair training;Functional mobility training;Therapeutic activities;Therapeutic exercise;Balance training;Neuromuscular re-education;Patient/family education;Passive range of motion;Visual/perceptual remediation/compensation    PT Next Visit Plan Advance balance and strengthening exercises    PT Home Exercise Plan issue next visit    Consulted and Agree with Plan of Care Patient;Family member/caregiver           Patient will benefit from skilled therapeutic intervention in order to improve the following deficits and impairments:  Abnormal gait,Decreased balance,Decreased endurance,Difficulty walking,Impaired  vision/preception,Decreased activity tolerance,Decreased coordination,Decreased strength,Impaired flexibility,Impaired UE functional use,Postural dysfunction,Pain,Impaired perceived functional ability,Decreased range of motion  Visit Diagnosis: Muscle weakness (generalized)  Unsteadiness on feet  Abnormality of gait and mobility  Other lack of coordination  Cerebral hemorrhage [facility])     Problem List Patient Active Problem List   Diagnosis Date Noted  . Cerebral thrombosis with cerebral infarction 04/29/2020  . Change in vision 04/27/2020  . Essential hypertension 04/27/2020  . Hyperlipidemia 04/27/2020  . Obesity (BMI 30-39.9) 04/27/2020  . Pre-diabetes 02/03/2020  . Cerebral hemorrhage (HCC) 02/02/2020  . Anemia 02/02/2020  . Disorder of bursae of shoulder region 02/02/2020  . Supraventricular tachycardia (HCC) 02/02/2020  . TIA (transient ischemic attack) 02/02/2020  . 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 Osteoarthritis of left hip 11/21/2018  Temple Pacini PT, DPT  Baird Kay  06/16/2020, 10:44 AM  [place] [facility] MAIN [facility] SERVICES [address] [place], [place], [address] Phone: [phone]   Fax:  [phone]  Name: Travis Chavez MRN: [id number] Date of Birth: 1961/02/22

## 2020-06-16 NOTE — Therapy (Signed)
[place] [facility] MAIN [facility] SERVICES [address] [place], [place], [address] Phone: [phone]   Fax:  [phone]  Occupational Therapy Treatment  Patient Details  Name: Travis Chavez MRN: [id number] Date of Birth: December 26, 1960 No data recorded  Encounter Date: 06/16/2020   OT End of Session - 06/16/20 0859    Visit Number 5    Number of Visits 24    Date for OT Re-Evaluation 08/14/20    Authorization Type progress report period  starting 05/22/2020    OT Start Time 0830    OT Stop Time 0915    OT Time Calculation (min) 45 min    Activity Tolerance Patient tolerated treatment well    Behavior During Therapy [facility] for tasks assessed/performed           Past Medical History:  Diagnosis Date  . Hypertension   . Stroke [facility])     History reviewed. No pertinent surgical history.  There were no vitals filed for this visit.   Subjective Assessment - 06/16/20 0857    Subjective  Pt. reports that he can see things in his perpheral vision better.    Pertinent History Pt. is a 60 y.o. male who was admitted to [facility] with a TIA in 01/2020. Pt. was the admitted to [facility] on 04/27/2020. Pt.  was diagnosed with an acute infarct in the Right PCA territory, subacute left corpus collosum, and chronic infarcts in the left corona radiata, basal ganglia, and RIght cerrebellum. PMHx includes: HLD, HTN, GERD, Arrythmia, right CVA.    Currently in Pain? Yes          OT TREATMENT    Therapeutic Activities:  Pt. Worked on visual scanning, and visual search strategies with emphasis placed on visually scanning to the far right, and the far left using numbered dominoes, and cards. Pt. Worked on visual memory, attention, and concentration with numbered cards.  Pt. Reports that he woke up this morning, and has noticed improvements in his vision. Pt. reports that he is able to see more if his peripheral vision when looking at an object. Pt.  Reports that he was able to see more when riding in the car on his way here. Pt. was able to organize dominoes by number while selecting the pieces from a group of dominoes placed randomly to the far left, and far right. Pt. worked sequencing cards by suit after selecting them from a groups of cards placed randomly to the far left, and the far right. Pt. was accurately able to sequence the cards, and organize the dominoes. Pt. Required cues, and increased time to complete visual memory, attention, and concentration with playng cards.  Education was provided to the pt. And his wife about activities to try at home to practice these skills.                          OT Education - 06/16/20 0859    Education Details Visual scanning, RUE use, coordination    Person(s) Educated Patient    Methods Explanation;Demonstration    Comprehension Verbalized understanding;Returned demonstration               OT Long Term Goals - 05/22/20 1456      OT LONG TERM GOAL #1   Title Pt. will improvie LUE strength by 2 mm grades to improve ADL, and IADL functioning.    Baseline Eval: Pt. presents with decreased LUE strength  limiting ADL functioning.    Time 12    Period Weeks    Status New    Target Date 08/14/20      OT LONG TERM GOAL #2   Title Pt. will demonstrate visual compensatory strategies 100% of the time for navigating through his environment.    Baseline Eval:  pt. requires assist to navigate through his environment safely    Time 12    Period Weeks    Status New    Target Date 08/14/20      OT LONG TERM GOAL #3   Title Pt. will demonstrate visual compensatory strategies 100% of the time to be able to complete tabletip top tasks independently    Baseline Eval: pt. requires assist    Time 12    Period Weeks    Status New    Target Date 08/14/20      OT LONG TERM GOAL #4   Title Pt. will accurately probelm solve through safety awareness, and judgement questions for  ADLs/IADLs with 100% accuracy.    Baseline Eval: TBD    Time 12    Period Weeks    Status New    Target Date 08/14/20      OT LONG TERM GOAL #5   Title Pt. will improve FOTO score by 2 points for improved functional outcomes.    Baseline Eval: FOTO score: 72 (80 typical result)    Time 12    Period Weeks    Status New    Target Date 08/14/20                 Plan - 06/16/20 0900    Clinical Impression Statement Pt. Reports that he woke up this morning, and has noticed improvements in his vision. Pt. reports that he is able to see more if his peripheral vision when looking at an object. Pt. Reports that he was able to see more when riding in the car on his way here. Pt. was able to organize dominoes by number while selecting the pieces from a group of dominoes placed randomly to the far left, and far right. Pt. worked sequencing cards by suit after selecting them from a groups of cards placed randomly to the far left, and the far right. Pt. was accurately able to sequence the cards, and organize the dominoes. Pt. Required cues, and increased time to complete visual memory, attention, and concentration with playng cards.  Education was provided to the pt. And his wife about activities to try at home to practice these skills.     OT Occupational Profile and History Problem Focused Assessment - Including review of records relating to presenting problem    Occupational performance deficits (Please refer to evaluation for details): ADL's;IADL's;Education    Rehab Potential Excellent    Clinical Decision Making Several treatment options, min-mod task modification necessary    Comorbidities Affecting Occupational Performance: May have comorbidities impacting occupational performance    Modification or Assistance to Complete Evaluation  Min-Moderate modification of tasks or assist with assess necessary to complete eval    OT Frequency 2x / week    OT Duration 12 weeks    OT  Treatment/Interventions Self-care/ADL training;Therapeutic exercise;Neuromuscular education;Therapeutic activities;Patient/family education;DME and/or AE instruction;Energy conservation    Consulted and Agree with Plan of Care Patient           Patient will benefit from skilled therapeutic intervention in order to improve the following deficits and impairments:  Visit Diagnosis: Muscle weakness (generalized)  Low vision, both eyes    Problem List Patient Active Problem List   Diagnosis Date Noted  . Cerebral thrombosis with cerebral infarction 04/29/2020  . Change in vision 04/27/2020  . Essential hypertension 04/27/2020  . Hyperlipidemia 04/27/2020  . Obesity (BMI 30-39.9) 04/27/2020  . Pre-diabetes 02/03/2020  . Cerebral hemorrhage (HCC) 02/02/2020  . Anemia 02/02/2020  . Disorder of bursae of shoulder region 02/02/2020  . Supraventricular tachycardia (HCC) 02/02/2020  . TIA (transient ischemic attack) 02/02/2020  . 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 Osteoarthritis of left hip 11/21/2018    Olegario Messier, MS, OTR/L 06/16/2020, 9:04 AM  [facility] MAIN [facility] SERVICES [address] [place], [place], [address] Phone: [phone]   Fax:  [phone]  Name: Travis Chavez MRN: [id number] Date of Birth: 06-26-1960

## 2020-06-18 ENCOUNTER — Ambulatory Visit: Payer: Medicaid Other | Admitting: Occupational Therapy

## 2020-06-18 ENCOUNTER — Ambulatory Visit: Payer: Medicaid Other | Admitting: Physical Therapy

## 2020-06-18 ENCOUNTER — Encounter: Payer: Self-pay | Admitting: Occupational Therapy

## 2020-06-18 ENCOUNTER — Other Ambulatory Visit: Payer: Self-pay

## 2020-06-18 DIAGNOSIS — I619 Nontraumatic intracerebral hemorrhage, unspecified: Secondary | ICD-10-CM

## 2020-06-18 DIAGNOSIS — M6281 Muscle weakness (generalized): Secondary | ICD-10-CM | POA: Diagnosis not present

## 2020-06-18 DIAGNOSIS — R269 Unspecified abnormalities of gait and mobility: Secondary | ICD-10-CM

## 2020-06-18 DIAGNOSIS — R278 Other lack of coordination: Secondary | ICD-10-CM

## 2020-06-18 DIAGNOSIS — H543 Unqualified visual loss, both eyes: Secondary | ICD-10-CM

## 2020-06-18 DIAGNOSIS — R2681 Unsteadiness on feet: Secondary | ICD-10-CM

## 2020-06-18 NOTE — Therapy (Signed)
[place] [facility] MAIN [facility] SERVICES [address] [place], [place], [address] Phone: [phone]   Fax:  [phone]  Occupational Therapy Treatment  Patient Details  Name: Travis Chavez MRN: [id number] Date of Birth: 29-Mar-1961 No data recorded  Encounter Date: 06/18/2020   OT End of Session - 06/20/20 1650    Visit Number 6    Number of Visits 24    Date for OT Re-Evaluation 08/14/20    Authorization Type progress report period  starting 05/22/2020    OT Start Time 0915    OT Stop Time 1000    OT Time Calculation (min) 45 min    Activity Tolerance Patient tolerated treatment well    Behavior During Therapy [facility] for tasks assessed/performed           Past Medical History:  Diagnosis Date  . Hypertension   . Stroke [facility])     History reviewed. No pertinent surgical history.  There were no vitals filed for this visit.   Subjective Assessment - 06/20/20 1650    Subjective  Pt reports his vision has been better this week, noticed it most when riding in the car, still has a "haze" in peripheral vision but reports he is more aware of things this week.    Pertinent History Pt. is a 60 y.o. male who was admitted to [facility] with a TIA in 01/2020. Pt. was the admitted to [facility] on 04/27/2020. Pt.  was diagnosed with an acute infarct in the Right PCA territory, subacute left corpus collosum, and chronic infarcts in the left corona radiata, basal ganglia, and RIght cerrebellum. PMHx includes: HLD, HTN, GERD, Arrythmia, right CVA.    Patient Stated Goals To be independent as possible, "do as much as I can"    Currently in Pain? No/denies    Pain Score 0-No pain          wife, [name] present with patient today  Therapeutic Activities:  Patient seen for focus on visual scanning and perceptual skills with use of tetris puzzle with "key" of puzzle to complete design copy.   Lighting adjusted in the gym area due to pt reporting  glare from key.  Once lighting was adjusted he was able to see design better and complete puzzle with 2 cues to correct items misplaced.  Slow to complete task but able to attend to task throughout and to completion.  Tends to use his left hand to pick up pieces of puzzle and required cues to use right hand for task, decrease manipulation skills on the right.     Response to tx:   Pt continues to progress well, patient is engaging in more tasks at home on a daily basis.  Requires occasional cues for visual attention and directions for task.  Still tends to use his non dominant hand to initiate movements with tasks but responds well to cues.   Continue to work towards goals in plan of care to maximize safety and independence in necessary daily tasks.                        OT Education - 06/20/20 1650    Education Details Visual scanning, RUE use, coordination    Person(s) Educated Patient    Methods Explanation;Demonstration    Comprehension Verbalized understanding;Returned demonstration               OT Long Term Goals - 05/22/20 1456  OT LONG TERM GOAL #1   Title Pt. will improvie LUE strength by 2 mm grades to improve ADL, and IADL functioning.    Baseline Eval: Pt. presents with decreased LUE strength limiting ADL functioning.    Time 12    Period Weeks    Status New    Target Date 08/14/20      OT LONG TERM GOAL #2   Title Pt. will demonstrate visual compensatory strategies 100% of the time for navigating through his environment.    Baseline Eval:  pt. requires assist to navigate through his environment safely    Time 12    Period Weeks    Status New    Target Date 08/14/20      OT LONG TERM GOAL #3   Title Pt. will demonstrate visual compensatory strategies 100% of the time to be able to complete tabletip top tasks independently    Baseline Eval: pt. requires assist    Time 12    Period Weeks    Status New    Target Date 08/14/20      OT  LONG TERM GOAL #4   Title Pt. will accurately probelm solve through safety awareness, and judgement questions for ADLs/IADLs with 100% accuracy.    Baseline Eval: TBD    Time 12    Period Weeks    Status New    Target Date 08/14/20      OT LONG TERM GOAL #5   Title Pt. will improve FOTO score by 2 points for improved functional outcomes.    Baseline Eval: FOTO score: 72 (80 typical result)    Time 12    Period Weeks    Status New    Target Date 08/14/20                 Plan - 06/20/20 1651    Clinical Impression Statement Pt continues to progress well, patient is engaging in more tasks at home on a daily basis.  Requires occasional cues for visual attention and directions for task.  Still tends to use his non dominant hand to initiate movements with tasks but responds well to cues.   Continue to work towards goals in plan of care to maximize safety and independence in necessary daily tasks.    OT Occupational Profile and History Problem Focused Assessment - Including review of records relating to presenting problem    Occupational performance deficits (Please refer to evaluation for details): ADL's;IADL's;Education    Rehab Potential Excellent    Clinical Decision Making Several treatment options, min-mod task modification necessary    Comorbidities Affecting Occupational Performance: May have comorbidities impacting occupational performance    Modification or Assistance to Complete Evaluation  Min-Moderate modification of tasks or assist with assess necessary to complete eval    OT Frequency 2x / week    OT Duration 12 weeks    OT Treatment/Interventions Self-care/ADL training;Therapeutic exercise;Neuromuscular education;Therapeutic activities;Patient/family education;DME and/or AE instruction;Energy conservation    Consulted and Agree with Plan of Care Patient           Patient will benefit from skilled therapeutic intervention in order to improve the following deficits  and impairments:           Visit Diagnosis: Muscle weakness (generalized)  Other lack of coordination  Low vision, both eyes    Problem List Patient Active Problem List   Diagnosis Date Noted  . Cerebral thrombosis with cerebral infarction 04/29/2020  . Change in vision 04/27/2020  .  Essential hypertension 04/27/2020  . Hyperlipidemia 04/27/2020  . Obesity (BMI 30-39.9) 04/27/2020  . Pre-diabetes 02/03/2020  . Cerebral hemorrhage (HCC) 02/02/2020  . Anemia 02/02/2020  . Disorder of bursae of shoulder region 02/02/2020  . Supraventricular tachycardia (HCC) 02/02/2020  . TIA (transient ischemic attack) 02/02/2020  . 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 Osteoarthritis of left hip 11/21/2018   Rebekah Sprinkle T Arne Cleveland, OTR/L, CLT  Martell Mcfadyen 06/20/2020, 4:55 PM  [place] [facility] MAIN [facility] SERVICES [address] [place], [place], [address] Phone: [phone]   Fax:  [phone]  Name: MICKELL BIRDWELL MRN: [id number] Date of Birth: Nov 05, 1960

## 2020-06-18 NOTE — Therapy (Signed)
[place] [facility] MAIN [facility] SERVICES [address] [place], [place], [address] Phone: [phone]   Fax:  [phone]  Physical Therapy Treatment  Patient Details  Name: Travis Chavez MRN: [id number] Date of Birth: Jul 13, 1960 Referring Provider (PT): Delia Heady   Encounter Date: 06/18/2020   PT End of Session - 06/18/20 0949    Visit Number 6    Number of Visits 17    Date for PT Re-Evaluation 07/15/20    PT Start Time 1007    PT Stop Time 1052    PT Time Calculation (min) 45 min    Equipment Utilized During Treatment Gait belt    Activity Tolerance Patient tolerated treatment well    Behavior During Therapy [facility] for tasks assessed/performed           Past Medical History:  Diagnosis Date  . Hypertension   . Stroke [facility])     History reviewed. No pertinent surgical history.  There were no vitals filed for this visit.   Subjective Assessment - 06/18/20 1010    Subjective Patient denies of any new symptoms or pain since last therapy session. He denies of any falls or injuries.    Patient is accompained by: Family member    How long can you sit comfortably? pt reports he can sit all day without problems    How long can you stand comfortably? 30 mins    How long can you walk comfortably? pt reports he can stand all day without problems    Patient Stated Goals improve overall function    Currently in Pain? No/denies    Pain Onset 1 to 4 weeks ago           Treatment Today:  Nustep level 4 x 5 min  Therex: -seated LAQ x 2 15 reps with 2.5# each leg  -standing hip abduction x 2 15 reps with 2.5# each leg -sit to stands x 10, x 7, x 5 without BUE support  Neuro re-ed: -airex beam tandem walking without BUE support x 3 laps  -airex beam side stepping without BUE support x 3 laps  -airex purple step ups x 10 reps each BLE without BUE support  -airex x normal stance x throwing darts -airex x WBOS x throwing darts  -airex x  NBOS x throwing darts    Clinical Impression: Patient tolerated strengthening exercises with fair tolerance to activity. He reports increased muscle fatigue and "burning" when completing standing hip exercises on LLE due to weakness secondary to hip surgery in 2020. Patient demonstrates increased postural swaying with tandem walking and standing due to decreased proprioception cues and decreased visual acuity. Patient will continue to benefit from skilled physical therapy to improve generalized strength, ROM, and capacity for functional activity.        PT Short Term Goals - 06/02/20 1603      PT SHORT TERM GOAL #1   Title Patient will be independent in home exercise program to improve strength/mobility for better functional independence with ADLs.    Status New    Target Date 07/15/20             PT Long Term Goals - 06/02/20 1603      PT LONG TERM GOAL #1   Title Patient will increase FOTO score to equal to or greater than 78% to demonstrate statistically significant improvement in mobility and quality of life.    Baseline 64%    Status New  PT LONG TERM GOAL #2   Title Patient (< 33 years old) will complete five times sit to stand test in < 10 seconds indicating an increased LE strength and improved balance.    Baseline 16.32 seconds    Status New      PT LONG TERM GOAL #3   Title Patient will increase 10 meter walk test to >1.33m/s as to improve gait speed for better community ambulation and to reduce fall risk.    Baseline 1.5 m/s    Status New      PT LONG TERM GOAL #4   Title Patient will increase Functional Gait Assessment score to >20/30 as to reduce fall risk and improve dynamic gait safety with community ambulation    Status New                 Plan - 06/18/20 1059    Clinical Impression Statement Patient tolerated strengthening exercises with fair tolerance to activity. He reports increased muscle fatigue and "burning" when completing standing hip  exercises on LLE due to weakness secondary to hip surgery in 2020. Patient demonstrates increased postural swaying with tandem walking and standing due to decreased proprioception cues and decreased visual acuity. Patient will continue to benefit from skilled physical therapy to improve generalized strength, ROM, and capacity for functional activity.    Personal Factors and Comorbidities Comorbidity 3+    Examination-Activity Limitations Bathing;Lift;Locomotion Level;Stand;Toileting;Carry;Stairs;Squat    Examination-Participation Restrictions Occupation;Driving;Community Activity    Stability/Clinical Decision Making Evolving/Moderate complexity    Rehab Potential Good    PT Frequency 2x / week    PT Duration 8 weeks    PT Treatment/Interventions ADLs/Self Care Home Management;Moist Heat;Gait training;Stair training;Functional mobility training;Therapeutic activities;Therapeutic exercise;Balance training;Neuromuscular re-education;Patient/family education;Passive range of motion;Visual/perceptual remediation/compensation    PT Next Visit Plan Advance balance and strengthening exercises    PT Home Exercise Plan issue next visit    Consulted and Agree with Plan of Care Patient;Family member/caregiver           Patient will benefit from skilled therapeutic intervention in order to improve the following deficits and impairments:  Abnormal gait,Decreased balance,Decreased endurance,Difficulty walking,Impaired vision/preception,Decreased activity tolerance,Decreased coordination,Decreased strength,Impaired flexibility,Impaired UE functional use,Postural dysfunction,Pain,Impaired perceived functional ability,Decreased range of motion  Visit Diagnosis: Muscle weakness (generalized)  Other lack of coordination  Abnormality of gait and mobility  Unsteadiness on feet  Cerebral hemorrhage [facility])     Problem List Patient Active Problem List   Diagnosis Date Noted  . Cerebral thrombosis with  cerebral infarction 04/29/2020  . Change in vision 04/27/2020  . Essential hypertension 04/27/2020  . Hyperlipidemia 04/27/2020  . Obesity (BMI 30-39.9) 04/27/2020  . Pre-diabetes 02/03/2020  . Cerebral hemorrhage (HCC) 02/02/2020  . Anemia 02/02/2020  . Disorder of bursae of shoulder region 02/02/2020  . Supraventricular tachycardia (HCC) 02/02/2020  . TIA (transient ischemic attack) 02/02/2020  . 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 Osteoarthritis of left hip 11/21/2018   Jillyn Hidden PT, DPT Amelia Jo 06/18/2020, 11:00 AM  [place] [facility] MAIN [facility] SERVICES [address] [place], [place], [address] Phone: [phone]   Fax:  [phone]  Name: ZACK CRAGER MRN: [id number] Date of Birth: 1961-01-10

## 2020-06-23 ENCOUNTER — Ambulatory Visit: Payer: Medicaid Other

## 2020-06-23 ENCOUNTER — Ambulatory Visit: Payer: Medicaid Other | Admitting: Occupational Therapy

## 2020-06-25 ENCOUNTER — Ambulatory Visit: Payer: Medicaid Other | Admitting: Occupational Therapy

## 2020-06-25 ENCOUNTER — Ambulatory Visit: Payer: Medicaid Other

## 2020-06-30 ENCOUNTER — Ambulatory Visit: Payer: Medicaid Other | Admitting: Occupational Therapy

## 2020-06-30 ENCOUNTER — Other Ambulatory Visit: Payer: Self-pay

## 2020-06-30 ENCOUNTER — Ambulatory Visit: Payer: Medicaid Other

## 2020-06-30 ENCOUNTER — Encounter: Payer: Self-pay | Admitting: Occupational Therapy

## 2020-06-30 DIAGNOSIS — R278 Other lack of coordination: Secondary | ICD-10-CM

## 2020-06-30 DIAGNOSIS — M6281 Muscle weakness (generalized): Secondary | ICD-10-CM

## 2020-06-30 DIAGNOSIS — R2681 Unsteadiness on feet: Secondary | ICD-10-CM

## 2020-06-30 DIAGNOSIS — R269 Unspecified abnormalities of gait and mobility: Secondary | ICD-10-CM

## 2020-06-30 NOTE — Therapy (Signed)
[place] [facility] MAIN [facility] SERVICES [address] [place], [place], [address] Phone: [phone]   Fax:  [phone]  Physical Therapy Treatment  Patient Details  Name: Travis Chavez MRN: [id number] Date of Birth: Feb 21, 1961 Referring Provider (PT): Delia Heady   Encounter Date: 06/30/2020   PT End of Session - 06/30/20 1226    Visit Number 7    Number of Visits 17    Date for PT Re-Evaluation 07/15/20    PT Start Time 1019    PT Stop Time 1100    PT Time Calculation (min) 41 min    Equipment Utilized During Treatment Gait belt    Activity Tolerance Patient tolerated treatment well    Behavior During Therapy [facility] for tasks assessed/performed           Past Medical History:  Diagnosis Date  . Hypertension   . Stroke [facility])     History reviewed. No pertinent surgical history.  There were no vitals filed for this visit.   Subjective Assessment - 06/30/20 1009    Subjective Pt reports R UE ache but reports he feels alright, but also reports no pain. Pt reports doing HEP. Pt spouse present and reports pt going up and down the stairs using B rails at home.    Patient is accompained by: Family member    How long can you sit comfortably? pt reports he can sit all day without problems    How long can you stand comfortably? 30 mins    How long can you walk comfortably? pt reports he can stand all day without problems    Patient Stated Goals improve overall function    Currently in Pain? No/denies    Pain Onset 1 to 4 weeks ago           Therex: -seated LAQ  1x15 reps with 3# each leg, 1x10 4# each leg -sit to stands 1x10 without use of UEes -staggered STS 1x8 B LEs, pt rates exercise as "hard" -Standing hip abduction with 3# AW 2x5 B LEs, B UE support. Pt rates exercise "hard" and greater difficulty with R LE as stance leg compared to L. -standing hip flexor stretch 2x30 sec B LEs, with B UE support on bar     Neuro re-ed: -half foam  tandem walking with BUE to unilateral UE support, CGA x6 laps -airex pad forward/backward step pus 2x10 with unilateral UE support, CGA, VC for foot clearance and larger steps -airex pad lateral stepping both sides -2x10 each way, VC and demonstration for increased step length and foot clearance  Assessment: Progressed pt to 3# and 4# AW with LAQ and standing hip abduction. Pt did have difficulty with standing hip abduction rating it "hard," and had difficulty with R LE as stance leg compared to left, so pt performed decreased reps of exercise. Pt required frequent VC with forward/backward/lateral step-up exercises on foam to increase foot clearance and step length. Pt will benefit from further skilled PT to improve B LE strength and balance in order to improve ease with all functional mobility.      PT Education - 06/30/20 1225    Education Details Pt educated on lateral and forward/backward step-ups technique    Person(s) Educated Patient    Methods Explanation;Demonstration;Verbal cues;Tactile cues    Comprehension Verbalized understanding;Returned demonstration            PT Short Term Goals - 06/02/20 1603      PT SHORT TERM GOAL #  1   Title Patient will be independent in home exercise program to improve strength/mobility for better functional independence with ADLs.    Status New    Target Date 07/15/20             PT Long Term Goals - 06/02/20 1603      PT LONG TERM GOAL #1   Title Patient will increase FOTO score to equal to or greater than 78% to demonstrate statistically significant improvement in mobility and quality of life.    Baseline 64%    Status New      PT LONG TERM GOAL #2   Title Patient (< 33 years old) will complete five times sit to stand test in < 10 seconds indicating an increased LE strength and improved balance.    Baseline 16.32 seconds    Status New      PT LONG TERM GOAL #3   Title Patient will increase 10 meter walk test to >1.80m/s as to  improve gait speed for better community ambulation and to reduce fall risk.    Baseline 1.5 m/s    Status New      PT LONG TERM GOAL #4   Title Patient will increase Functional Gait Assessment score to >20/30 as to reduce fall risk and improve dynamic gait safety with community ambulation    Status New                 Plan - 06/30/20 1254    Clinical Impression Statement Progressed pt to 3# and 4# AW with LAQ and standing hip abduction. Pt did have difficulty with standing hip abduction rating it "hard," and had difficulty with R LE as stance leg compared to left, so pt performed decreased reps of exercise. Pt required frequent VC with forward/backward/lateral step-up exercises on foam to increase foot clearance and step length. Pt will benefit from further skilled PT to improve B LE strength and balance in order to improve ease with all functional mobility.    Personal Factors and Comorbidities Comorbidity 3+    Examination-Activity Limitations Bathing;Lift;Locomotion Level;Stand;Toileting;Carry;Stairs;Squat    Examination-Participation Restrictions Occupation;Driving;Community Activity    Stability/Clinical Decision Making Evolving/Moderate complexity    Rehab Potential Good    PT Frequency 2x / week    PT Duration 8 weeks    PT Treatment/Interventions ADLs/Self Care Home Management;Moist Heat;Gait training;Stair training;Functional mobility training;Therapeutic activities;Therapeutic exercise;Balance training;Neuromuscular re-education;Patient/family education;Passive range of motion;Visual/perceptual remediation/compensation    PT Next Visit Plan Advance balance and strengthening exercises, obstacle course    PT Home Exercise Plan issue next visit    Consulted and Agree with Plan of Care Patient;Family member/caregiver           Patient will benefit from skilled therapeutic intervention in order to improve the following deficits and impairments:  Abnormal gait,Decreased  balance,Decreased endurance,Difficulty walking,Impaired vision/preception,Decreased activity tolerance,Decreased coordination,Decreased strength,Impaired flexibility,Impaired UE functional use,Postural dysfunction,Pain,Impaired perceived functional ability,Decreased range of motion  Visit Diagnosis: Muscle weakness (generalized)  Other lack of coordination  Unsteadiness on feet  Abnormality of gait and mobility     Problem List Patient Active Problem List   Diagnosis Date Noted  . Cerebral thrombosis with cerebral infarction 04/29/2020  . Change in vision 04/27/2020  . Essential hypertension 04/27/2020  . Hyperlipidemia 04/27/2020  . Obesity (BMI 30-39.9) 04/27/2020  . Pre-diabetes 02/03/2020  . Cerebral hemorrhage (HCC) 02/02/2020  . Anemia 02/02/2020  . Disorder of bursae of shoulder region 02/02/2020  . Supraventricular tachycardia (HCC) 02/02/2020  . TIA (  transient ischemic attack) 02/02/2020  . 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 Osteoarthritis of left hip 11/21/2018    Temple Pacini PT, DPT  06/30/2020, 1:01 PM  [place] [facility] MAIN [facility] SERVICES [address] [place], [place], [address] Phone: [phone]   Fax:  [phone]  Name: Travis Chavez MRN: [id number] Date of Birth: Jun 18, 1960

## 2020-06-30 NOTE — Therapy (Signed)
[place] [facility] MAIN [facility] SERVICES [address] [place], [place], [address] Phone: [phone]   Fax:  [phone]  Occupational Therapy Treatment  Patient Details  Name: Travis Chavez MRN: [id number] Date of Birth: 1960-11-25 No data recorded  Encounter Date: 06/30/2020   OT End of Session - 06/30/20 0945    Visit Number 7    Number of Visits 24    Date for OT Re-Evaluation 08/14/20    Authorization Type progress report period  starting 05/22/2020    OT Start Time 0933    OT Stop Time 1015    OT Time Calculation (min) 42 min    Activity Tolerance Patient tolerated treatment well    Behavior During Therapy [facility] for tasks assessed/performed           Past Medical History:  Diagnosis Date  . Hypertension   . Stroke [facility])     History reviewed. No pertinent surgical history.  There were no vitals filed for this visit.   Subjective Assessment - 06/30/20 0944    Subjective  Pt. reports that his vision is improving.    Pertinent History Pt. is a 60 y.o. male who was admitted to [facility] with a TIA in 01/2020. Pt. was the admitted to [facility] on 04/27/2020. Pt.  was diagnosed with an acute infarct in the Right PCA territory, subacute left corpus collosum, and chronic infarcts in the left corona radiata, basal ganglia, and RIght cerrebellum. PMHx includes: HLD, HTN, GERD, Arrythmia, right CVA.    Patient Stated Goals To be independent as possible, "do as much as I can"    Currently in Pain? No/denies          OT TREATMENT    Therapeutic Exercise:  Pt. performed 2# dowel ex. For UE strengthening secondary to weakness. Bilateral shoulder flexion, chest press, circular patterns, and elbow flexion/extension were performed. 1# for forearm supination/pronation, wrist flexion/extension, and radial deviation. Pt. requires rest breaks and verbal cues for proper technique. Pt. Worked on performing right hand lateral pinch  strengthening with yellow, and red resistive clips for 10 reps each. Pt.worked on the [employer] dynamometer for 26#.  Pt. required hand verbal cues, and cues for visual demonstration of hand placement, hand patterns, and proper technique.  Pt. required reps of alternating weightbearing through the right hand.  Pt continues to work on improving RUE ROM, and bialteral UE strengthening in order to iimprove, and maximize independence with ADLs, and IADLs.                         OT Education - 06/30/20 0945    Education Details UE strengthening    Person(s) Educated Patient    Methods Explanation;Demonstration    Comprehension Verbalized understanding;Returned demonstration               OT Long Term Goals - 05/22/20 1456      OT LONG TERM GOAL #1   Title Pt. will improvie LUE strength by 2 mm grades to improve ADL, and IADL functioning.    Baseline Eval: Pt. presents with decreased LUE strength limiting ADL functioning.    Time 12    Period Weeks    Status New    Target Date 08/14/20      OT LONG TERM GOAL #2   Title Pt. will demonstrate visual compensatory strategies 100% of the time for navigating through his environment.    Baseline Eval:  pt. requires assist to navigate through his environment safely    Time 12    Period Weeks    Status New    Target Date 08/14/20      OT LONG TERM GOAL #3   Title Pt. will demonstrate visual compensatory strategies 100% of the time to be able to complete tabletip top tasks independently    Baseline Eval: pt. requires assist    Time 12    Period Weeks    Status New    Target Date 08/14/20      OT LONG TERM GOAL #4   Title Pt. will accurately probelm solve through safety awareness, and judgement questions for ADLs/IADLs with 100% accuracy.    Baseline Eval: TBD    Time 12    Period Weeks    Status New    Target Date 08/14/20      OT LONG TERM GOAL #5   Title Pt. will improve FOTO score by 2 points for  improved functional outcomes.    Baseline Eval: FOTO score: 72 (80 typical result)    Time 12    Period Weeks    Status New    Target Date 08/14/20                 Plan - 06/30/20 0946    Clinical Impression Statement Pt. required hand verbal cues, and cues for visual demonstration of hand placement, hand patterns, and proper technique.  Pt. required reps of alternating weightbearing through the right hand.  Pt continues to work on improving RUE ROM, and bialteral UE strengthening in order to iimprove, and maximize independence with ADLs, and IADLs.     OT Occupational Profile and History Problem Focused Assessment - Including review of records relating to presenting problem    Occupational performance deficits (Please refer to evaluation for details): ADL's;IADL's;Education    Rehab Potential Excellent    Clinical Decision Making Several treatment options, min-mod task modification necessary    Comorbidities Affecting Occupational Performance: May have comorbidities impacting occupational performance    Modification or Assistance to Complete Evaluation  Min-Moderate modification of tasks or assist with assess necessary to complete eval    OT Frequency 2x / week    OT Duration 12 weeks    OT Treatment/Interventions Self-care/ADL training;Therapeutic exercise;Neuromuscular education;Therapeutic activities;Patient/family education;DME and/or AE instruction;Energy conservation    Consulted and Agree with Plan of Care Patient           Patient will benefit from skilled therapeutic intervention in order to improve the following deficits and impairments:           Visit Diagnosis: Muscle weakness (generalized)    Problem List Patient Active Problem List   Diagnosis Date Noted  . Cerebral thrombosis with cerebral infarction 04/29/2020  . Change in vision 04/27/2020  . Essential hypertension 04/27/2020  . Hyperlipidemia 04/27/2020  . Obesity (BMI 30-39.9) 04/27/2020  .  Pre-diabetes 02/03/2020  . Cerebral hemorrhage (HCC) 02/02/2020  . Anemia 02/02/2020  . Disorder of bursae of shoulder region 02/02/2020  . Supraventricular tachycardia (HCC) 02/02/2020  . TIA (transient ischemic attack) 02/02/2020  . 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 Osteoarthritis of left hip 11/21/2018    Olegario Messier, MS, OTR/L 06/30/2020, 9:52 AM  [place] [facility] MAIN [facility] SERVICES [address] [place], [place], [address] Phone: [phone]   Fax:  [phone]  Name: Travis Chavez MRN: [id number] Date of Birth: 12/24/1960

## 2020-07-02 ENCOUNTER — Encounter: Payer: Self-pay | Admitting: Occupational Therapy

## 2020-07-02 ENCOUNTER — Ambulatory Visit: Payer: Medicaid Other

## 2020-07-02 ENCOUNTER — Other Ambulatory Visit: Payer: Self-pay

## 2020-07-02 ENCOUNTER — Ambulatory Visit: Payer: Medicaid Other | Admitting: Occupational Therapy

## 2020-07-02 DIAGNOSIS — R278 Other lack of coordination: Secondary | ICD-10-CM

## 2020-07-02 DIAGNOSIS — R269 Unspecified abnormalities of gait and mobility: Secondary | ICD-10-CM

## 2020-07-02 DIAGNOSIS — R2681 Unsteadiness on feet: Secondary | ICD-10-CM

## 2020-07-02 DIAGNOSIS — M6281 Muscle weakness (generalized): Secondary | ICD-10-CM

## 2020-07-02 NOTE — Therapy (Signed)
[facility] MAIN [facility] SERVICES [address] [place], [place], [address] Phone: [phone]   Fax:  [phone]  Occupational Therapy Treatment  Patient Details  Name: Travis Chavez MRN: [id number] Date of Birth: 1960-08-08 No data recorded  Encounter Date: 07/02/2020   OT End of Session - 07/02/20 0938    Visit Number 8    Number of Visits 24    Date for OT Re-Evaluation 08/14/20    Authorization Type progress report period  starting 05/22/2020    OT Start Time 0930    OT Stop Time 1015    OT Time Calculation (min) 45 min    Activity Tolerance Patient tolerated treatment well    Behavior During Therapy [facility] for tasks assessed/performed           Past Medical History:  Diagnosis Date  . Hypertension   . Stroke [facility])     History reviewed. No pertinent surgical history.  There were no vitals filed for this visit.   Subjective Assessment - 07/02/20 0937    Subjective  Pt. reports that he had a fall outside while trying to feed his dog.    Pertinent History Pt. is a 60 y.o. male who was admitted to [facility] with a TIA in 01/2020. Pt. was the admitted to [facility] on 04/27/2020. Pt.  was diagnosed with an acute infarct in the Right PCA territory, subacute left corpus collosum, and chronic infarcts in the left corona radiata, basal ganglia, and RIght cerrebellum. PMHx includes: HLD, HTN, GERD, Arrythmia, right CVA.    Currently in Pain? No/denies          OT TREATMENT    Therapeutic Exercise:  Pt. performed 2# dowel ex. For UE strengthening secondary to weakness. Bilateral shoulder flexion, chest press, circular patterns, and elbow flexion/extension were performed. 1# for forearm supination/pronation, wrist flexion/extension, and radial deviation.  Pt. worked on green thearputty ex. for hand strengthening. Exercises included: gross gripping, lateral, and 3pt. Pinch strengthening for multiple reps. Gross digit  extension, and thumb abduction for a few reps prior to fatiguing.  Pt. required hand verbal cues, and cues for visual demonstration of hand placement, hand patterns, and proper technique with the UE exercises, and the theraputty.   Pt. Required rest breaks. Pt. required reps of alternating weightbearing through the right hand. Pt. Has clonus in the right hand when pt. Fatigues. Pt continues to work on improving RUE ROM, and bialteral UE strengthening in order to iimprove, and maximize independence with ADLs, and IADLs.                         OT Education - 07/02/20 [phone]    Education Details UE strengthening    Person(s) Educated Patient    Methods Explanation;Demonstration    Comprehension Verbalized understanding;Returned demonstration               OT Long Term Goals - 05/22/20 1456      OT LONG TERM GOAL #1   Title Pt. will improvie LUE strength by 2 mm grades to improve ADL, and IADL functioning.    Baseline Eval: Pt. presents with decreased LUE strength limiting ADL functioning.    Time 12    Period Weeks    Status New    Target Date 08/14/20      OT LONG TERM GOAL #2   Title Pt. will demonstrate visual compensatory strategies 100% of the time for navigating  through his environment.    Baseline Eval:  pt. requires assist to navigate through his environment safely    Time 12    Period Weeks    Status New    Target Date 08/14/20      OT LONG TERM GOAL #3   Title Pt. will demonstrate visual compensatory strategies 100% of the time to be able to complete tabletip top tasks independently    Baseline Eval: pt. requires assist    Time 12    Period Weeks    Status New    Target Date 08/14/20      OT LONG TERM GOAL #4   Title Pt. will accurately probelm solve through safety awareness, and judgement questions for ADLs/IADLs with 100% accuracy.    Baseline Eval: TBD    Time 12    Period Weeks    Status New    Target Date 08/14/20      OT LONG TERM  GOAL #5   Title Pt. will improve FOTO score by 2 points for improved functional outcomes.    Baseline Eval: FOTO score: 72 (80 typical result)    Time 12    Period Weeks    Status New    Target Date 08/14/20                 Plan - 07/02/20 0762    Clinical Impression Statement Pt. required hand verbal cues, and cues for visual demonstration of hand placement, hand patterns, and proper technique with the UE exercises, and the theraputty.   Pt. Required rest breaks. Pt. required reps of alternating weightbearing through the right hand. Pt. Has clonus in the right hand when pt. Fatigues. Pt continues to work on improving RUE ROM, and bialteral UE strengthening in order to iimprove, and maximize independence with ADLs, and IADLs.   OT Occupational Profile and History Problem Focused Assessment - Including review of records relating to presenting problem    Occupational performance deficits (Please refer to evaluation for details): ADL's;IADL's;Education    Rehab Potential Excellent    Clinical Decision Making Several treatment options, min-mod task modification necessary    Comorbidities Affecting Occupational Performance: May have comorbidities impacting occupational performance    Modification or Assistance to Complete Evaluation  Min-Moderate modification of tasks or assist with assess necessary to complete eval    OT Frequency 2x / week    OT Duration 12 weeks    OT Treatment/Interventions Self-care/ADL training;Therapeutic exercise;Neuromuscular education;Therapeutic activities;Patient/family education;DME and/or AE instruction;Energy conservation    Consulted and Agree with Plan of Care Patient           Patient will benefit from skilled therapeutic intervention in order to improve the following deficits and impairments:           Visit Diagnosis: Muscle weakness (generalized)  Other lack of coordination    Problem List Patient Active Problem List   Diagnosis Date  Noted  . Cerebral thrombosis with cerebral infarction 04/29/2020  . Change in vision 04/27/2020  . Essential hypertension 04/27/2020  . Hyperlipidemia 04/27/2020  . Obesity (BMI 30-39.9) 04/27/2020  . Pre-diabetes 02/03/2020  . Cerebral hemorrhage (HCC) 02/02/2020  . Anemia 02/02/2020  . Disorder of bursae of shoulder region 02/02/2020  . Supraventricular tachycardia (HCC) 02/02/2020  . TIA (transient ischemic attack) 02/02/2020  . 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 Osteoarthritis of left hip 11/21/2018    Olegario Messier, MS, OTR/L 07/02/2020, 9:41 AM  [place]  [facility] MAIN [facility] SERVICES [address] [place], [place], [address] Phone: [phone]   Fax:  [phone]  Name: FIN HUPP MRN: [id number] Date of Birth: 1961/04/26

## 2020-07-02 NOTE — Therapy (Signed)
[place] [facility] MAIN [facility] SERVICES [address] [place], [place], [address] Phone: [phone]   Fax:  [phone]  Physical Therapy Treatment  Patient Details  Name: Travis Chavez MRN: [id number] Date of Birth: 10-27-1960 Referring Provider (PT): Delia Heady   Encounter Date: 07/02/2020   PT End of Session - 07/02/20 1110    Visit Number 8    Number of Visits 17    Date for PT Re-Evaluation 07/15/20    PT Start Time 1016    PT Stop Time 1100    PT Time Calculation (min) 44 min    Equipment Utilized During Treatment Gait belt    Activity Tolerance Patient tolerated treatment well    Behavior During Therapy [facility] for tasks assessed/performed           Past Medical History:  Diagnosis Date  . Hypertension   . Stroke [facility])     History reviewed. No pertinent surgical history.  There were no vitals filed for this visit.   Subjective Assessment - 07/02/20 1003    Subjective Pt reports he went outside to feed his dog yesterday and when he went to pick up his dog's bowl his feet slipped out in front of him and he fell. Pt reports he did not injure himself but states it "hurt my pride."  Pt reports no pain today. Pt reports HEP is going well.    Patient is accompained by: Family member    How long can you sit comfortably? pt reports he can sit all day without problems    How long can you stand comfortably? 30 mins    How long can you walk comfortably? pt reports he can stand all day without problems    Patient Stated Goals improve overall function    Currently in Pain? No/denies    Pain Onset 1 to 4 weeks ago          Treatment   Neuro re-ed:  Obstacle course navigating around cones and stepping onto and over objects/compliant surfaces - 4x, CGA. Pt with one instance of LOB to L side and regained balance with step strategy and min a from PT. VC for foot clearance. -airex pad forward/backward step ups - x multiple repetitions B, with  unilateral UE support, CGA, VC for foot clearance and larger steps of R LE -airex pad lateral step-ups both sides - x multiple reps B, VC for increased R LE foot clearance/step length, UUE support Baps board forward/backward/laterally - x multiple repetitions, BUE support, CGA Korebalance penguin race 3x, CGA B UE support Korebalance maze  2x, CGA, B UE support  Therapeutic Exercise:  Seated LAQ with 3# AW, 2x15 B LEs, pt rates "medium"  Assessment: PT session focused on training static/dynamic balance and ankle strategies. Pt during obstacle course exhibited one instance of LOB with stepping onto compliant surface, and required min a and stepping strategy to regain balance. Pt required VC throughout to increase step length and foot clearance on R LE. Pt will continue to benefit from further skilled therapy to improve balance, gait, and B LE strength in order to decrease risk of falls.   PT Education - 07/02/20 1109    Education Details Pt educated on body mechanics, baps board exercise technique    Person(s) Educated Patient    Methods Explanation;Demonstration;Verbal cues    Comprehension Verbalized understanding;Returned demonstration            PT Short Term Goals - 06/02/20 1603  PT SHORT TERM GOAL #1   Title Patient will be independent in home exercise program to improve strength/mobility for better functional independence with ADLs.    Status New    Target Date 07/15/20             PT Long Term Goals - 06/02/20 1603      PT LONG TERM GOAL #1   Title Patient will increase FOTO score to equal to or greater than 78% to demonstrate statistically significant improvement in mobility and quality of life.    Baseline 64%    Status New      PT LONG TERM GOAL #2   Title Patient (60 years old) will complete five times sit to stand test in < 10 seconds indicating an increased LE strength and improved balance.    Baseline 16.32 seconds    Status New      PT LONG TERM  GOAL #3   Title Patient will increase 10 meter walk test to >1.46m/s as to improve gait speed for better community ambulation and to reduce fall risk.    Baseline 1.5 m/s    Status New      PT LONG TERM GOAL #4   Title Patient will increase Functional Gait Assessment score to >20/30 as to reduce fall risk and improve dynamic gait safety with community ambulation    Status New                 Plan - 07/02/20 1121    Clinical Impression Statement PT session focused on training static/dynamic balance and ankle strategies. Pt during obstacle course exhibited one instance of LOB with stepping onto compliant surface, and required min a and stepping strategy to regain balance. Pt required VC throughout to increase step length and foot clearance on R LE. Pt will continue to benefit from further skilled therapy to improve balance, gait, and B LE strength in order to decrease risk of falls.    Personal Factors and Comorbidities Comorbidity 3+    Examination-Activity Limitations Bathing;Lift;Locomotion Level;Stand;Toileting;Carry;Stairs;Squat    Examination-Participation Restrictions Occupation;Driving;Community Activity    Stability/Clinical Decision Making Evolving/Moderate complexity    Rehab Potential Good    PT Frequency 2x / week    PT Duration 8 weeks    PT Treatment/Interventions ADLs/Self Care Home Management;Moist Heat;Gait training;Stair training;Functional mobility training;Therapeutic activities;Therapeutic exercise;Balance training;Neuromuscular re-education;Patient/family education;Passive range of motion;Visual/perceptual remediation/compensation    PT Next Visit Plan Advance balance and strengthening exercises, obstacle course, reactive balance training    PT Home Exercise Plan issue next visit    Consulted and Agree with Plan of Care Patient;Family member/caregiver           Patient will benefit from skilled therapeutic intervention in order to improve the following deficits  and impairments:  Abnormal gait,Decreased balance,Decreased endurance,Difficulty walking,Impaired vision/preception,Decreased activity tolerance,Decreased coordination,Decreased strength,Impaired flexibility,Impaired UE functional use,Postural dysfunction,Pain,Impaired perceived functional ability,Decreased range of motion  Visit Diagnosis: Unsteadiness on feet  Muscle weakness (generalized)  Other lack of coordination  Abnormality of gait and mobility     Problem List Patient Active Problem List   Diagnosis Date Noted  . Cerebral thrombosis with cerebral infarction 04/29/2020  . Change in vision 04/27/2020  . Essential hypertension 04/27/2020  . Hyperlipidemia 04/27/2020  . Obesity (BMI 30-39.9) 04/27/2020  . Pre-diabetes 02/03/2020  . Cerebral hemorrhage (HCC) 02/02/2020  . Anemia 02/02/2020  . Disorder of bursae of shoulder region 02/02/2020  . Supraventricular tachycardia (HCC) 02/02/2020  . TIA (transient ischemic attack) 02/02/2020  .  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 Osteoarthritis of left hip 11/21/2018   Temple Pacini PT, DPT  Baird Kay 07/02/2020, 11:23 AM  [place] [facility] MAIN [facility] SERVICES [address] [place], [place], [address] Phone: [phone]   Fax:  [phone]  Name: HESHAM WOMAC MRN: [id number] Date of Birth: 1961-04-19

## 2020-07-07 ENCOUNTER — Encounter: Payer: Self-pay | Admitting: Occupational Therapy

## 2020-07-07 ENCOUNTER — Other Ambulatory Visit: Payer: Self-pay

## 2020-07-07 ENCOUNTER — Ambulatory Visit: Payer: Medicaid Other

## 2020-07-07 ENCOUNTER — Ambulatory Visit: Payer: Medicaid Other | Admitting: Occupational Therapy

## 2020-07-07 DIAGNOSIS — R278 Other lack of coordination: Secondary | ICD-10-CM

## 2020-07-07 DIAGNOSIS — R2681 Unsteadiness on feet: Secondary | ICD-10-CM

## 2020-07-07 DIAGNOSIS — M6281 Muscle weakness (generalized): Secondary | ICD-10-CM | POA: Diagnosis not present

## 2020-07-07 DIAGNOSIS — R269 Unspecified abnormalities of gait and mobility: Secondary | ICD-10-CM

## 2020-07-07 NOTE — Therapy (Signed)
[facility] MAIN [facility] SERVICES [address] [place], [place], [address] Phone: [phone]   Fax:  [phone]  Occupational Therapy Treatment  Patient Details  Name: Travis Chavez MRN: [id number] Date of Birth: 03/17/1961 No data recorded  Encounter Date: 07/07/2020   OT End of Session - 07/07/20 0953    Visit Number 9    Number of Visits 24    Date for OT Re-Evaluation 08/14/20    Authorization Type progress report period  starting 05/22/2020    OT Start Time 0935    OT Stop Time 1015    OT Time Calculation (min) 40 min    Activity Tolerance Patient tolerated treatment well    Behavior During Therapy [facility] for tasks assessed/performed           Past Medical History:  Diagnosis Date  . Hypertension   . Stroke [facility])     History reviewed. No pertinent surgical history.  There were no vitals filed for this visit.   Subjective Assessment - 07/07/20 0952    Subjective  "Everything I try to do I do it with my right hand first but I often need help and end up using left hand."    Pertinent History Pt. is a 60 y.o. male who was admitted to [facility] with a TIA in 01/2020. Pt. was the admitted to [facility] on 04/27/2020. Pt.  was diagnosed with an acute infarct in the Right PCA territory, subacute left corpus collosum, and chronic infarcts in the left corona radiata, basal ganglia, and RIght cerrebellum. PMHx includes: HLD, HTN, GERD, Arrythmia, right CVA.    Patient Stated Goals To be independent as possible, "do as much as I can"    Currently in Pain? No/denies    Pain Score 0-No pain          Pt seen for upper body strengthening with use of UBE from seated position, resistance of 2.5 to 3.0 for 6 mins, forwards/backwards, alternating levels of resistance and therapist in constant attendance to ensure grip and to adjust settings.  Resistive pinch with varying levels of resistance from yellow to black to place onto  dowels presented in a various planes of motion to encourage reach.   Resistive hand gripper for sustained gripping patterns, 17#, cues for proper hand placement on gripper.  Cues for right elbow to keep it down and decrease compensatory movements at the shoulder.    Response to tx: Patient progressing well with strength, ROM and coordination tasks, reports vision has improved over the last few weeks.  Patient responds well to cues for tasks and therapist demonstration.  Cues to use left hand when initiating tasks. Encouraged patient to work towards using the left hand more at home during daily activities to improve functional use.  Continue towards goals in plan of care to maximize safety and independence in ADL and IADL tasks.                         OT Education - 07/07/20 0953    Education Details UE strengthening    Person(s) Educated Patient    Methods Explanation;Demonstration    Comprehension Verbalized understanding;Returned demonstration               OT Long Term Goals - 05/22/20 1456      OT LONG TERM GOAL #1   Title Pt. will improvie LUE strength by 2 mm grades to improve ADL, and  IADL functioning.    Baseline Eval: Pt. presents with decreased LUE strength limiting ADL functioning.    Time 12    Period Weeks    Status New    Target Date 08/14/20      OT LONG TERM GOAL #2   Title Pt. will demonstrate visual compensatory strategies 100% of the time for navigating through his environment.    Baseline Eval:  pt. requires assist to navigate through his environment safely    Time 12    Period Weeks    Status New    Target Date 08/14/20      OT LONG TERM GOAL #3   Title Pt. will demonstrate visual compensatory strategies 100% of the time to be able to complete tabletip top tasks independently    Baseline Eval: pt. requires assist    Time 12    Period Weeks    Status New    Target Date 08/14/20      OT LONG TERM GOAL #4   Title Pt. will  accurately probelm solve through safety awareness, and judgement questions for ADLs/IADLs with 100% accuracy.    Baseline Eval: TBD    Time 12    Period Weeks    Status New    Target Date 08/14/20      OT LONG TERM GOAL #5   Title Pt. will improve FOTO score by 2 points for improved functional outcomes.    Baseline Eval: FOTO score: 72 (80 typical result)    Time 12    Period Weeks    Status New    Target Date 08/14/20                 Plan - 07/07/20 0954    OT Occupational Profile and History Problem Focused Assessment - Including review of records relating to presenting problem    Occupational performance deficits (Please refer to evaluation for details): ADL's;IADL's;Education    Rehab Potential Excellent    Clinical Decision Making Several treatment options, min-mod task modification necessary    Comorbidities Affecting Occupational Performance: May have comorbidities impacting occupational performance    Modification or Assistance to Complete Evaluation  Min-Moderate modification of tasks or assist with assess necessary to complete eval    OT Frequency 2x / week    OT Duration 12 weeks    OT Treatment/Interventions Self-care/ADL training;Therapeutic exercise;Neuromuscular education;Therapeutic activities;Patient/family education;DME and/or AE instruction;Energy conservation    Consulted and Agree with Plan of Care Patient           Patient will benefit from skilled therapeutic intervention in order to improve the following deficits and impairments:           Visit Diagnosis: Muscle weakness (generalized)  Other lack of coordination  Unsteadiness on feet    Problem List Patient Active Problem List   Diagnosis Date Noted  . Cerebral thrombosis with cerebral infarction 04/29/2020  . Change in vision 04/27/2020  . Essential hypertension 04/27/2020  . Hyperlipidemia 04/27/2020  . Obesity (BMI 30-39.9) 04/27/2020  . Pre-diabetes 02/03/2020  . Cerebral  hemorrhage (HCC) 02/02/2020  . Anemia 02/02/2020  . Disorder of bursae of shoulder region 02/02/2020  . Supraventricular tachycardia (HCC) 02/02/2020  . TIA (transient ischemic attack) 02/02/2020  . 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 Osteoarthritis of left hip 11/21/2018   Bhavik Cabiness T Deaveon Schoen, OTR/L, CLT  Gabryella Murfin 07/07/2020, 8:21 PM  [place] [facility] MAIN [facility] SERVICES [address]  [place], [place], [address] Phone: [phone]   Fax:  [phone]  Name: Travis Chavez MRN: [id number] Date of Birth: 06-15-1960

## 2020-07-07 NOTE — Therapy (Signed)
[place] [facility] MAIN [facility] SERVICES [address] [place], [place], [address] Phone: [phone]   Fax:  [phone]  Physical Therapy Treatment  Patient Details  Name: Travis Chavez MRN: [id number] Date of Birth: October 04, 1960 Referring Provider (PT): Delia Heady   Encounter Date: 07/07/2020   PT End of Session - 07/07/20 1315    Visit Number 9    Number of Visits 17    Date for PT Re-Evaluation 07/15/20    PT Start Time 1017    PT Stop Time 1059    PT Time Calculation (min) 42 min    Equipment Utilized During Treatment Gait belt    Activity Tolerance Patient tolerated treatment well    Behavior During Therapy [facility] for tasks assessed/performed           Past Medical History:  Diagnosis Date  . Hypertension   . Stroke [facility])     History reviewed. No pertinent surgical history.  There were no vitals filed for this visit.   Subjective Assessment - 07/07/20 1314    Subjective Pt reports no pain but reports soreness in B quads. Pt reports HEP going ok. Pt spouse present and says pt has been going up and down the stairs independently using one handrail.    Patient is accompained by: Family member    How long can you sit comfortably? pt reports he can sit all day without problems    How long can you stand comfortably? 30 mins    How long can you walk comfortably? pt reports he can stand all day without problems    Patient Stated Goals improve overall function    Currently in Pain? No/denies    Pain Onset 1 to 4 weeks ago          Therapeutic Exercise:  STS 1x12, pt reports exercise is "medium" Staggered STS 1x5 B LEs pt reports soreness with L LE and rates exercise "medium"; pt with more difficulty on R LE  Neuromuscular Re-education:   Obstacle course with rail stepping onto and over objects with intermittent UE assist, CGA - x multiple repetitions  Stepping over hurdle forward/backward x multiple reps; pt greatest difficulty  clearing R LE. Stairs 4x, CGA; pt with reciprocal gait ascending and step-to gait descending  SLB - unable to perform this session, pt reports B LEs fatigued and sore Prolonged seated rest break secondary to fatigue Ambulation horizontal and vertical head turns -6x each; pt greater difficulty with horizontal head turns demonstrating decreased step length and speed, and variability in BOS.  Assessment: Pt able to perform staggered STS this session on B LEs and rates them "medium," indicating increased LE strength compared to previous sessions. However, pt later in session unable to perform SLB due to reports of B LE fatigue following therex and soreness in B quads. Pt with increased variability in BOS, decreased step length and speed when ambulating with horizontal head turns > vertical head turns. Pt will benefit from further skilled therapy to strengthen B LEs, and improve dynamic and static balance.          PT Education - 07/07/20 1315    Education Details Pt educated on foot clearance techniques over obstacles    Person(s) Educated Patient    Methods Explanation;Demonstration;Verbal cues    Comprehension Verbalized understanding;Returned demonstration            PT Short Term Goals - 06/02/20 1603      PT SHORT TERM GOAL #1  Title Patient will be independent in home exercise program to improve strength/mobility for better functional independence with ADLs.    Status New    Target Date 07/15/20             PT Long Term Goals - 06/02/20 1603      PT LONG TERM GOAL #1   Title Patient will increase FOTO score to equal to or greater than 78% to demonstrate statistically significant improvement in mobility and quality of life.    Baseline 64%    Status New      PT LONG TERM GOAL #2   Title Patient (< 24 years old) will complete five times sit to stand test in < 10 seconds indicating an increased LE strength and improved balance.    Baseline 16.32 seconds    Status New       PT LONG TERM GOAL #3   Title Patient will increase 10 meter walk test to >1.39m/s as to improve gait speed for better community ambulation and to reduce fall risk.    Baseline 1.5 m/s    Status New      PT LONG TERM GOAL #4   Title Patient will increase Functional Gait Assessment score to >20/30 as to reduce fall risk and improve dynamic gait safety with community ambulation    Status New                 Plan - 07/07/20 1321    Clinical Impression Statement Pt able to perform staggered STS this session on B LEs and rates them "medium," indicating increased LE strength compared to previous sessions. However, pt later in session unable to perform SLB due to reports of B LE fatigue following therex and soreness in B quads. Pt with increased variability in BOS, decreased step length and speed when ambulating with horizontal head turns > vertical head turns. Pt will benefit from further skilled therapy to strengthen B LEs, and improve dynamic and static balance.    Personal Factors and Comorbidities Comorbidity 3+    Examination-Activity Limitations Bathing;Lift;Locomotion Level;Stand;Toileting;Carry;Stairs;Squat    Examination-Participation Restrictions Occupation;Driving;Community Activity    Stability/Clinical Decision Making Evolving/Moderate complexity    Rehab Potential Good    PT Frequency 2x / week    PT Duration 8 weeks    PT Treatment/Interventions ADLs/Self Care Home Management;Moist Heat;Gait training;Stair training;Functional mobility training;Therapeutic activities;Therapeutic exercise;Balance training;Neuromuscular re-education;Patient/family education;Passive range of motion;Visual/perceptual remediation/compensation    PT Next Visit Plan Progress/goal assessment    PT Home Exercise Plan issue next visit    Consulted and Agree with Plan of Care Patient;Family member/caregiver           Patient will benefit from skilled therapeutic intervention in order to improve the  following deficits and impairments:  Abnormal gait,Decreased balance,Decreased endurance,Difficulty walking,Impaired vision/preception,Decreased activity tolerance,Decreased coordination,Decreased strength,Impaired flexibility,Impaired UE functional use,Postural dysfunction,Pain,Impaired perceived functional ability,Decreased range of motion  Visit Diagnosis: Muscle weakness (generalized)  Other lack of coordination  Unsteadiness on feet  Abnormality of gait and mobility     Problem List Patient Active Problem List   Diagnosis Date Noted  . Cerebral thrombosis with cerebral infarction 04/29/2020  . Change in vision 04/27/2020  . Essential hypertension 04/27/2020  . Hyperlipidemia 04/27/2020  . Obesity (BMI 30-39.9) 04/27/2020  . Pre-diabetes 02/03/2020  . Cerebral hemorrhage (HCC) 02/02/2020  . Anemia 02/02/2020  . Disorder of bursae of shoulder region 02/02/2020  . Supraventricular tachycardia (HCC) 02/02/2020  . TIA (transient ischemic attack) 02/02/2020  . History  of revision of total replacement of left hip joint 11/30/2018  . Osteoarthritis of left hip 11/21/2018   Temple Pacini PT, DPT  07/07/2020, 1:23 PM  [place] [facility] MAIN [facility] SERVICES [address] [place], [place], [address] Phone: [phone]   Fax:  [phone]  Name: Travis Chavez MRN: [id number] Date of Birth: [birth year]

## 2020-07-09 ENCOUNTER — Other Ambulatory Visit: Payer: Self-pay

## 2020-07-09 ENCOUNTER — Ambulatory Visit: Payer: Medicaid Other | Attending: Neurology | Admitting: Occupational Therapy

## 2020-07-09 ENCOUNTER — Ambulatory Visit: Payer: Medicaid Other

## 2020-07-09 ENCOUNTER — Encounter: Payer: Self-pay | Admitting: Occupational Therapy

## 2020-07-09 DIAGNOSIS — R2681 Unsteadiness on feet: Secondary | ICD-10-CM | POA: Diagnosis present

## 2020-07-09 DIAGNOSIS — H543 Unqualified visual loss, both eyes: Secondary | ICD-10-CM | POA: Diagnosis present

## 2020-07-09 DIAGNOSIS — M6281 Muscle weakness (generalized): Secondary | ICD-10-CM | POA: Insufficient documentation

## 2020-07-09 DIAGNOSIS — R269 Unspecified abnormalities of gait and mobility: Secondary | ICD-10-CM | POA: Diagnosis present

## 2020-07-09 DIAGNOSIS — R278 Other lack of coordination: Secondary | ICD-10-CM | POA: Insufficient documentation

## 2020-07-09 NOTE — Therapy (Signed)
[place] [facility] MAIN [facility] SERVICES [address] [place], [place], [address] Phone: [phone]   Fax:  [phone]  Occupational Therapy Progress Note  Dates of reporting period  05/22/2020   to   07/09/2020  Patient Details  Name: Travis Chavez MRN: [id number] Date of Birth: 01/21/1961 No data recorded  Encounter Date: 07/09/2020   OT End of Session - 07/09/20 0937    Visit Number 10    Number of Visits 24    Date for OT Re-Evaluation 08/14/20    Authorization Type progress report period  starting 05/22/2020    OT Start Time 0934    OT Stop Time 1015    OT Time Calculation (min) 41 min    Activity Tolerance Patient tolerated treatment well    Behavior During Therapy [facility] for tasks assessed/performed           Past Medical History:  Diagnosis Date  . Hypertension   . Stroke [facility])     History reviewed. No pertinent surgical history.  There were no vitals filed for this visit.   Subjective Assessment - 07/09/20 0936    Subjective  Pt. reports that he is trying to use his right hand for as much as he ca.    Pertinent History Pt. is a 60 y.o. male who was admitted to [facility] with a TIA in 01/2020. Pt. was the admitted to [facility] on 04/27/2020. Pt.  was diagnosed with an acute infarct in the Right PCA territory, subacute left corpus collosum, and chronic infarcts in the left corona radiata, basal ganglia, and RIght cerrebellum. PMHx includes: HLD, HTN, GERD, Arrythmia, right CVA.              [facility] OT Assessment - 07/09/20 0001      Coordination   Right 9 Hole Peg Test 2 min & 47   Pt. able to place 9 pegs & remove them. Pt required assist with the left hand to turn the pegs for 6/9 pegs.     Strength   Overall Strength Comments BUE strength: 4/5      Hand Function   Right Hand Grip (lbs) 43    Right Hand Lateral Pinch 16 lbs    Right Hand 3 Point Pinch 9 lbs   With cues for digit position          Measurements were obtained, and goals were reviewed with the pt.  Pt. Has progressed with BUE strength, right grip strength, pinch strength, and [facility] skills.  Pt. Is able to demonstrate visual compensatory strategies for ADLs, and IADL tasks. Pt. has made steady progress, and is now engaging his RUE more during tasks at home. Pt. was able to tolerate BUE exercises with the 2.5# dowel for 1 set 20 reps each.  Pt. Continues to work on improving RUE strength, motor control, and [facility] skills in order to work towards improving, and maximizing independence with ADLs, and IADL tasks.                  OT Education - 07/09/20 [phone]    Education Details UE strengthening    Person(s) Educated Patient    Methods Explanation;Demonstration    Comprehension Verbalized understanding;Returned demonstration               OT Long Term Goals - 07/09/20 0955      OT LONG TERM GOAL #1   Title Pt. will improvie LUE strength by 2 mm grades  to improve ADL, and IADL functioning.    Baseline Eval: Pt. presents with decreased LUE strength limiting ADL functioning.    Time 12    Period Weeks    Status On-going    Target Date 08/14/20      OT LONG TERM GOAL #2   Title Pt. will demonstrate visual compensatory strategies 100% of the time for navigating through his environment.    Baseline 07/09/2020: P.t is able to use visual compensatory strategies, and is now navigating through his environment. Eval:  pt. requires assist to navigate through his environment safely    Time 12    Period Weeks    Status Achieved      OT LONG TERM GOAL #3   Title Pt. will demonstrate visual compensatory strategies 100% of the time to be able to complete tabletop top tasks independently    Baseline 07/09/2020:  Pt. is utilizing visual compensatory strategies  for tabletop taks. Eval: pt. requires assist    Time 12    Period Weeks    Status Achieved      OT LONG TERM GOAL #4   Title Pt. will accurately probelm solve  through safety awareness, and judgement questions for ADLs/IADLs with 100% accuracy.    Baseline 07/09/2020: Met    Time 12    Period Weeks    Status Achieved      OT LONG TERM GOAL #5   Title Pt. will improve FOTO score by 2 points for improved functional outcomes.    Baseline 07/09/2020: SCore: 63  Eval: FOTO score 72    Time 12    Period Weeks    Status On-going    Target Date 08/14/20                 Plan - 07/09/20 9381    Clinical Impression Statement Measurements were obtained, and goals were reviewed with the pt.  Pt. Has progressed with BUE strength, right grip strength, pinch strength, and [place] skills.  Pt. Is able to demonstrate visual compensatory strategies for ADLs, and IADL tasks. Pt. has made steady progress, and is now engaging his RUE more during tasks at home. Pt. was able to tolerate BUE exercises with the 2.5# dowel for 1 set 20 reps each.  Pt. Continues to work on improving RUE strength, motor control, and [facility] skills in order to work towards improving, and maximizing independence with ADLs, and IADL tasks.   OT Occupational Profile and History Problem Focused Assessment - Including review of records relating to presenting problem    Occupational performance deficits (Please refer to evaluation for details): ADL's;IADL's;Education    Rehab Potential Excellent    Clinical Decision Making Several treatment options, min-mod task modification necessary    Comorbidities Affecting Occupational Performance: May have comorbidities impacting occupational performance    Modification or Assistance to Complete Evaluation  Min-Moderate modification of tasks or assist with assess necessary to complete eval    OT Frequency 2x / week    OT Duration 12 weeks    OT Treatment/Interventions Self-care/ADL training;Therapeutic exercise;Neuromuscular education;Therapeutic activities;Patient/family education;DME and/or AE instruction;Energy conservation    Consulted and Agree with Plan of  Care Patient           Patient will benefit from skilled therapeutic intervention in order to improve the following deficits and impairments:           Visit Diagnosis: Muscle weakness (generalized)  Other lack of coordination    Problem List Patient Active Problem List  Diagnosis Date Noted  . Cerebral thrombosis with cerebral infarction 04/29/2020  . Change in vision 04/27/2020  . Essential hypertension 04/27/2020  . Hyperlipidemia 04/27/2020  . Obesity (BMI 30-39.9) 04/27/2020  . Pre-diabetes 02/03/2020  . Cerebral hemorrhage ([place]) 02/02/2020  . Anemia 02/02/2020  . Disorder of bursae of shoulder region 02/02/2020  . Supraventricular tachycardia ([place]) 02/02/2020  . TIA (transient ischemic attack) 02/02/2020  . 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 Osteoarthritis of left hip 11/21/2018    Harrel Carina, MS, OTR/L 07/09/2020, 10:08 AM  [place] MAIN [facility] SERVICES [address] [place], [place], [address] Phone: [phone]   Fax:  [phone]  Name: Travis Chavez MRN: [id number] Date of Birth: [birth year]

## 2020-07-09 NOTE — Therapy (Signed)
[facility] MAIN [facility] SERVICES [address] [place], [place], [address] Phone: [phone]   Fax:  [phone]  Physical Therapy Treatment/ Physical Therapy Progress Note   Dates of reporting period  05/20/2020  to   07/09/2020   Patient Details  Name: Travis Chavez MRN: [id number] Date of Birth: 07-27-1960 Referring Provider (PT): Delia Heady   Encounter Date: 07/09/2020   PT End of Session - 07/09/20 1213    Visit Number 10    Number of Visits 17    Date for PT Re-Evaluation 07/15/20    PT Start Time 1016    PT Stop Time 1100    PT Time Calculation (min) 44 min    Equipment Utilized During Treatment Gait belt    Activity Tolerance Patient tolerated treatment well    Behavior During Therapy WFL for tasks assessed/performed           Past Medical History:  Diagnosis Date  . Hypertension   . Stroke [facility])     History reviewed. No pertinent surgical history.  There were no vitals filed for this visit.   Subjective Assessment - 07/09/20 0930    Subjective Pt reports no pain today. Pt reports he is sleepy    Patient is accompained by: Family member    How long can you sit comfortably? pt reports he can sit all day without problems    How long can you stand comfortably? 30 mins    How long can you walk comfortably? pt reports he can stand all day without problems    Patient Stated Goals improve overall function    Currently in Pain? No/denies    Pain Onset 1 to 4 weeks ago          Treatment:  FOTO: 85%  Therapeutic Exercise: 5xSTS: 7.2 sec Seated rest break : 1.11 m/s no AD  Neuro Re-ed: FGA: 28/30 Seated rest break  Issued new HEP handout (see below): Education provided to pt on new HEP and on reassessment findings and POC.   Access Code: [email or web address] URL: [email or web address] Date: 07/09/2020 Prepared by: Temple Pacini  Exercises Sit to Stand - 1 x daily - 4 x weekly - 3 sets - 10  reps Forward Step Down with Heel Tap and Counter Support - 1 x daily - 4 x weekly - 3 sets - 10 reps Seated Hip Abduction with Resistance - 1 x daily - 4 x weekly - 3 sets - 10 reps Seated Hamstring Curls with Resistance - 1 x daily - 4 x weekly - 3 sets - 10 reps Sitting Knee Extension with Resistance - 1 x daily - 4 x weekly - 3 sets - 10 reps    Patient's condition has the potential to improve in response to therapy. Maximum improvement is yet to be obtained. The anticipated improvement is attainable and reasonable in a generally predictable time.        PT Education - 07/09/20 1314    Education Details Pt educated on new HEP    Person(s) Educated Patient    Methods Explanation;Handout    Comprehension Verbalized understanding;Returned demonstration            PT Short Term Goals - 07/09/20 1019      PT SHORT TERM GOAL #1   Title Patient will be independent in home exercise program to improve strength/mobility for better functional independence with ADLs.    Baseline 07/09/2020    Status Achieved    Target Date  07/15/20             PT Long Term Goals - 07/09/20 1021      PT LONG TERM GOAL #1   Title Patient will increase FOTO score to equal to or greater than 78% to demonstrate statistically significant improvement in mobility and quality of life.    Baseline 64%; 07/09/2020 85%    Status Achieved      PT LONG TERM GOAL #2   Title Patient (60 years old) will complete five times sit to stand test in < 10 seconds indicating an increased LE strength and improved balance.    Baseline 16.32 seconds; 07/09/2020 7.2 seconds    Status Achieved      PT LONG TERM GOAL #3   Title Patient will increase 10 meter walk test to >1.74m/s as to improve gait speed for better community ambulation and to reduce fall risk.    Baseline 1.5 m/s; 07/09/2020 1.11 m/s no AD    Status Achieved      PT LONG TERM GOAL #4   Title Patient will increase Functional Gait Assessment score to >20/30 as to  reduce fall risk and improve dynamic gait safety with community ambulation    Baseline 28/30    Status Achieved                 Plan - 07/09/20 1214    Clinical Impression Statement Pt has acheived all therapy goals: FOTO score 85%; 5xSTS 7.2 sec; 1.11 m/s; FGA 28/30 and pt independent with HEP. On FGA pt had minor difficulty with gait with vertical head turns and gait with eyes closed. Otherwise, pt shows overall functional improvement in strength and balance. Pt to have one more follow-up appointment in approximately two week after pt has eye appointment as pt is concerned how this affects his balance. Follow-up will also serve to confirm pt confident with new HEP issued today to maintain gains beyond therapy and that pt has not had a decrease in function.    Personal Factors and Comorbidities Comorbidity 3+    Examination-Activity Limitations Bathing;Lift;Locomotion Level;Stand;Toileting;Carry;Stairs;Squat    Examination-Participation Restrictions Occupation;Driving;Community Activity    Stability/Clinical Decision Making Evolving/Moderate complexity    Rehab Potential Good    PT Frequency 2x / week    PT Duration 8 weeks    PT Treatment/Interventions ADLs/Self Care Home Management;Moist Heat;Gait training;Stair training;Functional mobility training;Therapeutic activities;Therapeutic exercise;Balance training;Neuromuscular re-education;Patient/family education;Passive range of motion;Visual/perceptual remediation/compensation    PT Next Visit Plan review HEP and d/c    PT Home Exercise Plan updated HEP (see note)    Consulted and Agree with Plan of Care Patient;Family member/caregiver           Patient will benefit from skilled therapeutic intervention in order to improve the following deficits and impairments:  Abnormal gait,Decreased balance,Decreased endurance,Difficulty walking,Impaired vision/preception,Decreased activity tolerance,Decreased coordination,Decreased  strength,Impaired flexibility,Impaired UE functional use,Postural dysfunction,Pain,Impaired perceived functional ability,Decreased range of motion  Visit Diagnosis: Low vision, both eyes  Unsteadiness on feet     Problem List Patient Active Problem List   Diagnosis Date Noted  . Cerebral thrombosis with cerebral infarction 04/29/2020  . Change in vision 04/27/2020  . Essential hypertension 04/27/2020  . Hyperlipidemia 04/27/2020  . Obesity (BMI 30-39.9) 04/27/2020  . Pre-diabetes 02/03/2020  . Cerebral hemorrhage (HCC) 02/02/2020  . Anemia 02/02/2020  . Disorder of bursae of shoulder region 02/02/2020  . Supraventricular tachycardia (HCC) 02/02/2020  . TIA (transient ischemic attack) 02/02/2020  . History of revision  of total replacement of left hip joint 11/30/2018  . Osteoarthritis of left hip 11/21/2018   Temple Pacini PT, DPT  Baird Kay 07/09/2020, 4:32 PM  [facility] MAIN [facility] SERVICES [address] [place], [place], [address] Phone: [phone]   Fax:  [phone]  Name: GENIE MIRABAL MRN: [id number] Date of Birth: June 22, 1960

## 2020-07-14 ENCOUNTER — Ambulatory Visit: Payer: Medicaid Other

## 2020-07-14 ENCOUNTER — Ambulatory Visit: Payer: Medicaid Other | Admitting: Adult Health

## 2020-07-16 ENCOUNTER — Ambulatory Visit: Payer: Medicaid Other

## 2020-07-16 ENCOUNTER — Other Ambulatory Visit: Payer: Self-pay

## 2020-07-16 ENCOUNTER — Ambulatory Visit: Payer: Medicaid Other | Admitting: Occupational Therapy

## 2020-07-16 ENCOUNTER — Encounter: Payer: Self-pay | Admitting: Occupational Therapy

## 2020-07-16 DIAGNOSIS — M6281 Muscle weakness (generalized): Secondary | ICD-10-CM

## 2020-07-16 DIAGNOSIS — R278 Other lack of coordination: Secondary | ICD-10-CM

## 2020-07-16 NOTE — Therapy (Signed)
[place] MAIN [facility] SERVICES [address] [place], [place], [address] Phone: [phone]   Fax:  [phone]  Occupational Therapy Treatment  Patient Details  Name: Travis Chavez MRN: [id number] Date of Birth: 1960/09/27 No data recorded  Encounter Date: 07/16/2020   OT End of Session - 07/16/20 1404    Visit Number 11    Number of Visits 24    Date for OT Re-Evaluation 08/14/20    Authorization Type progress report period  starting 05/22/2020    OT Start Time 1350    OT Stop Time 1430    OT Time Calculation (min) 40 min    Activity Tolerance Patient tolerated treatment well    Behavior During Therapy [facility] for tasks assessed/performed           Past Medical History:  Diagnosis Date  . Hypertension   . Stroke [facility])     History reviewed. No pertinent surgical history.  There were no vitals filed for this visit.   Subjective Assessment - 07/16/20 1402    Subjective  Pt. reports that he is trying to use his right hand for as much as he ca.    Pertinent History Pt. is a 60 y.o. male who was admitted to [facility] with a TIA in 01/2020. Pt. was the admitted to [facility] on 04/27/2020. Pt.  was diagnosed with an acute infarct in the Right PCA territory, subacute left corpus collosum, and chronic infarcts in the left corona radiata, basal ganglia, and RIght cerrebellum. PMHx includes: HLD, HTN, GERD, Arrythmia, right CVA.    Currently in Pain? No/denies          OT TREATMENT   Therapeutic Exercise:  Pt. performed 4# dowel ex. for UE strengthening secondary to weakness. Bilateral shoulder flexion, chest press, circular patterns, and elbow flexion/extension were performed for 1 set 10 reps, followed by 3# dowel for 1 set 10 reps.Pt. performed 2#for forearm supination/pronation, wrist flexion/extension, and radial deviation.   Neuromuscular re-ed:  Pt. worked on bilateral [facility] skills grasping 1" sticks from the  shallow dish on the [employer] pegboard. Pt. worked on sliding 1/4" washers off the edge of an elevated surface with his 2nd digit to his thumb in preparation for grasping them, and placing them in a shallow dish.  Pt. reports changes in his vision, with his vision becoming more hazey. Pt. was able to complete the BUE exercises, however required the weight to be modified for the 2nd set. Pt. Required reset breaks, and presented with less clonus today. Pt. is improving with [facility] skills grasping smaller objects. Pt continues to work on improving RUE ROM, and bialteralUE strengthening in order to improve, and maximize independence with ADLs, and IADL tasks.                       OT Education - 07/16/20 1404    Education Details UE strengthening    Person(s) Educated Patient    Methods Explanation;Demonstration    Comprehension Verbalized understanding;Returned demonstration               OT Long Term Goals - 07/09/20 0955      OT LONG TERM GOAL #1   Title Pt. will improvie LUE strength by 2 mm grades to improve ADL, and IADL functioning.    Baseline Eval: Pt. presents with decreased LUE strength limiting ADL functioning.    Time 12    Period Weeks    Status  On-going    Target Date 08/14/20      OT LONG TERM GOAL #2   Title Pt. will demonstrate visual compensatory strategies 100% of the time for navigating through his environment.    Baseline 07/09/2020: P.t is able to use visual compensatory strategies, and is now navigating through his environment. Eval:  pt. requires assist to navigate through his environment safely    Time 12    Period Weeks    Status Achieved      OT LONG TERM GOAL #3   Title Pt. will demonstrate visual compensatory strategies 100% of the time to be able to complete tabletop top tasks independently    Baseline 07/09/2020:  Pt. is utilizing visual compensatory strategies  for tabletop taks. Eval: pt. requires assist    Time 12    Period Weeks     Status Achieved      OT LONG TERM GOAL #4   Title Pt. will accurately probelm solve through safety awareness, and judgement questions for ADLs/IADLs with 100% accuracy.    Baseline 07/09/2020: Met    Time 12    Period Weeks    Status Achieved      OT LONG TERM GOAL #5   Title Pt. will improve FOTO score by 2 points for improved functional outcomes.    Baseline 07/09/2020: SCore: 63  Eval: FOTO score 72    Time 12    Period Weeks    Status On-going    Target Date 08/14/20                 Plan - 07/16/20 1404    Clinical Impression Statement Pt. reports changes in his vision, with his vision becoming more hazey. Pt. was able to complete the BUE exercises, however required the weight to be modified for the 2nd set. Pt. Required reset breaks, and presented with less clonus today. Pt. is improving with [facility] skills grasping smaller objects. Pt continues to work on improving RUE ROM, and bialteralUE strengthening in order to improve, and maximize independence with ADLs, and IADL tasks.   OT Occupational Profile and History Problem Focused Assessment - Including review of records relating to presenting problem    Occupational performance deficits (Please refer to evaluation for details): ADL's;IADL's;Education    Rehab Potential Excellent    Clinical Decision Making Several treatment options, min-mod task modification necessary    Comorbidities Affecting Occupational Performance: May have comorbidities impacting occupational performance    Modification or Assistance to Complete Evaluation  Min-Moderate modification of tasks or assist with assess necessary to complete eval    OT Frequency 2x / week    OT Duration 12 weeks    OT Treatment/Interventions Self-care/ADL training;Therapeutic exercise;Neuromuscular education;Therapeutic activities;Patient/family education;DME and/or AE instruction;Energy conservation    Consulted and Agree with Plan of Care Patient           Patient will  benefit from skilled therapeutic intervention in order to improve the following deficits and impairments:           Visit Diagnosis: Muscle weakness (generalized)    Problem List Patient Active Problem List   Diagnosis Date Noted  . Cerebral thrombosis with cerebral infarction 04/29/2020  . Change in vision 04/27/2020  . Essential hypertension 04/27/2020  . Hyperlipidemia 04/27/2020  . Obesity (BMI 30-39.9) 04/27/2020  . Pre-diabetes 02/03/2020  . Cerebral hemorrhage ([place]) 02/02/2020  . Anemia 02/02/2020  . Disorder of bursae of shoulder region 02/02/2020  . Supraventricular tachycardia ([place]) 02/02/2020  . TIA (  transient ischemic attack) 02/02/2020  . 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 Osteoarthritis of left hip 11/21/2018    Harrel Carina, MS, OTR/L 07/16/2020, 2:12 PM  [place] MAIN [facility] SERVICES [address] [place], [place], [address] Phone: [phone]   Fax:  [phone]  Name: TALOR CHEEMA MRN: [id number] Date of Birth: 13-Jul-1960

## 2020-07-21 ENCOUNTER — Ambulatory Visit: Payer: Medicaid Other

## 2020-07-24 ENCOUNTER — Other Ambulatory Visit: Payer: Self-pay

## 2020-07-24 ENCOUNTER — Encounter: Payer: Self-pay | Admitting: Occupational Therapy

## 2020-07-24 ENCOUNTER — Ambulatory Visit: Payer: Medicaid Other | Admitting: Occupational Therapy

## 2020-07-24 ENCOUNTER — Ambulatory Visit: Payer: Medicaid Other

## 2020-07-24 DIAGNOSIS — R269 Unspecified abnormalities of gait and mobility: Secondary | ICD-10-CM

## 2020-07-24 DIAGNOSIS — M6281 Muscle weakness (generalized): Secondary | ICD-10-CM | POA: Diagnosis not present

## 2020-07-24 DIAGNOSIS — R278 Other lack of coordination: Secondary | ICD-10-CM

## 2020-07-24 NOTE — Therapy (Signed)
[place] MAIN [facility] SERVICES [address] [place], [place], [address] Phone: [phone]   Fax:  [phone]  Occupational Therapy Treatment  Patient Details  Name: Travis Chavez MRN: [id number] Date of Birth: [birth year] No data recorded  Encounter Date: 07/24/2020   OT End of Session - 07/24/20 1633    Visit Number 12    Number of Visits 24    Date for OT Re-Evaluation 08/14/20    Authorization Type progress report period  starting 05/22/2020    OT Start Time 1625    OT Stop Time 1710    OT Time Calculation (min) 45 min    Activity Tolerance Patient tolerated treatment well    Behavior During Therapy [facility] for tasks assessed/performed           Past Medical History:  Diagnosis Date  . Hypertension   . Stroke [facility])     History reviewed. No pertinent surgical history.  There were no vitals filed for this visit.   Subjective Assessment - 07/24/20 1632    Subjective  Pt. reports that he is trying to use his right hand for as much as he ca.    Pertinent History Pt. is a 60 y.o. male who was admitted to [facility] with a TIA in 01/2020. Pt. was the admitted to [facility] on 04/27/2020. Pt.  was diagnosed with an acute infarct in the Right PCA territory, subacute left corpus collosum, and chronic infarcts in the left corona radiata, basal ganglia, and RIght cerrebellum. PMHx includes: HLD, HTN, GERD, Arrythmia, right CVA.    Currently in Pain? No/denies          OT TREATMENT    Therapeutic Activities:  Pt. worked on visual scanning tasks using [employer] patterns. Pt. started with simple designs, and moved to one's of moderate complexity. Pt. required increased time to complete as well as verbal, and visual cues to complete each task.  Neuromuscular re-education:  Pt. worked on using his right hand to grasp the [employer] pieces, placing them into a container, and actively extending his digits with each  release. Pt. required proprioceptive input through the right hand during the task. Pt. Required restbreaks secondary to fatigue when using the right hand.   Pt. reports that he followed up with the eye doctor this past week. Pt. reports that he explored a large wooded area on his land with friends this weekend. Pt. required less verbal, and visual cues for distinguishing between green, and blue pieces. Pt. required verbal, and visual cues to correct the right side of the simple design pattern. Pt. required increased time to complete the design of moderate complexity, however did not require any cues on the right.  Pt. continues to work on improving visual scanning, and visual search strategies in preparation for navigating through his environment, and completing tabletop tasks.                        OT Education - 07/24/20 1633    Education Details UE strengthening    Person(s) Educated Patient    Methods Explanation;Demonstration    Comprehension Verbalized understanding;Returned demonstration               OT Long Term Goals - 07/09/20 0955      OT LONG TERM GOAL #1   Title Pt. will improvie LUE strength by 2 mm grades to improve ADL, and IADL functioning.  Baseline Eval: Pt. presents with decreased LUE strength limiting ADL functioning.    Time 12    Period Weeks    Status On-going    Target Date 08/14/20      OT LONG TERM GOAL #2   Title Pt. will demonstrate visual compensatory strategies 100% of the time for navigating through his environment.    Baseline 07/09/2020: P.t is able to use visual compensatory strategies, and is now navigating through his environment. Eval:  pt. requires assist to navigate through his environment safely    Time 12    Period Weeks    Status Achieved      OT LONG TERM GOAL #3   Title Pt. will demonstrate visual compensatory strategies 100% of the time to be able to complete tabletop top tasks independently    Baseline  07/09/2020:  Pt. is utilizing visual compensatory strategies  for tabletop taks. Eval: pt. requires assist    Time 12    Period Weeks    Status Achieved      OT LONG TERM GOAL #4   Title Pt. will accurately probelm solve through safety awareness, and judgement questions for ADLs/IADLs with 100% accuracy.    Baseline 07/09/2020: Met    Time 12    Period Weeks    Status Achieved      OT LONG TERM GOAL #5   Title Pt. will improve FOTO score by 2 points for improved functional outcomes.    Baseline 07/09/2020: SCore: 63  Eval: FOTO score 72    Time 12    Period Weeks    Status On-going    Target Date 08/14/20                 Plan - 07/24/20 1634    Clinical Impression Statement Pt. reports that he followed up with the eye doctor this past week. Pt. reports that he explored a large wooded area on his land with friends this weekend. Pt. required less verbal, and visual cues for distinguishing between green, and blue pieces. Pt. required verbal, and visual cues to correct the right side of the simple design pattern. Pt. required increased time to complete the design of moderate complexity, however did not require any cues on the right.  Pt. continues to work on improving visual scanning, and visual search strategies in preparation for navigating through his environment, and completing tabletop tasks.    OT Occupational Profile and History Problem Focused Assessment - Including review of records relating to presenting problem    Occupational performance deficits (Please refer to evaluation for details): ADL's;IADL's;Education    Rehab Potential Excellent    Clinical Decision Making Several treatment options, min-mod task modification necessary    Comorbidities Affecting Occupational Performance: May have comorbidities impacting occupational performance    Modification or Assistance to Complete Evaluation  Min-Moderate modification of tasks or assist with assess necessary to complete eval     OT Frequency 2x / week    OT Duration 12 weeks    OT Treatment/Interventions Self-care/ADL training;Therapeutic exercise;Neuromuscular education;Therapeutic activities;Patient/family education;DME and/or AE instruction;Energy conservation    Consulted and Agree with Plan of Care Patient           Patient will benefit from skilled therapeutic intervention in order to improve the following deficits and impairments:           Visit Diagnosis: Muscle weakness (generalized)  Other lack of coordination    Problem List Patient Active Problem List   Diagnosis Date Noted  .  Cerebral thrombosis with cerebral infarction 04/29/2020  . Change in vision 04/27/2020  . Essential hypertension 04/27/2020  . Hyperlipidemia 04/27/2020  . Obesity (BMI 30-39.9) 04/27/2020  . Pre-diabetes 02/03/2020  . Cerebral hemorrhage ([place]) 02/02/2020  . Anemia 02/02/2020  . Disorder of bursae of shoulder region 02/02/2020  . Supraventricular tachycardia ([place]) 02/02/2020  . TIA (transient ischemic attack) 02/02/2020  . 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 Osteoarthritis of left hip 11/21/2018    Harrel Carina, MS, OTR/L 07/24/2020, 4:37 PM  [place] MAIN [facility] SERVICES [address] [place], [place], [address] Phone: [phone]   Fax:  [phone]  Name: Travis Chavez MRN: [id number] Date of Birth: [birth year]

## 2020-07-24 NOTE — Therapy (Signed)
[facility] MAIN [facility] SERVICES [address] [place], [place], [address] Phone: [phone]   Fax:  [phone]  Physical Therapy Treatment/DISCHARGE SUMMARY   Patient Details  Name: Travis Chavez MRN: [id number] Date of Birth: November 09, 1960 Referring Provider (PT): Delia Heady   Encounter Date: 07/24/2020   PT End of Session - 07/24/20 1917    Visit Number 11    Number of Visits 17    Date for PT Re-Evaluation 07/15/20    PT Start Time 1526    PT Stop Time 1600    PT Time Calculation (min) 34 min    Equipment Utilized During Treatment Gait belt    Activity Tolerance Patient tolerated treatment well    Behavior During Therapy [facility] for tasks assessed/performed           Past Medical History:  Diagnosis Date  . Hypertension   . Stroke [facility])     History reviewed. No pertinent surgical history.  There were no vitals filed for this visit.    Treatment:  FOTO: 98%  Therapeutic Exercise: pt rates all exercises today as "easy"  Sit to Stand - 3 sets - 10 reps  Step ups onto 6" step, CGA- 3 sets - 10 reps  Seated Hip Abduction with BTB - 3 sets - 10 reps B LES  Seated Hamstring Curls with BTB- 3 sets - 10 reps B LEs  Staggered sit<>stand 1x5 BLEs   Neuromuscular Re-Ed:  Ambulation 10 meters with vertical heads turns 2x, supervision  Ambulation 10 meters with EC, CGA - deviates within <6 inches from straight line.   Assessment: Pt present for follow-up session prior to discharge. Pt reports improvements in activity outside of therapy. PT reviewed HEP with pt. Pt reports all exercises performed today as easy. PT provided pt with green and blue therabands for home to progress difficulty with HEP outside therapy. Pt also has weights at home which he can use to progress exercises and was instructed in how to do so. Pt did not have difficulty with ambulation with vertical head turns and demonstrated a deviation ambulating with EC  within <6 inches from straight line. Pt has continued to make gains beyond therapy, is independent with HEP and does not require further skilled PT at this time.      PT Short Term Goals - 07/09/20 1019      PT SHORT TERM GOAL #1   Title Patient will be independent in home exercise program to improve strength/mobility for better functional independence with ADLs.    Baseline 07/09/2020    Status Achieved    Target Date 07/15/20             PT Long Term Goals - 07/09/20 1021      PT LONG TERM GOAL #1   Title Patient will increase FOTO score to equal to or greater than 78% to demonstrate statistically significant improvement in mobility and quality of life.    Baseline 64%; 07/09/2020 85%    Status Achieved      PT LONG TERM GOAL #2   Title Patient (< 14 years old) will complete five times sit to stand test in < 10 seconds indicating an increased LE strength and improved balance.    Baseline 16.32 seconds; 07/09/2020 7.2 seconds    Status Achieved      PT LONG TERM GOAL #3   Title Patient will increase 10 meter walk test to >1.44m/s as to improve gait speed for better  community ambulation and to reduce fall risk.    Baseline 1.5 m/s; 07/09/2020 1.11 m/s no AD    Status Achieved      PT LONG TERM GOAL #4   Title Patient will increase Functional Gait Assessment score to >20/30 as to reduce fall risk and improve dynamic gait safety with community ambulation    Baseline 28/30    Status Achieved                 Plan - 07/24/20 1926    Clinical Impression Statement Pt present for follow-up session prior to discharge. Pt reports improvements in activity outside of therapy. PT reviewed HEP with pt. Pt reports all exercises performed today as easy. PT provided pt with green and blue therabands for home to progress difficulty with HEP outside therapy. Pt also has weights at home which he can use to progress exercises and was instructed in how to do so. Pt did not have difficulty with  ambulation with vertical head turns and demonstrated a deviation ambulating with EC within <6 inches from straight line. Pt has continued to make gains beyond therapy, is independent with HEP and does not require further skilled PT at this time.    Personal Factors and Comorbidities Comorbidity 3+    Examination-Activity Limitations Bathing;Lift;Locomotion Level;Stand;Toileting;Carry;Stairs;Squat    Examination-Participation Restrictions Occupation;Driving;Community Activity    Stability/Clinical Decision Making Evolving/Moderate complexity    Rehab Potential Good    PT Frequency 2x / week    PT Duration 8 weeks    PT Treatment/Interventions ADLs/Self Care Home Management;Moist Heat;Gait training;Stair training;Functional mobility training;Therapeutic activities;Therapeutic exercise;Balance training;Neuromuscular re-education;Patient/family education;Passive range of motion;Visual/perceptual remediation/compensation    PT Next Visit Plan review HEP and d/c    PT Home Exercise Plan updated HEP (see note)    Consulted and Agree with Plan of Care Patient;Family member/caregiver           Patient will benefit from skilled therapeutic intervention in order to improve the following deficits and impairments:  Abnormal gait,Decreased balance,Decreased endurance,Difficulty walking,Impaired vision/preception,Decreased activity tolerance,Decreased coordination,Decreased strength,Impaired flexibility,Impaired UE functional use,Postural dysfunction,Pain,Impaired perceived functional ability,Decreased range of motion  Visit Diagnosis: Abnormality of gait and mobility     Problem List Patient Active Problem List   Diagnosis Date Noted  . Cerebral thrombosis with cerebral infarction 04/29/2020  . Change in vision 04/27/2020  . Essential hypertension 04/27/2020  . Hyperlipidemia 04/27/2020  . Obesity (BMI 30-39.9) 04/27/2020  . Pre-diabetes 02/03/2020  . Cerebral hemorrhage (HCC) 02/02/2020  .  Anemia 02/02/2020  . Disorder of bursae of shoulder region 02/02/2020  . Supraventricular tachycardia (HCC) 02/02/2020  . TIA (transient ischemic attack) 02/02/2020  . 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 Osteoarthritis of left hip 11/21/2018   Temple Pacini PT, DPT 07/24/2020, 7:35 PM  [employer] [facility] MAIN [facility] SERVICES [address] [place], [place], [address] Phone: [phone]   Fax:  [phone]  Name: Travis Chavez MRN: [id number] Date of Birth: Nov 20, 1960

## 2020-07-28 ENCOUNTER — Ambulatory Visit: Payer: Medicaid Other | Admitting: Occupational Therapy

## 2020-07-28 ENCOUNTER — Encounter: Payer: Self-pay | Admitting: Occupational Therapy

## 2020-07-28 ENCOUNTER — Ambulatory Visit: Payer: Medicaid Other

## 2020-07-28 ENCOUNTER — Other Ambulatory Visit: Payer: Self-pay

## 2020-07-28 DIAGNOSIS — M6281 Muscle weakness (generalized): Secondary | ICD-10-CM | POA: Diagnosis not present

## 2020-07-28 NOTE — Therapy (Signed)
[place] MAIN [facility] SERVICES [address] [place], [place], [address] Phone: [phone]   Fax:  [phone]  Occupational Therapy Treatment  Patient Details  Name: Travis Chavez MRN: [id number] Date of Birth: 1960-11-18 No data recorded  Encounter Date: 07/28/2020   OT End of Session - 07/28/20 0940    Visit Number 13    Number of Visits 24    Date for OT Re-Evaluation 08/14/20    Authorization Type progress report period  starting 05/22/2020    OT Start Time 0933    OT Stop Time 1015    OT Time Calculation (min) 42 min    Activity Tolerance Patient tolerated treatment well    Behavior During Therapy [facility] for tasks assessed/performed           Past Medical History:  Diagnosis Date  . Hypertension   . Stroke [facility])     History reviewed. No pertinent surgical history.  There were no vitals filed for this visit.   Subjective Assessment - 07/28/20 0939    Subjective  Pt. reports that he is trying to use his right hand for as much as he ca.    Pertinent History Pt. is a 60 y.o. male who was admitted to [facility] with a TIA in 01/2020. Pt. was the admitted to [facility] on 04/27/2020. Pt.  was diagnosed with an acute infarct in the Right PCA territory, subacute left corpus collosum, and chronic infarcts in the left corona radiata, basal ganglia, and RIght cerrebellum. PMHx includes: HLD, HTN, GERD, Arrythmia, right CVA.    Currently in Pain? No/denies          OT TREATMENT    Therapeutic Activities:  Pt. 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   Pt. has made progress overall. Pt. is now engaging his right hand more during daily ADL, and IADL tasks. Pt. has made progress overall. Pt. worked on progressing from simple to more complex design patterns. Pt. had difficulty, and required cues for the mirror image. Pt. was able  to initiate, and engage his right hand while grasping, and fitting the design pieces. Pt. Used his left hand to readjust, and fit the pieces into place. Pt. continues to work on improving UE strength, [facility] skills, and visual perceptual skills in order to work towards improving,and maximizing independence with ADLs, and IADLs.                       OT Education - 07/28/20 0940    Education Details UE strengthening    Person(s) Educated Patient    Methods Explanation;Demonstration    Comprehension Verbalized understanding;Returned demonstration               OT Long Term Goals - 07/09/20 0955      OT LONG TERM GOAL #1   Title Pt. will improvie LUE strength by 2 mm grades to improve ADL, and IADL functioning.    Baseline Eval: Pt. presents with decreased LUE strength limiting ADL functioning.    Time 12    Period Weeks    Status On-going    Target Date 08/14/20      OT LONG TERM GOAL #2   Title Pt. will demonstrate visual compensatory strategies 100% of the time for navigating through his environment.    Baseline 07/09/2020: P.t is able to use visual compensatory strategies, and is  now navigating through his environment. Eval:  pt. requires assist to navigate through his environment safely    Time 12    Period Weeks    Status Achieved      OT LONG TERM GOAL #3   Title Pt. will demonstrate visual compensatory strategies 100% of the time to be able to complete tabletop top tasks independently    Baseline 07/09/2020:  Pt. is utilizing visual compensatory strategies  for tabletop taks. Eval: pt. requires assist    Time 12    Period Weeks    Status Achieved      OT LONG TERM GOAL #4   Title Pt. will accurately probelm solve through safety awareness, and judgement questions for ADLs/IADLs with 100% accuracy.    Baseline 07/09/2020: Met    Time 12    Period Weeks    Status Achieved      OT LONG TERM GOAL #5   Title Pt. will improve FOTO score by 2 points for  improved functional outcomes.    Baseline 07/09/2020: SCore: 63  Eval: FOTO score 72    Time 12    Period Weeks    Status On-going    Target Date 08/14/20                 Plan - 07/28/20 0942    Clinical Impression Statement Pt. has made progress overall. Pt. is now engaging his right hand more during daily ADL, and IADL tasks. Pt. has made progress overall. Pt. worked on progressing from simple to more complex design patterns. Pt. had difficulty, and required cues for the mirror image. Pt. was able to initiate, and engage his right hand while grasping, and fitting the design pieces. Pt. Used his left hand to readjust, and fit the pieces into place. Pt. continues to work on improving UE strength, [facility] skills, and visual perceptual skills in order to work towards improving, and maximizing independence with ADLs, and IADLs.   OT Occupational Profile and History Problem Focused Assessment - Including review of records relating to presenting problem    Occupational performance deficits (Please refer to evaluation for details): ADL's;IADL's;Education    Rehab Potential Excellent    Clinical Decision Making Several treatment options, min-mod task modification necessary    Comorbidities Affecting Occupational Performance: May have comorbidities impacting occupational performance    Modification or Assistance to Complete Evaluation  Min-Moderate modification of tasks or assist with assess necessary to complete eval    OT Frequency 2x / week    OT Duration 12 weeks    OT Treatment/Interventions Self-care/ADL training;Therapeutic exercise;Neuromuscular education;Therapeutic activities;Patient/family education;DME and/or AE instruction;Energy conservation    Consulted and Agree with Plan of Care Patient           Patient will benefit from skilled therapeutic intervention in order to improve the following deficits and impairments:           Visit Diagnosis: Muscle weakness  (generalized)    Problem List Patient Active Problem List   Diagnosis Date Noted  . Cerebral thrombosis with cerebral infarction 04/29/2020  . Change in vision 04/27/2020  . Essential hypertension 04/27/2020  . Hyperlipidemia 04/27/2020  . Obesity (BMI 30-39.9) 04/27/2020  . Pre-diabetes 02/03/2020  . Cerebral hemorrhage ([place]) 02/02/2020  . Anemia 02/02/2020  . Disorder of bursae of shoulder region 02/02/2020  . Supraventricular tachycardia ([place]) 02/02/2020  . TIA (transient ischemic attack) 02/02/2020  . 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 Osteoarthritis of  left hip 11/21/2018    Harrel Carina, MS, OTR/L 07/28/2020, 9:46 AM  [place] MAIN [facility] SERVICES [address] [place], [place], [address] Phone: [phone]   Fax:  [phone]  Name: Travis Chavez MRN: [id number] Date of Birth: Dec 19, 1960

## 2020-07-30 ENCOUNTER — Encounter: Payer: Self-pay | Admitting: Occupational Therapy

## 2020-07-30 ENCOUNTER — Other Ambulatory Visit: Payer: Self-pay

## 2020-07-30 ENCOUNTER — Ambulatory Visit: Payer: Medicaid Other

## 2020-07-30 ENCOUNTER — Ambulatory Visit: Payer: Medicaid Other | Admitting: Occupational Therapy

## 2020-07-30 DIAGNOSIS — R278 Other lack of coordination: Secondary | ICD-10-CM

## 2020-07-30 DIAGNOSIS — M6281 Muscle weakness (generalized): Secondary | ICD-10-CM | POA: Diagnosis not present

## 2020-07-30 NOTE — Therapy (Signed)
[place] MAIN [facility] SERVICES [address] [place], [place], [address] Phone: [phone]   Fax:  [phone]  Occupational Therapy Treatment  Patient Details  Name: Travis Chavez MRN: [id number] Date of Birth: [birth year] No data recorded  Encounter Date: 07/30/2020   OT End of Session - 07/30/20 1735    Visit Number 14    Number of Visits 24    Date for OT Re-Evaluation 08/14/20    Authorization Type progress report period  starting 05/22/2020    OT Start Time 0934    OT Stop Time 1015    OT Time Calculation (min) 41 min    Activity Tolerance Patient tolerated treatment well    Behavior During Therapy [facility] for tasks assessed/performed           Past Medical History:  Diagnosis Date  . Hypertension   . Stroke [facility])     History reviewed. No pertinent surgical history.  There were no vitals filed for this visit.   Subjective Assessment - 07/30/20 1734    Subjective  Pt. reports that has improved with using his right hand.    Pertinent History Pt. is a 60 y.o. male who was admitted to [facility] with a TIA in 01/2020. Pt. was the admitted to [facility] on 04/27/2020. Pt.  was diagnosed with an acute infarct in the Right PCA territory, subacute left corpus collosum, and chronic infarcts in the left corona radiata, basal ganglia, and RIght cerrebellum. PMHx includes: HLD, HTN, GERD, Arrythmia, right CVA.    Patient Stated Goals To be independent as possible, "do as much as I can"    Currently in Pain? No/denies              [facility] OT Assessment - 07/30/20 0001      Hand Function   Right Hand Grip (lbs) 45    Right Hand Lateral Pinch 15 lbs    Right Hand 3 Point Pinch 11 lbs          OT TREATMENT    Therapeutic Activities:  Pt. worked on visual scanning tasks at the tabletop requiring the pt. to grasp cards adding up to 15 using his right hand to hold the cards.   Therapeutic Exercise:  Bilateral grip,  and pinch strength measurements were obtained.   Pt. reports that he is now consistently using his right hand at home to assist with tasks including holding the dog bowl when preparing to feed his dog. Pt. required cues to engage his RUE during the session. Pt. Presented with increased flexor tone when attempting to use his hand. Pt. Education was provided about weightbearing, and proprioceptive input through his RUE, and hand when pt. Has increased flexor tone, and tightness. Plan for discharge next OT visit.                         OT Long Term Goals - 07/09/20 0955      OT LONG TERM GOAL #1   Title Pt. will improvie LUE strength by 2 mm grades to improve ADL, and IADL functioning.    Baseline Eval: Pt. presents with decreased LUE strength limiting ADL functioning.    Time 12    Period Weeks    Status On-going    Target Date 08/14/20      OT LONG TERM GOAL #2   Title Pt. will demonstrate visual compensatory strategies 100% of the time for navigating through  his environment.    Baseline 07/09/2020: P.t is able to use visual compensatory strategies, and is now navigating through his environment. Eval:  pt. requires assist to navigate through his environment safely    Time 12    Period Weeks    Status Achieved      OT LONG TERM GOAL #3   Title Pt. will demonstrate visual compensatory strategies 100% of the time to be able to complete tabletop top tasks independently    Baseline 07/09/2020:  Pt. is utilizing visual compensatory strategies  for tabletop taks. Eval: pt. requires assist    Time 12    Period Weeks    Status Achieved      OT LONG TERM GOAL #4   Title Pt. will accurately probelm solve through safety awareness, and judgement questions for ADLs/IADLs with 100% accuracy.    Baseline 07/09/2020: Met    Time 12    Period Weeks    Status Achieved      OT LONG TERM GOAL #5   Title Pt. will improve FOTO score by 2 points for improved functional outcomes.     Baseline 07/09/2020: SCore: 63  Eval: FOTO score 72    Time 12    Period Weeks    Status On-going    Target Date 08/14/20                 Plan - 07/30/20 1735    Clinical Impression Statement Pt. reports that he is now consistently using his right hand at home to assist with tasks including holding the dog bowl when preparing to feed his dog. Pt. required cues to engage his RUE during the session. Pt. Presented with increased flexor tone when attempting to use his hand. Pt. Education was provided about weightbearing, and proprioceptive input through his RUE, and hand when pt. Has increased flexor tone, and tightness. Plan for discharge next OT visit.   OT Occupational Profile and History Problem Focused Assessment - Including review of records relating to presenting problem    Occupational performance deficits (Please refer to evaluation for details): ADL's;IADL's;Education    Rehab Potential Excellent    Clinical Decision Making Several treatment options, min-mod task modification necessary    Comorbidities Affecting Occupational Performance: May have comorbidities impacting occupational performance    Modification or Assistance to Complete Evaluation  Min-Moderate modification of tasks or assist with assess necessary to complete eval    OT Frequency 2x / week    OT Duration 12 weeks    OT Treatment/Interventions Self-care/ADL training;Therapeutic exercise;Neuromuscular education;Therapeutic activities;Patient/family education;DME and/or AE instruction;Energy conservation    Consulted and Agree with Plan of Care Patient           Patient will benefit from skilled therapeutic intervention in order to improve the following deficits and impairments:           Visit Diagnosis: Muscle weakness (generalized)  Other lack of coordination    Problem List Patient Active Problem List   Diagnosis Date Noted  . Cerebral thrombosis with cerebral infarction 04/29/2020  . Change in  vision 04/27/2020  . Essential hypertension 04/27/2020  . Hyperlipidemia 04/27/2020  . Obesity (BMI 30-39.9) 04/27/2020  . Pre-diabetes 02/03/2020  . Cerebral hemorrhage ([place]) 02/02/2020  . Anemia 02/02/2020  . Disorder of bursae of shoulder region 02/02/2020  . Supraventricular tachycardia ([place]) 02/02/2020  . TIA (transient ischemic attack) 02/02/2020  . 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 Osteoarthritis of left hip  11/21/2018    Harrel Carina, MS, OTR/L 07/30/2020, 5:37 PM  [place] MAIN [facility] SERVICES [address] [place], [place], [address] Phone: [phone]   Fax:  [phone]  Name: ERIS BRECK MRN: [id number] Date of Birth: Jan 18, 1961

## 2020-08-04 ENCOUNTER — Ambulatory Visit: Payer: Medicaid Other | Admitting: Occupational Therapy

## 2020-08-04 ENCOUNTER — Ambulatory Visit: Payer: Medicaid Other

## 2020-08-06 ENCOUNTER — Encounter: Payer: Self-pay | Admitting: Occupational Therapy

## 2020-08-06 ENCOUNTER — Ambulatory Visit: Payer: Medicaid Other | Attending: Neurology | Admitting: Occupational Therapy

## 2020-08-06 ENCOUNTER — Ambulatory Visit: Payer: Medicaid Other

## 2020-08-06 DIAGNOSIS — M6281 Muscle weakness (generalized): Secondary | ICD-10-CM | POA: Insufficient documentation

## 2020-08-06 DIAGNOSIS — R278 Other lack of coordination: Secondary | ICD-10-CM | POA: Diagnosis present

## 2020-08-06 NOTE — Therapy (Signed)
[place] MAIN [facility] SERVICES [address] [place], [place], [address] Phone: [phone]   Fax:  [phone]  Occupational Therapy Treatment/Discharge Note  Patient Details  Name: Travis Chavez MRN: [id number] Date of Birth: 10/04/1960 No data recorded  Encounter Date: 08/06/2020   OT End of Session - 08/06/20 1014    Visit Number 15    Number of Visits 24    Date for OT Re-Evaluation 08/14/20    Authorization Type progress report period  starting 05/22/2020    OT Start Time 1015    OT Stop Time 1053    OT Time Calculation (min) 38 min    Activity Tolerance Patient tolerated treatment well    Behavior During Therapy [facility] for tasks assessed/performed           Past Medical History:  Diagnosis Date  . Hypertension   . Stroke [facility])     History reviewed. No pertinent surgical history.  There were no vitals filed for this visit.   Subjective Assessment - 08/06/20 1014    Subjective  Pt. reports that has improved with using his right hand.    Pertinent History Pt. is a 60 y.o. male who was admitted to [facility] with a TIA in 01/2020. Pt. was the admitted to [facility] on 04/27/2020. Pt.  was diagnosed with an acute infarct in the Right PCA territory, subacute left corpus collosum, and chronic infarcts in the left corona radiata, basal ganglia, and RIght cerrebellum. PMHx includes: HLD, HTN, GERD, Arrythmia, right CVA.    Patient Stated Goals To be independent as possible, "do as much as I can"    Currently in Pain? No/denies              [facility] OT Assessment - 08/06/20 1017      Coordination   Right 9 Hole Peg Test 1 min. & 24 sec.      Strength   Overall Strength Comments BUE strength 5/5 overall      Hand Function   Right Hand Grip (lbs) 47    Right Hand Lateral Pinch 17 lbs    Right Hand 3 Point Pinch 11 lbs          Pt. has made excellent progress. Pt. is now using his right hand more at home during  daily ADL, and IADL tasks. Pt.'s FOTO score has progressed to 98. Pt. Has met all goals for for visual compensatory strategies navigating through his environment, as well as for tabletop tasks. Pt. Has improved with UE strength, and right grip strength, and [place] skills. Pt. was provided with a HEP for continued improvement with [facility] skills. Theraputty was upgraded to green theraputty. Pt. Was Pt. Is now appropriate for discharge form OT services.                  OT Education - 08/06/20 1014    Education Details UE strengthening, coordination    Person(s) Educated Patient    Methods Explanation;Demonstration    Comprehension Verbalized understanding;Returned demonstration               OT Long Term Goals - 08/06/20 1038      OT LONG TERM GOAL #1   Title Pt. will improve LUE strength by 2 mm grades to improve ADL, and IADL functioning.    Baseline 08/06/2020: BUE tsrength 5/5    Time 12    Period Weeks    Status Achieved  OT LONG TERM GOAL #5   Title Pt. will improve FOTO score by 2 points for improved functional outcomes.    Baseline 08/06/2020: FOTO 98 07/09/2020: Score: 63  Eval: FOTO score 72    Time 12    Period Weeks    Status Achieved                 Plan - 08/06/20 1015    Clinical Impression Statement Pt. has made excellent progress. Pt. is now using his right hand more at home during daily ADL, and IADL tasks. Pt.'s FOTO score has progressed to 98. Pt. Has met all goals for for visual compensatory strategies navigating through his environment, as well as for tabletop tasks. Pt. Has improved with UE strength, and right grip strength, and [place] skills. Pt. was provided with a HEP for continued improvement with [facility] skills. Theraputty was upgraded to green theraputty. Pt. Was Pt. Is now appropriate for discharge form OT services.     OT Occupational Profile and History Problem Focused Assessment - Including review of records relating to presenting problem     Occupational performance deficits (Please refer to evaluation for details): ADL's;IADL's;Education    Rehab Potential Excellent    Clinical Decision Making Several treatment options, min-mod task modification necessary    Comorbidities Affecting Occupational Performance: May have comorbidities impacting occupational performance    Modification or Assistance to Complete Evaluation  Min-Moderate modification of tasks or assist with assess necessary to complete eval    OT Frequency 2x / week    OT Duration 12 weeks    OT Treatment/Interventions Self-care/ADL training;Therapeutic exercise;Neuromuscular education;Therapeutic activities;Patient/family education;DME and/or AE instruction;Energy conservation    Consulted and Agree with Plan of Care Patient           Patient will benefit from skilled therapeutic intervention in order to improve the following deficits and impairments:           Visit Diagnosis: Muscle weakness (generalized)  Other lack of coordination    Problem List Patient Active Problem List   Diagnosis Date Noted  . Cerebral thrombosis with cerebral infarction 04/29/2020  . Change in vision 04/27/2020  . Essential hypertension 04/27/2020  . Hyperlipidemia 04/27/2020  . Obesity (BMI 30-39.9) 04/27/2020  . Pre-diabetes 02/03/2020  . Cerebral hemorrhage ([place]) 02/02/2020  . Anemia 02/02/2020  . Disorder of bursae of shoulder region 02/02/2020  . Supraventricular tachycardia ([place]) 02/02/2020  . TIA (transient ischemic attack) 02/02/2020  . 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 Osteoarthritis of left hip 11/21/2018    Harrel Carina, MS, OTR/L 08/06/2020, 11:55 AM  [place] MAIN [facility] SERVICES [address] [place], [place], [address] Phone: [phone]   Fax:  [phone]  Name: Travis Chavez MRN: [id number] Date of Birth: [birth year]

## 2020-09-11 ENCOUNTER — Encounter: Payer: Self-pay | Admitting: Adult Health

## 2020-09-11 ENCOUNTER — Ambulatory Visit: Payer: Medicaid Other | Admitting: Adult Health

## 2020-09-11 VITALS — BP 132/90 | HR 80 | Ht 66.0 in | Wt 227.0 lb

## 2020-09-11 DIAGNOSIS — R7303 Prediabetes: Secondary | ICD-10-CM | POA: Diagnosis not present

## 2020-09-11 DIAGNOSIS — I69398 Other sequelae of cerebral infarction: Secondary | ICD-10-CM

## 2020-09-11 DIAGNOSIS — E785 Hyperlipidemia, unspecified: Secondary | ICD-10-CM

## 2020-09-11 DIAGNOSIS — H547 Unspecified visual loss: Secondary | ICD-10-CM

## 2020-09-11 DIAGNOSIS — Z8673 Personal history of transient ischemic attack (TIA), and cerebral infarction without residual deficits: Secondary | ICD-10-CM | POA: Diagnosis not present
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
## 2020-09-11 NOTE — Patient Instructions (Signed)
Continue Brilinta (ticagrelor) 90 mg bid  and atorvastatin  for secondary stroke prevention  We will check cholesterol and A1c today  Follow up with Dr. Dione Booze and request information be faxed to office after visit  Continue to follow up with PCP regarding cholesterol, blood pressure and pre-diabetes management  Maintain strict control of hypertension with blood pressure goal below 130/90, pre-diabetes with hemoglobin A1c goal below 7% and cholesterol with LDL cholesterol (bad cholesterol) goal below 70 mg/dL.       Followup in the future with me in 6 months or call earlier if needed       Thank you for coming to see [place] at [facility]. I hope we have been able to provide you high quality care today.  You may receive a patient satisfaction survey over the next few weeks. We would appreciate your feedback and comments so that we may continue to improve ourselves and the health of our patients.

## 2020-09-12 LAB — HEMOGLOBIN A1C
Est. average glucose Bld gHb Est-mCnc: 131 mg/dL
Hgb A1c MFr Bld: 6.2 % — ABNORMAL HIGH (ref 4.8–5.6)

## 2020-09-12 LAB — LIPID PANEL
Chol/HDL Ratio: 2.3 ratio (ref 0.0–5.0)
Cholesterol, Total: 96 mg/dL — ABNORMAL LOW (ref 100–199)
HDL: 41 mg/dL (ref >39)
LDL Chol Calc (NIH): 38 mg/dL (ref 0–99)
Triglycerides: 84 mg/dL (ref 0–149)
VLDL Cholesterol Cal: 17 mg/dL (ref 5–40)

## 2020-09-14 NOTE — Progress Notes (Signed)
I agree with the above plan 

## 2020-09-16 ENCOUNTER — Telehealth: Payer: Self-pay

## 2020-09-16 NOTE — Telephone Encounter (Signed)
Contacted pt regarding labs, LVM asking him to give me a call back

## 2020-09-17 NOTE — Telephone Encounter (Signed)
Contacted pt again regarding labs, informed him that recent lipid panel satisfactory and to continue on atorvastatin 80 mg daily. A1c slightly increased compared to prior lab work at 6.2 which is still in pre-diabetes level - encouraged him to f/u with PCP for continued monitoring. Advised him to contact the office with further questions, he understood and requested that I leave the same information on his VM, I called back and left the message.

## 2020-10-14 ENCOUNTER — Telehealth: Payer: Self-pay | Admitting: Adult Health

## 2020-10-14 NOTE — Telephone Encounter (Signed)
Ophthalmology exam 09/17/2020 by Dr. Dione Booze   "Imp/Plan:  1.  Cerebrovascular disease OU. OCT/HVF next year.  We will send today's exam and VF to Ms. McCue (referring) & Ms. Cobb (PCP). CVA with bilateral left superior quadrantanopia.  Recommend no driving due to left homonymous defect.  Patient anticoagulated with Brilinta 2.  Other localized visual field defect OU. See #1 3.  Glaucoma suspect, high risk OU.  OCT/HVF next year.  Risk factors: RNFL thinning & GCA abnormality OU; thin CCT; age.  4.  Dry eye syndrome (evaporative) OU. AT.  Warm compress to lids 5.  Refractive error OU.  Patient is happy with her uncorrected distance vision and using OTC readers 6. pingucula OU. Stable.  Reassured patient of benign nature.  UV protection recommended.  Monitor. 7.  Nuclear sclerosis OU.  The cataracts are mild and have minimal impact on vision at this time."

## 2020-12-19 ENCOUNTER — Ambulatory Visit: Payer: Medicaid Other | Admitting: Cardiology

## 2021-02-23 NOTE — Progress Notes (Signed)
Office Visit    Patient Name: [name] Date of Encounter: 02/24/2021  PCP:  Fanny Dance, FNP   [facility]  Cardiologist:  Donato Schultz, MD  Advanced Practice Provider:  No care team member to display Electrophysiologist:  None      Chief Complaint    Travis Chavez is a 60 y.o. male with a hx of CVA, junctional rhythm, Mobitz second degree type 1, HTN presents today for follow up of arrhythmia   Past Medical History    Past Medical History:  Diagnosis Date   Hypertension    Stroke [facility])    History reviewed. No pertinent surgical history.  Allergies  Allergies  Allergen Reactions   Beta Adrenergic Blockers Nausea And Vomiting    SOB    History of Present Illness    Travis Chavez is a 60 y.o. male with a hx of  CVA, junctional rhythm, Mobitz second degree type 1, HTN last seen 05/26/20.  Previous echocardiogram 04/27/20 with LVEF 60-65%, mild LVH, gr1DD, trivial MR. Previous cardiac monitoring with second degree type 1 AV block and junctional rhythm.   He presents today for follow up with his wife. He and his [place] acres. He enjoys walking for exercise and also does the push mower. He has a blood pressure cuff at home but has not been checking consistently.  Notes he has been continuing to notice improvement in his vision since stroke. Reports no shortness of breath.  He notes dyspnea on exertion only with more than usual activity.. Reports no chest pain, pressure, or tightness. No edema, orthopnea, PND. Reports no palpitations.    EKGs/Labs/Other Studies Reviewed:   The following studies were reviewed today:  ECHO 04/27/2020  1. Left ventricular ejection fraction, by estimation, is 60 to 65%. The  left ventricle has normal function. There is mild left ventricular  hypertrophy. Left ventricular diastolic parameters are consistent with  Grade I diastolic dysfunction (impaired  relaxation).   2. The mitral valve is normal in  structure. Trivial mitral valve  regurgitation.   3. The aortic valve is normal in structure.   4. The inferior vena cava is normal in size with greater than 50%  respiratory variability, suggesting right atrial pressure of 3 mmHg.   Conclusion(s)/Recommendation(s): No intracardiac source of embolism  detected on this transthoracic study. A transesophageal echocardiogram is  recommended to exclude cardiac source of embolism if clinically indicated.   EKG:  EKG is  ordered today.  The ekg ordered today demonstrates NSR 62 bpm with first degree AV block PR 232  Recent Labs: 04/28/2020: ALT 16; Hemoglobin 13.7; Platelets 276 04/29/2020: BUN 15; Creatinine, Ser 1.20; Potassium 3.5; Sodium 139  Recent Lipid Panel    Component Value Date/Time   CHOL 96 (L) 09/11/2020 1125   TRIG 84 09/11/2020 1125   HDL 41 09/11/2020 1125   CHOLHDL 2.3 09/11/2020 1125   CHOLHDL 2.6 04/27/2020 0746   VLDL 14 04/27/2020 0746   LDLCALC 38 09/11/2020 1125    Home Medications   Current Meds  Medication Sig   amLODipine (NORVASC) 5 MG tablet Take 5 mg by mouth 2 (two) times daily.   atorvastatin (LIPITOR) 80 MG tablet Take 1 tablet (80 mg total) by mouth daily.   hydrALAZINE (APRESOLINE) 50 MG tablet Take 1 tablet (50 mg total) by mouth every 8 (eight) hours.   hydrochlorothiazide (HYDRODIURIL) 25 MG tablet Take 1 tablet (25 mg total) by mouth daily.  terazosin (HYTRIN) 2 MG capsule Take 2 mg by mouth 2 (two) times daily.   vitamin B-12 (CYANOCOBALAMIN) 1000 MCG tablet Take 1,000 mcg by mouth daily.   [DISCONTINUED] BRILINTA 90 MG TABS tablet Take 90 mg by mouth 2 (two) times daily.   [DISCONTINUED] furosemide (LASIX) 20 MG tablet Take 1 tablet (20 mg total) by mouth daily.   [DISCONTINUED] potassium chloride SA (KLOR-CON) 20 MEQ tablet Take 1 tablet (20 mEq total) by mouth daily.     Review of Systems      All other systems reviewed and are otherwise negative except as noted above.  Physical Exam     VS:  BP 132/80   Pulse 62   Ht 5\' 5"  (1.651 m)   Wt 214 lb 14.4 oz (97.5 kg)   SpO2 97%   BMI 35.76 kg/m  , BMI Body mass index is 35.76 kg/m.  Wt Readings from Last 3 Encounters:  02/24/21 214 lb 14.4 oz (97.5 kg)  09/11/20 227 lb (103 kg)  06/11/20 230 lb 9.6 oz (104.6 kg)     GEN: Well nourished, overweight, well developed, in no acute distress. HEENT: normal. Neck: Supple, no JVD, carotid bruits, or masses. Cardiac: RRR, no murmurs, rubs, or gallops. No clubbing, cyanosis, edema.  Radials/PT 2+ and equal bilaterally.  Respiratory:  Respirations regular and unlabored, clear to auscultation bilaterally. GI: Soft, nontender, nondistended. MS: No deformity or atrophy. Skin: Warm and dry, no rash. Neuro:  Strength and sensation are intact. Psych: Normal affect.  Assessment & Plan    History of CVA - Eyesight still improving.  Continue to follow with neurology.  Continue optimal BP control, optimal glucose control, statin, colon prep.  HLD, LDL goal <70 -continue atorvastatin 80 mg daily.  Most recent LDL at goal of less than 70  HTN - BP well controlled. Continue current antihypertensive regimen.  Refills provided.  History of second degree type 1 AV block -EKG today shows only first-degree AV block which is stable compared to previous.  No lightheadedness, dizziness, syncope.  Would avoid AV nodal blocking agents.  Disposition: Follow up in 6 month(s) with Dr. 08/09/20 or APP.  Signed, Anne Fu, NP 02/24/2021, 12:53 PM [facility]

## 2021-02-24 ENCOUNTER — Other Ambulatory Visit: Payer: Self-pay

## 2021-02-24 ENCOUNTER — Ambulatory Visit (INDEPENDENT_AMBULATORY_CARE_PROVIDER_SITE_OTHER): Payer: Medicaid Other | Admitting: Family

## 2021-02-24 ENCOUNTER — Encounter (HOSPITAL_BASED_OUTPATIENT_CLINIC_OR_DEPARTMENT_OTHER): Payer: Self-pay | Admitting: Family

## 2021-02-24 VITALS — BP 132/80 | HR 62 | Ht 65.0 in | Wt 214.9 lb

## 2021-02-24 DIAGNOSIS — E785 Hyperlipidemia, unspecified: Secondary | ICD-10-CM

## 2021-02-24 DIAGNOSIS — Z8673 Personal history of transient ischemic attack (TIA), and cerebral infarction without residual deficits: Secondary | ICD-10-CM | POA: Diagnosis not present

## 2021-02-24 DIAGNOSIS — I10 Essential (primary) hypertension: Secondary | ICD-10-CM

## 2021-02-24 DIAGNOSIS — R600 Localized edema: Secondary | ICD-10-CM

## 2021-02-24 LAB — CBC
Hematocrit: 45.3 % (ref 37.5–51.0)
Hemoglobin: 14.9 g/dL (ref 13.0–17.7)
MCH: 28.3 pg (ref 26.6–33.0)
MCHC: 32.9 g/dL (ref 31.5–35.7)
MCV: 86 fL (ref 79–97)
Platelets: 338 10*3/uL (ref 150–450)
RBC: 5.27 x10E6/uL (ref 4.14–5.80)
RDW: 11.9 % (ref 11.6–15.4)
WBC: 9 10*3/uL (ref 3.4–10.8)

## 2021-02-24 LAB — BASIC METABOLIC PANEL
BUN/Creatinine Ratio: 13 (ref 9–20)
BUN: 14 mg/dL (ref 6–24)
CO2: 22 mmol/L (ref 20–29)
Calcium: 9.4 mg/dL (ref 8.7–10.2)
Chloride: 103 mmol/L (ref 96–106)
Creatinine, Ser: 1.1 mg/dL (ref 0.76–1.27)
Glucose: 94 mg/dL (ref 65–99)
Potassium: 4 mmol/L (ref 3.5–5.2)
Sodium: 141 mmol/L (ref 134–144)
eGFR: 78 mL/min/{1.73_m2} (ref >59)

## 2021-02-24 MED ORDER — BRILINTA 90 MG PO TABS: 90.0000 mg | ORAL_TABLET | Freq: Two times a day (BID) | ORAL | 3 refills | Status: DC

## 2021-02-24 MED ORDER — POTASSIUM CHLORIDE CRYS ER 20 MEQ PO TBCR: 20.0000 meq | EXTENDED_RELEASE_TABLET | Freq: Every day | ORAL | 3 refills | Status: DC

## 2021-02-24 MED ORDER — FUROSEMIDE 20 MG PO TABS: 20.0000 mg | ORAL_TABLET | Freq: Every day | ORAL | 3 refills | Status: DC

## 2021-02-24 NOTE — Patient Instructions (Addendum)
Medication Instructions:  Continue your current medications.   *If you need a refill on your cardiac medications before your next appointment, please call your pharmacy*   Lab Work: Your physician recommends that you return for lab work in: BMP, CBC   If you have labs (blood work) drawn today and your tests are completely normal, you will receive your results only by: MyChart Message (if you have MyChart) OR A paper copy in the mail If you have any lab test that is abnormal or we need to change your treatment, we will call you to review the results.   Testing/Procedures: Your EKG was stable compared to previous. It shows sinus rhythm with a first degree AV block.    Follow-Up: At [facility], you and your health needs are our priority.  As part of our continuing mission to provide you with exceptional heart care, we have created designated Provider Care Teams.  These Care Teams include your primary Cardiologist (physician) and Advanced Practice Providers (APPs -  Physician Assistants and Nurse Practitioners) who all work together to provide you with the care you need, when you need it.  We recommend signing up for the patient portal called "MyChart".  Sign up information is provided on this After Visit Summary.  MyChart is used to connect with patients for Virtual Visits (Telemedicine).  Patients are able to view lab/test results, encounter notes, upcoming appointments, etc.  Non-urgent messages can be sent to your provider as well.   To learn more about what you can do with MyChart, go to [email or web address].    Your next appointment:   6 month(s)  The format for your next appointment:   In Person  Provider:   You may see Donato Schultz, MD or one of the following Advanced Practice Providers on your designated Care Team:   Nada Boozer, NP Alver Sorrow, NP    Other Instructions  [facility]'s phone number is: [phone]  Heart Healthy Diet  Recommendations: A low-salt diet is recommended. Meats should be grilled, baked, or boiled. Avoid fried foods. Focus on lean protein sources like fish or chicken with vegetables and fruits. The American Heart Association is a Chief Technology Officer!  Exercise recommendations: The American Heart Association recommends 150 minutes of moderate intensity exercise weekly. Try 30 minutes of moderate intensity exercise 4-5 times per week. This could include walking, jogging, or swimming.

## 2021-03-16 ENCOUNTER — Ambulatory Visit: Payer: Medicaid Other | Admitting: Adult Health

## 2021-05-07 ENCOUNTER — Encounter: Payer: Self-pay | Admitting: Cardiology

## 2021-06-07 IMAGING — MR MR HEAD W/O CM
9 of 13 series · 29 of 48 positions shown · non-contrast
Comparison: 02/02/2020

CLINICAL DATA: Episode of left-sided numbness and left vision loss

EXAM:
MRI HEAD WITHOUT CONTRAST
MRA HEAD WITHOUT CONTRAST
TECHNIQUE: Multiplanar, multiecho pulse sequences of the brain and surrounding
structures were obtained without intravenous contrast. Angiographic
images of the head were obtained using MRA technique without
contrast.

[Series 2: DWI · axial · 3.0mm · 0.94mm/px · z∈[-111,+30]mm · 6 of 100 slices shown (1 of 2)]
[im 1/100]
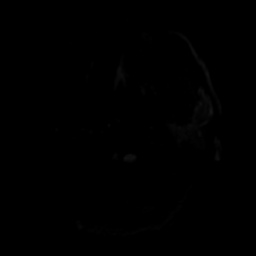
[im 20/100]
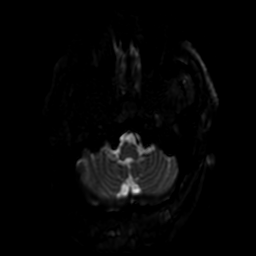
[im 40/100]
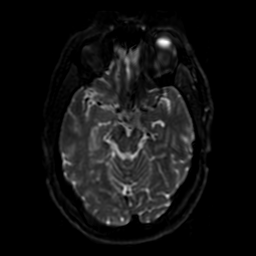
[im 60/100]
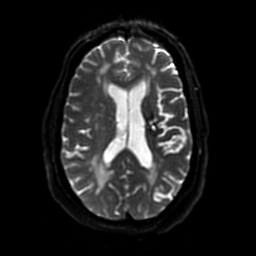
[im 80/100]
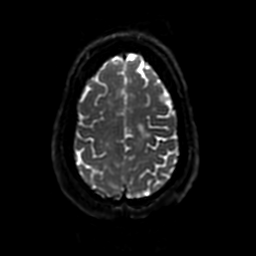
[im 100/100]
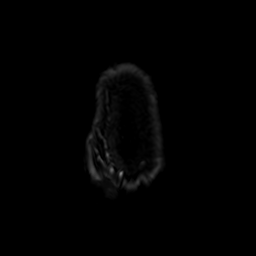

[Series 3: DWI · coronal · 4.0mm · 0.94mm/px · 5 of 78 slices shown (2 of 2)]
[im 1/78]
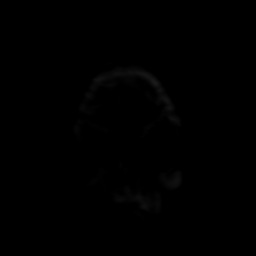
[im 20/78]
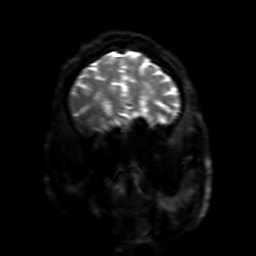
[im 39/78]
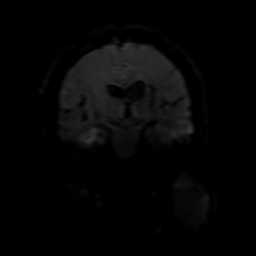
[im 58/78]
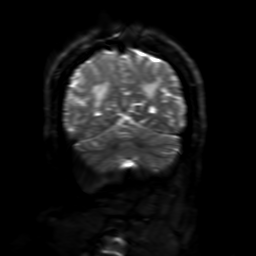
[im 78/78]
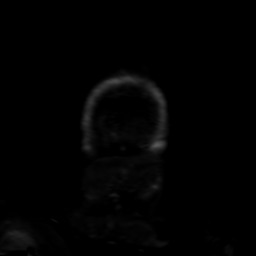

[Series 4: FLAIR · axial · 3.0mm · 0.45mm/px · 1 of 25 slices shown (1 of 2)]
[im 1/25]
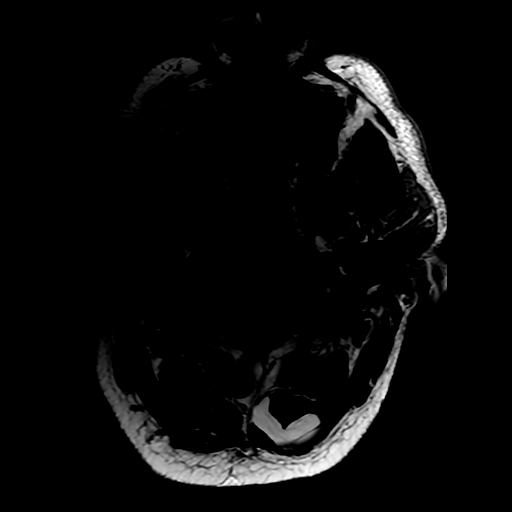

[Series 5: ax (id) · axial · 1.0mm · 0.43mm/px · z∈[-99,-11]mm · 8 of 184 slices shown]
[im 1/184]
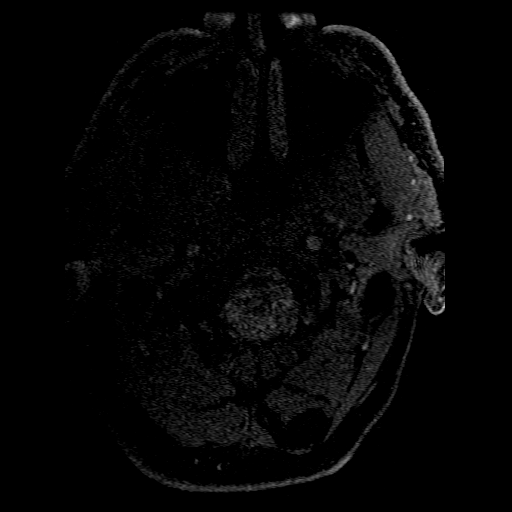
[im 37/184]
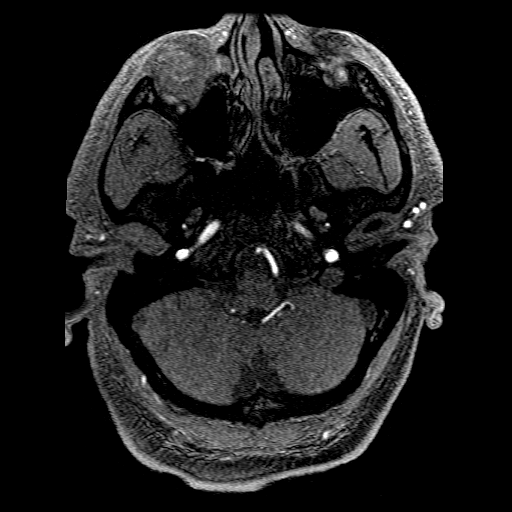
[im 55/184]
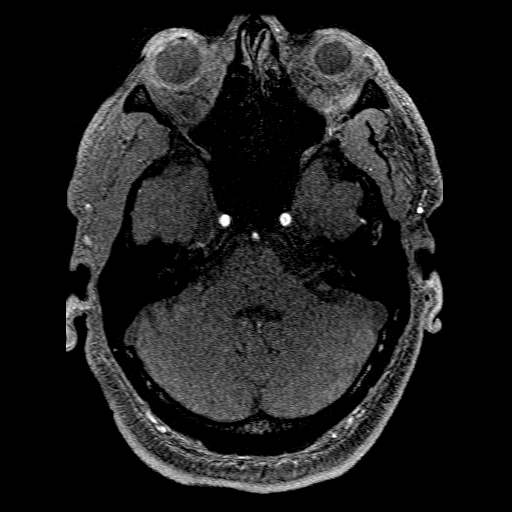
[im 74/184]
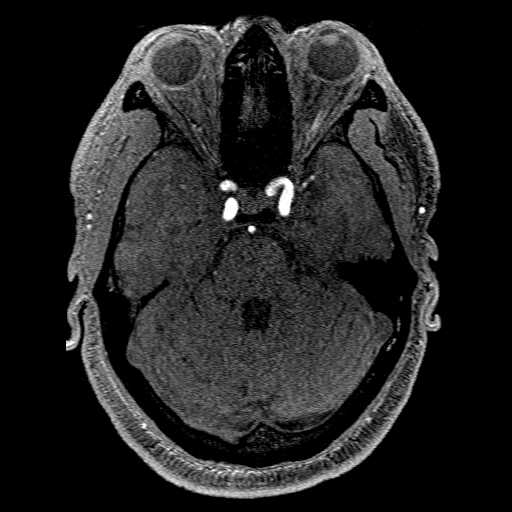
[im 110/184]
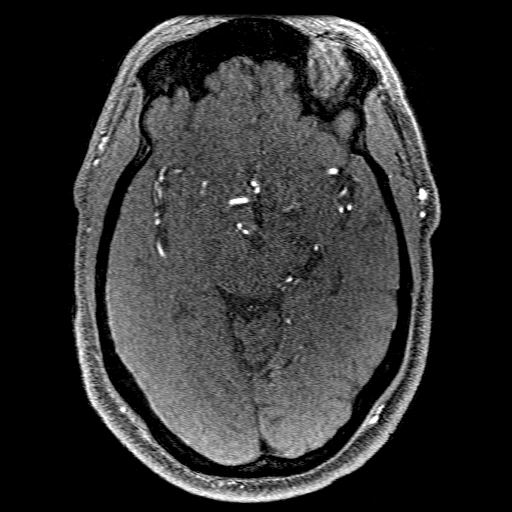
[im 129/184]
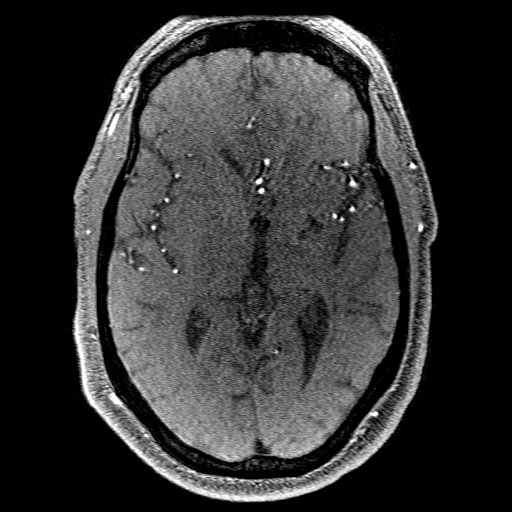
[im 147/184]
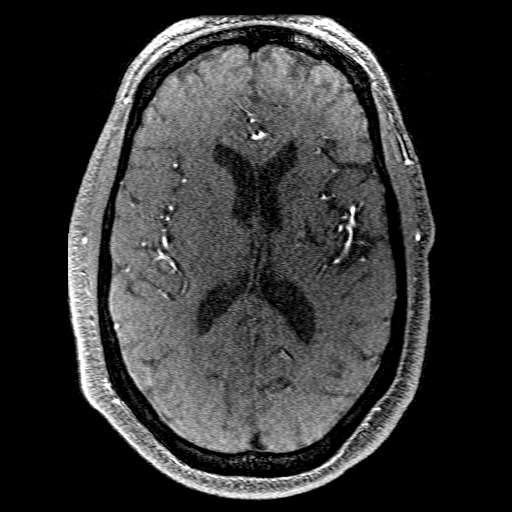
[im 184/184]
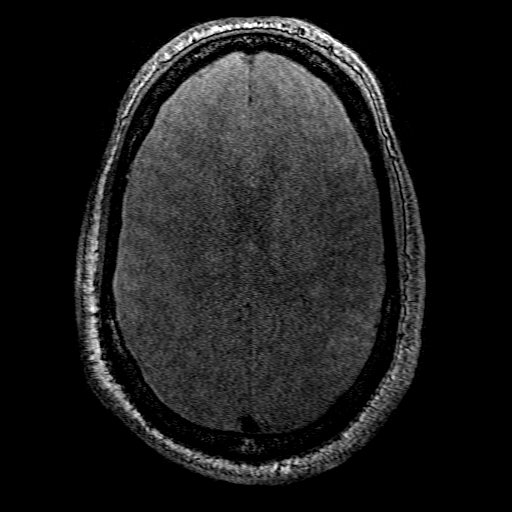

[Series 7: FLAIR · sagittal · 5.0mm · 0.47mm/px · 1 of 25 slices shown (2 of 2)]
[im 1/25]
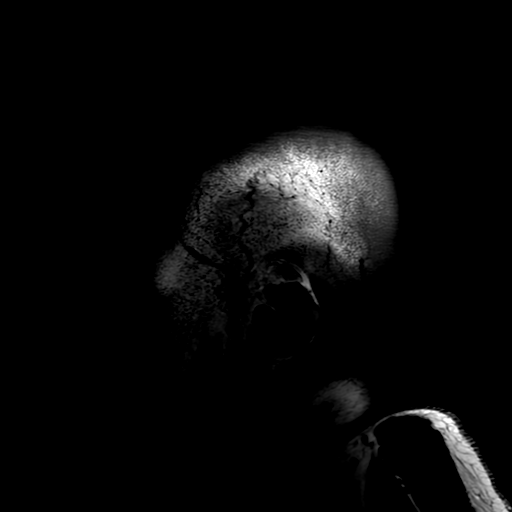

[Series 8: T2 · axial · 5.0mm · 0.47mm/px · 1 of 25 slices shown (1 of 2)]
[im 1/25]
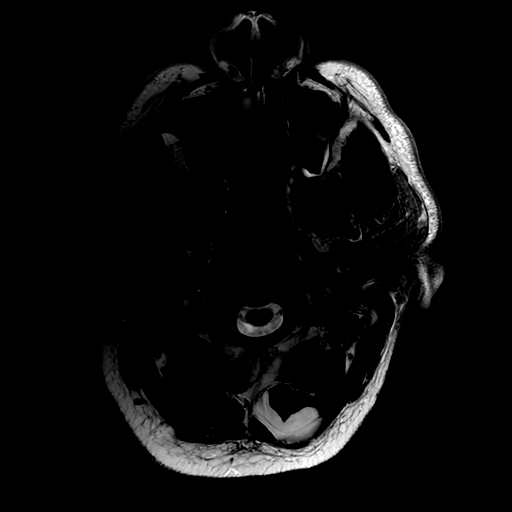

[Series 10: T2 · coronal · 5.0mm · 0.39mm/px · 2 of 33 slices shown (2 of 2)]
[im 1/33]
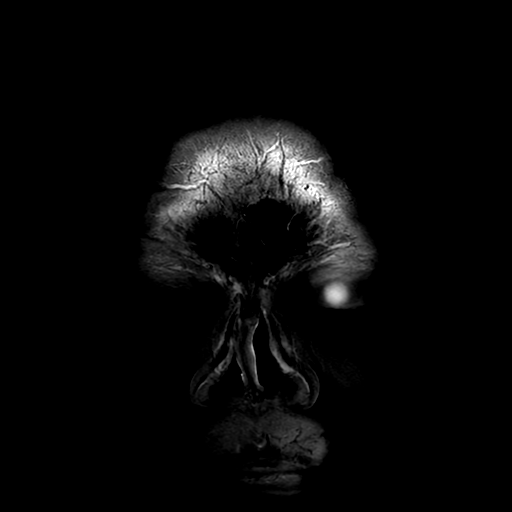
[im 33/33]
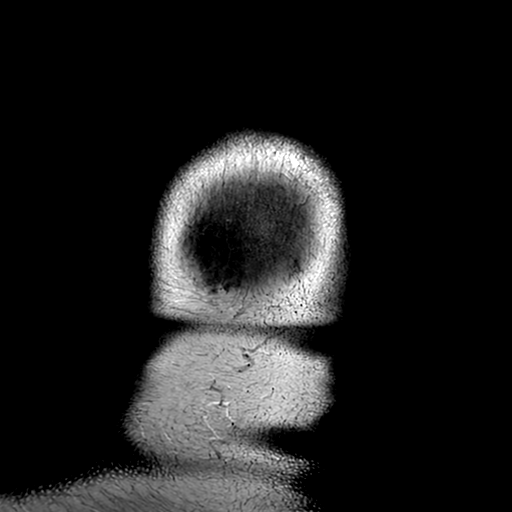

[Series 250: ADC · axial · 3.0mm · 0.94mm/px · z∈[-111,+30]mm · 3 of 50 slices shown (1 of 2)]
[im 1/50]
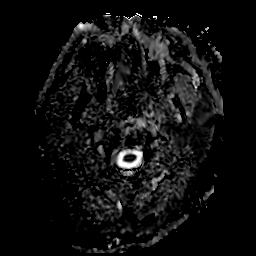
[im 25/50]
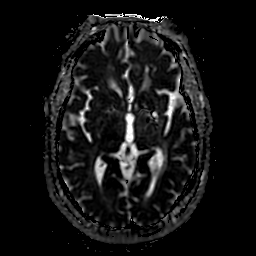
[im 50/50]
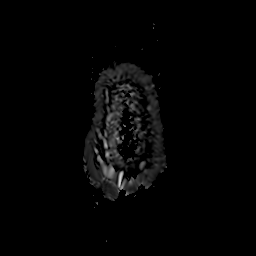

[Series 350: ADC · coronal · 4.0mm · 0.94mm/px · 2 of 39 slices shown (2 of 2)]
[im 1/39]
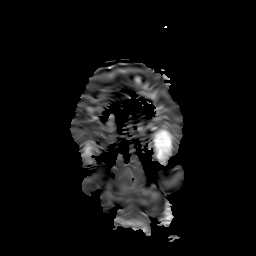
[im 39/39]
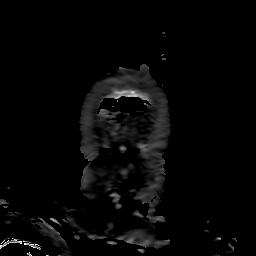

[29 of 48 positions shown; findings below may reference images not displayed]

FINDINGS: MRI HEAD

Brain: There are multiple small foci of restricted diffusion in the
right PCA territory involving occipital lobe, adjacent corpus
callosum splenium, medial right temporal lobe including the
hippocampus, and right thalamus.

Small focus of diffusion hyperintensity with ADC iso- and
hyperintensity along the left aspect of the splenium of the corpus
callosum.

There is a chronic infarct of the left corona radiata and basal
ganglia with chronic blood products. Few additional small foci of
susceptibility are present in the cerebral white matter and right
cerebellum likely reflecting chronic microhemorrhages. There are
chronic small vessel infarcts of the ventral left frontal white
matter and right cerebellum. Additional patchy and confluent T2
hyperintensity in the supratentorial white matter is nonspecific but
probably reflects moderate to advanced chronic microvascular
ischemic changes.

There is no intracranial mass or significant mass effect. There is
no hydrocephalus or extra-axial fluid collection.

Vascular: Major vessel flow voids at the skull base are preserved.

Skull and upper cervical spine: Normal marrow signal is preserved.

Sinuses/Orbits: Minor mucosal thickening.  Orbits are unremarkable.

Other: Sella is unremarkable.  Mastoid air cells are clear.

MRA HEAD

Intracranial internal carotid arteries are patent. Middle and
anterior cerebral arteries are patent. Intracranial vertebral
arteries, basilar artery, and left posterior cerebral arteries are
patent. There is occlusion of the right P2 PCA. Bilateral posterior
communicating arteries are present. There is no aneurysm.
IMPRESSION: Multiple small acute infarcts of the right PCA territory as detailed
above. Occlusion of the right P2 PCA (patent right P1 PCA and PCOM).

Probable small subacute infarct involving the left splenium of the
corpus callosum.

Moderate to advanced chronic microvascular ischemic changes. Few
chronic infarcts as described.

These results were called by telephone at the time of interpretation
on 04/27/2020 at [DATE] to provider LUSINE JIM , who verbally
acknowledged these results.

## 2021-06-07 IMAGING — MR MR MRA HEAD W/O CM
9 of 13 series · 29 of 48 positions shown · non-contrast
Comparison: 02/02/2020

CLINICAL DATA: Episode of left-sided numbness and left vision loss

EXAM:
MRI HEAD WITHOUT CONTRAST
MRA HEAD WITHOUT CONTRAST
TECHNIQUE: Multiplanar, multiecho pulse sequences of the brain and surrounding
structures were obtained without intravenous contrast. Angiographic
images of the head were obtained using MRA technique without
contrast.

[Series 2: DWI · axial · 3.0mm · 0.94mm/px · z∈[-111,+30]mm · 6 of 100 slices shown (1 of 2)]
[im 1/100]
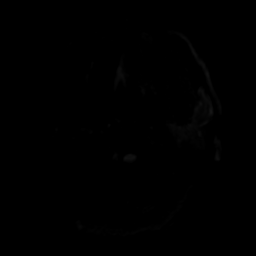
[im 20/100]
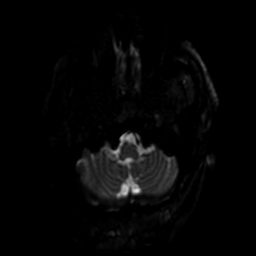
[im 40/100]
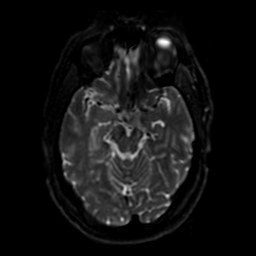
[im 60/100]
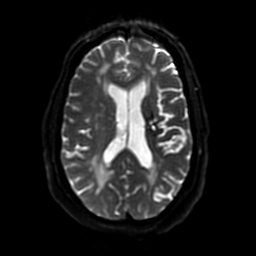
[im 80/100]
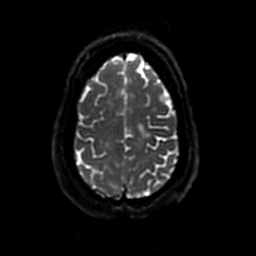
[im 100/100]
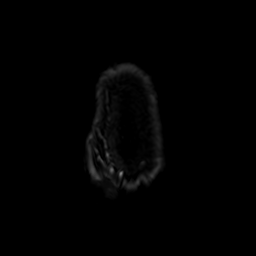

[Series 3: DWI · coronal · 4.0mm · 0.94mm/px · 5 of 78 slices shown (2 of 2)]
[im 1/78]
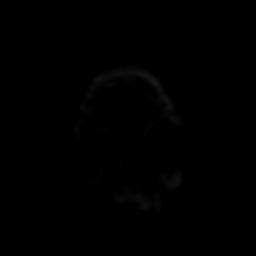
[im 20/78]
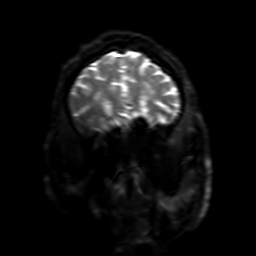
[im 39/78]
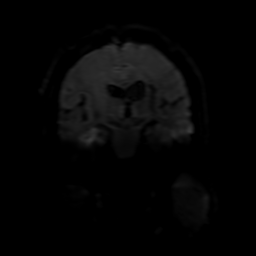
[im 58/78]
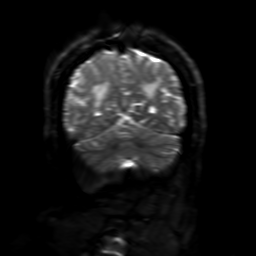
[im 78/78]
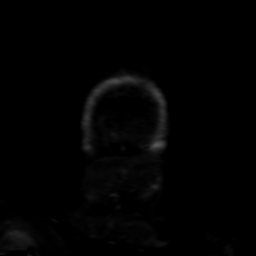

[Series 4: FLAIR · axial · 3.0mm · 0.45mm/px · 1 of 25 slices shown (1 of 2)]
[im 1/25]
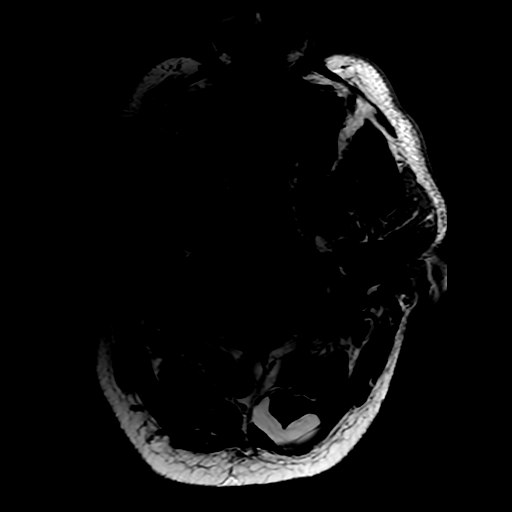

[Series 5: ax (id) · axial · 1.0mm · 0.43mm/px · z∈[-99,-11]mm · 8 of 184 slices shown]
[im 1/184]
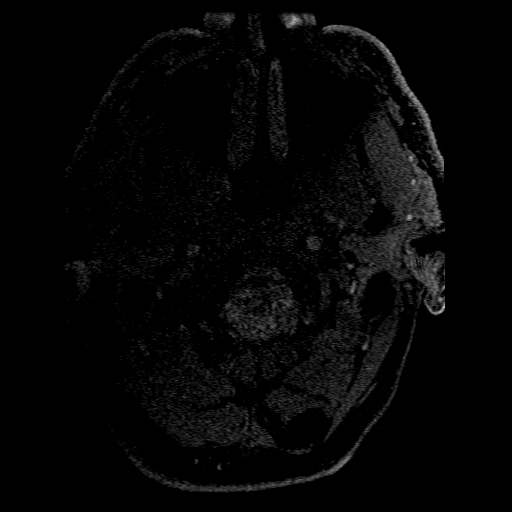
[im 37/184]
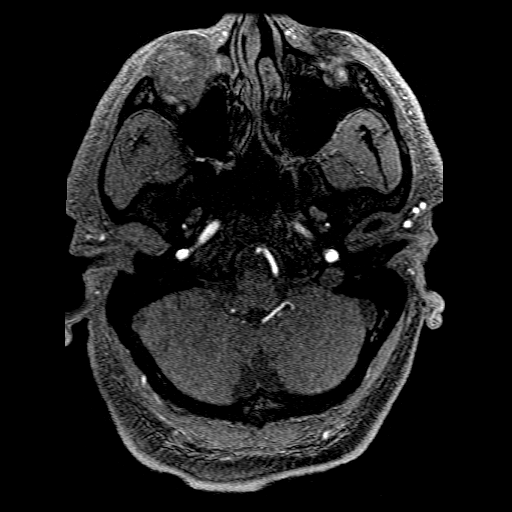
[im 55/184]
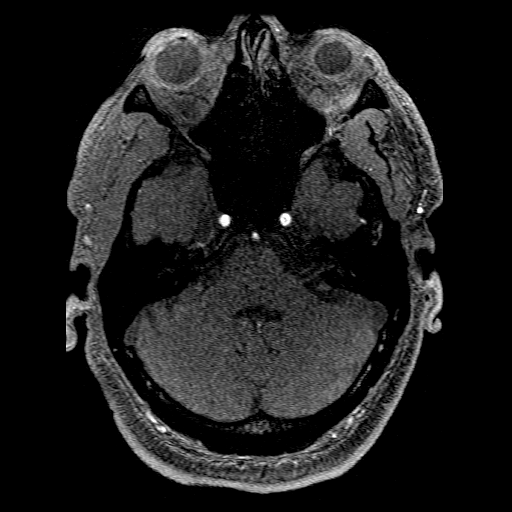
[im 74/184]
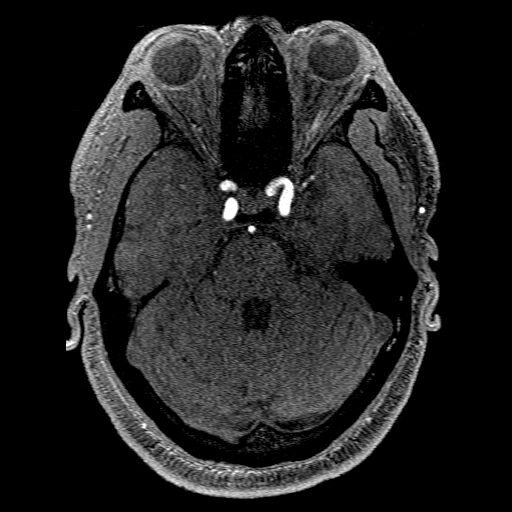
[im 110/184]
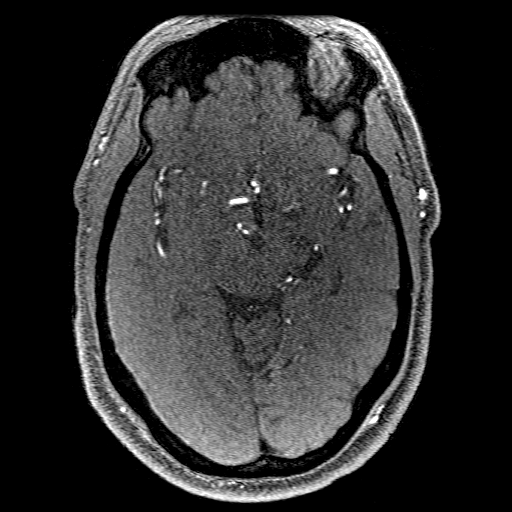
[im 129/184]
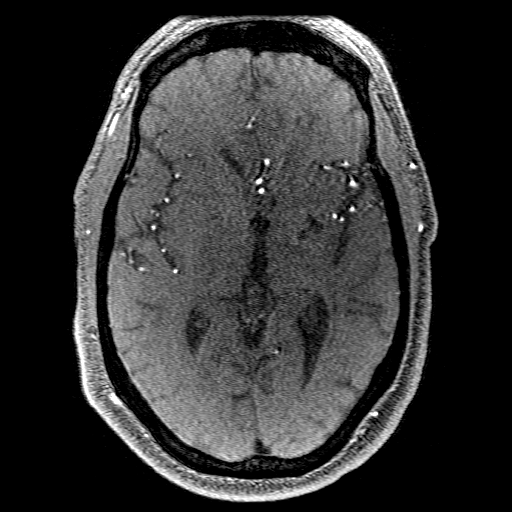
[im 147/184]
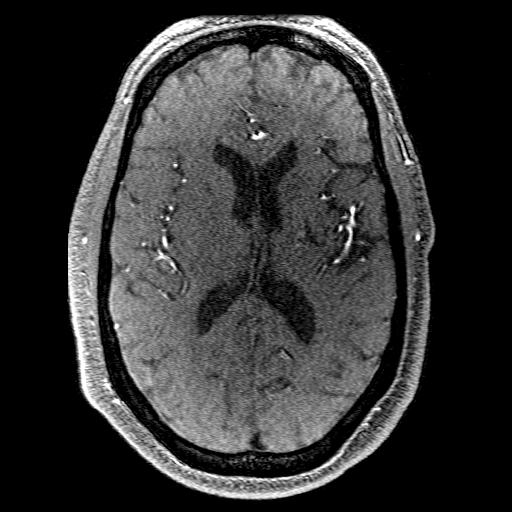
[im 184/184]
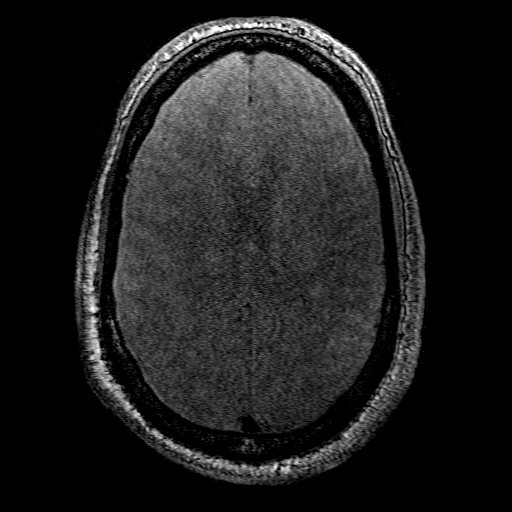

[Series 7: FLAIR · sagittal · 5.0mm · 0.47mm/px · 1 of 25 slices shown (2 of 2)]
[im 1/25]
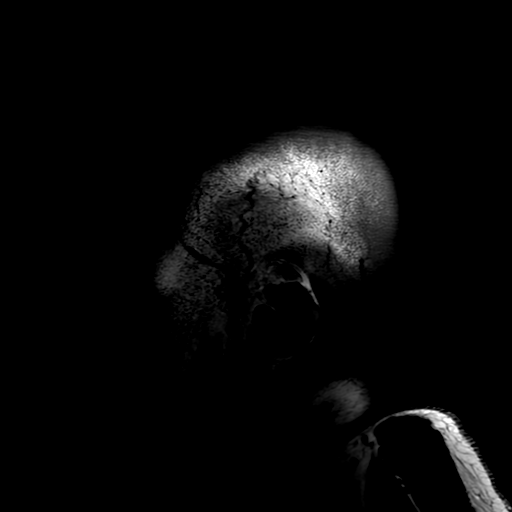

[Series 8: T2 · axial · 5.0mm · 0.47mm/px · 1 of 25 slices shown (1 of 2)]
[im 1/25]
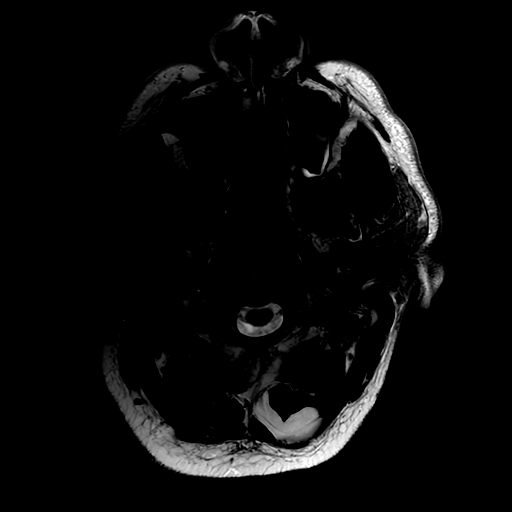

[Series 10: T2 · coronal · 5.0mm · 0.39mm/px · 2 of 33 slices shown (2 of 2)]
[im 1/33]
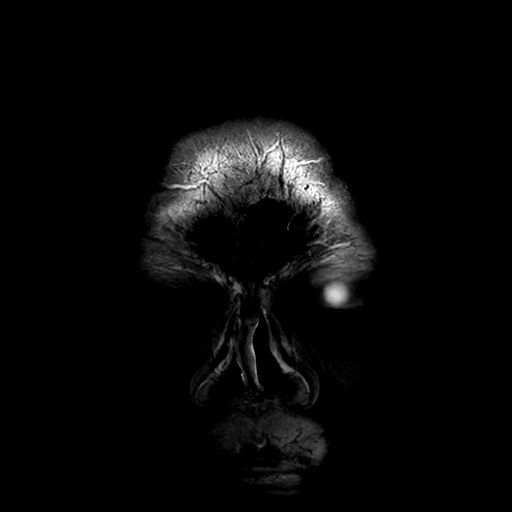
[im 33/33]
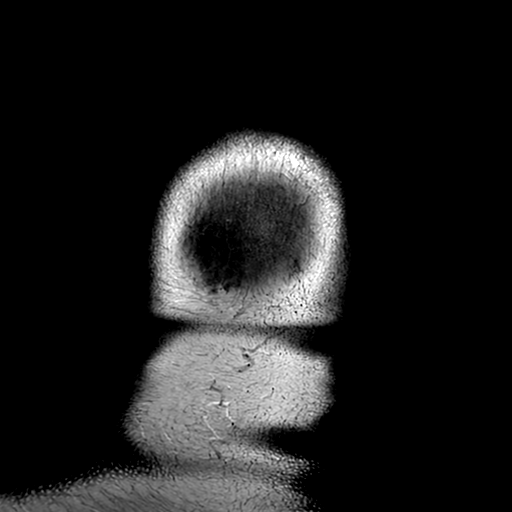

[Series 250: ADC · axial · 3.0mm · 0.94mm/px · z∈[-111,+30]mm · 3 of 50 slices shown (1 of 2)]
[im 1/50]
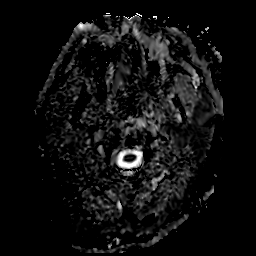
[im 25/50]
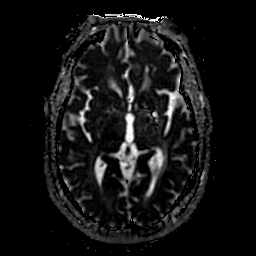
[im 50/50]
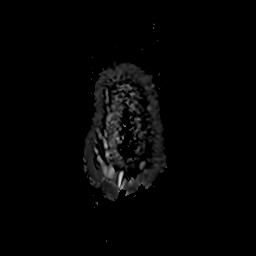

[Series 350: ADC · coronal · 4.0mm · 0.94mm/px · 2 of 39 slices shown (2 of 2)]
[im 1/39]
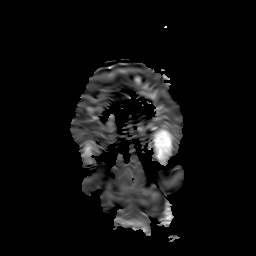
[im 39/39]
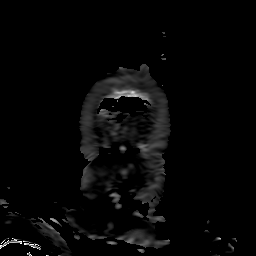

[29 of 48 positions shown; findings below may reference images not displayed]

FINDINGS: MRI HEAD

Brain: There are multiple small foci of restricted diffusion in the
right PCA territory involving occipital lobe, adjacent corpus
callosum splenium, medial right temporal lobe including the
hippocampus, and right thalamus.

Small focus of diffusion hyperintensity with ADC iso- and
hyperintensity along the left aspect of the splenium of the corpus
callosum.

There is a chronic infarct of the left corona radiata and basal
ganglia with chronic blood products. Few additional small foci of
susceptibility are present in the cerebral white matter and right
cerebellum likely reflecting chronic microhemorrhages. There are
chronic small vessel infarcts of the ventral left frontal white
matter and right cerebellum. Additional patchy and confluent T2
hyperintensity in the supratentorial white matter is nonspecific but
probably reflects moderate to advanced chronic microvascular
ischemic changes.

There is no intracranial mass or significant mass effect. There is
no hydrocephalus or extra-axial fluid collection.

Vascular: Major vessel flow voids at the skull base are preserved.

Skull and upper cervical spine: Normal marrow signal is preserved.

Sinuses/Orbits: Minor mucosal thickening.  Orbits are unremarkable.

Other: Sella is unremarkable.  Mastoid air cells are clear.

MRA HEAD

Intracranial internal carotid arteries are patent. Middle and
anterior cerebral arteries are patent. Intracranial vertebral
arteries, basilar artery, and left posterior cerebral arteries are
patent. There is occlusion of the right P2 PCA. Bilateral posterior
communicating arteries are present. There is no aneurysm.
IMPRESSION: Multiple small acute infarcts of the right PCA territory as detailed
above. Occlusion of the right P2 PCA (patent right P1 PCA and PCOM).

Probable small subacute infarct involving the left splenium of the
corpus callosum.

Moderate to advanced chronic microvascular ischemic changes. Few
chronic infarcts as described.

These results were called by telephone at the time of interpretation
on 04/27/2020 at [DATE] to provider LUSINE JIM , who verbally
acknowledged these results.

## 2021-07-10 ENCOUNTER — Other Ambulatory Visit: Payer: Self-pay | Admitting: Ophthalmology

## 2021-07-10 DIAGNOSIS — H53483 Generalized contraction of visual field, bilateral: Secondary | ICD-10-CM

## 2021-07-20 ENCOUNTER — Ambulatory Visit
Admission: RE | Admit: 2021-07-20 | Discharge: 2021-07-20 | Disposition: A | Discharge status: Home / Self Care | Payer: Medicaid Other | Source: Ambulatory Visit | Attending: Ophthalmology | Admitting: Ophthalmology

## 2021-07-20 ENCOUNTER — Other Ambulatory Visit: Payer: Self-pay

## 2021-07-20 DIAGNOSIS — H53483 Generalized contraction of visual field, bilateral: Secondary | ICD-10-CM | POA: Insufficient documentation

## 2021-07-20 MED ORDER — GADOBUTROL 1 MMOL/ML IV SOLN
9.0000 mL | Freq: Once | INTRAVENOUS | Status: AC | PRN
  Administered 2021-07-20: 9 mL via INTRAVENOUS

## 2021-07-21 ENCOUNTER — Telehealth: Payer: Self-pay | Admitting: Adult Health

## 2021-07-21 NOTE — Telephone Encounter (Signed)
Contacted spouse back, LVM rq call back.  

## 2021-07-21 NOTE — Telephone Encounter (Signed)
Pt's wife is asking for a call to discuss [facility] ([facility]) had pt to have another MRI this week, wife is asking for a call to discuss stroke pt had

## 2021-07-21 NOTE — Telephone Encounter (Signed)
Contacted spouse back, she stated his eye dr did another MRI due to his vision worsening and was advised to inform Travis Chavez. Pt asked to schedule appt after n/s 03/16/21. Got pt scheduled 07/27/21 915 advised to arrive 15-30 mins early, spouse understood and was appreciative.

## 2021-07-21 NOTE — Telephone Encounter (Signed)
Can we please get information from ophthalmologist - recent MRI did not show any acute findings. Thank you.

## 2021-07-21 NOTE — Telephone Encounter (Signed)
Contacted Ophthalmologist office, spoke to [place], Rolly Pancake, MD nurse Rushie Goltz and [name], tech. There was no notes regarding specifically why he was referred back to [place]. [name] stated it was just for normal routine FU, no new stroke concerns was found.

## 2021-07-22 NOTE — Telephone Encounter (Signed)
Contacted spouse again, no answer

## 2021-07-23 ENCOUNTER — Other Ambulatory Visit: Payer: Self-pay | Admitting: Ophthalmology

## 2021-07-23 DIAGNOSIS — H53483 Generalized contraction of visual field, bilateral: Secondary | ICD-10-CM

## 2021-07-27 ENCOUNTER — Ambulatory Visit: Payer: Medicaid Other | Admitting: Adult Health

## 2021-07-27 ENCOUNTER — Encounter: Payer: Self-pay | Admitting: Adult Health
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
## 2021-07-30 ENCOUNTER — Other Ambulatory Visit: Payer: Self-pay

## 2021-07-30 ENCOUNTER — Ambulatory Visit
Admission: RE | Admit: 2021-07-30 | Discharge: 2021-07-30 | Disposition: A | Discharge status: Home / Self Care | Payer: Medicaid Other | Source: Ambulatory Visit | Attending: Ophthalmology | Admitting: Ophthalmology

## 2021-07-30 DIAGNOSIS — H53483 Generalized contraction of visual field, bilateral: Secondary | ICD-10-CM | POA: Diagnosis present

## 2021-07-30 MED ORDER — GADOBUTROL 1 MMOL/ML IV SOLN
9.0000 mL | Freq: Once | INTRAVENOUS | Status: AC | PRN
  Administered 2021-07-30: 9 mL via INTRAVENOUS

## 2021-08-21 ENCOUNTER — Ambulatory Visit: Payer: Medicaid Other | Admitting: Cardiology

## 2021-09-04 ENCOUNTER — Other Ambulatory Visit
Admission: RE | Admit: 2021-09-04 | Discharge: 2021-09-04 | Disposition: A | Discharge status: Home / Self Care | Payer: Medicaid Other | Source: Ambulatory Visit | Attending: Ophthalmology | Admitting: Ophthalmology

## 2021-09-04 DIAGNOSIS — H53483 Generalized contraction of visual field, bilateral: Secondary | ICD-10-CM | POA: Diagnosis present

## 2021-09-04 LAB — C-REACTIVE PROTEIN: CRP: 0.6 mg/dL (ref <1.0)

## 2021-09-04 LAB — PLATELET COUNT: Platelets: 302 10*3/uL (ref 150–400)

## 2021-09-04 LAB — SEDIMENTATION RATE: Sed Rate: 7 mm/hr (ref 0–20)

## 2021-09-08 ENCOUNTER — Ambulatory Visit: Payer: Medicaid Other | Admitting: Cardiology

## 2021-09-08 NOTE — Progress Notes (Incomplete)
?Cardiology Office Note:   ? ?Date:  09/08/2021  ? ?ID:  Travis Chavez, DOB [birth year], MRN [id number] ? ?PCP:  Jerel Shepherd, FNP  ?[facility] Cardiologist:  Candee Furbish, MD  ?[facility] Electrophysiologist:  None  ? ?Referring MD: Jerel Shepherd, FNP  ? ? ? ?History of Present Illness:   ? ?Travis Chavez is a 61 y.o. male here for the follow-up of prior stroke, junctional beats, winky block phenomenon. ? ?Since his last visit, he followed up with Laurann Montana, NP on 02/24/2021 where he was doing well overall. He noted some DOE with more than usual activity. Also reported improvement in his vision since his stroke. ? ?Echo normal EF. ? 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 ? ?Was admitted again with stroke. Lost vision in his left eye left hand was numb felt nauseous. After about 3 hours or so he started to slowly regain some vision ? ?Back in August 2021 had similar symptoms ? ?Today,  ? ?He denies any palpitations, chest pain, shortness of breath, or peripheral edema. No lightheadedness, headaches, syncope, orthopnea, or PND. ? ?(+) ? ?Past Medical History:  ?Diagnosis Date  ? Hypertension   ? Stroke [facility])   ? ? ?No past surgical history on file. ? ?Current Medications: ?No outpatient medications have been marked as taking for the 09/08/21 encounter (Appointment) with Jerline Pain, MD.  ?  ? ?Allergies:   Beta adrenergic blockers  ? ?Social History  ? ?Socioeconomic History  ? Marital status: Married  ?  Spouse name: Not on file  ? Number of children: Not on file  ? Years of education: Not on file  ? Highest education level: Not on file  ?Occupational History  ? Not on file  ?Tobacco Use  ? Smoking status: Never  ? Smokeless tobacco: Never  ?Substance and Sexual Activity  ? Alcohol use: No  ?  Alcohol/week: 0.0 standard drinks   ? Drug use: No  ? Sexual activity: Not on file  ?Other Topics Concern  ? Not on file  ?Social History Narrative  ? Not on file  ? ?Social Determinants of Health  ? ?Financial Resource Strain: Not on file  ?Food Insecurity: Not on file  ?Transportation Needs: Not on file  ?Physical Activity: Not on file  ?Stress: Not on file  ?Social Connections: Not on file  ?  ? ?Family History: ? ?ROS:   ?Please see the history of present illness.    ? ?All other systems reviewed and are negative. ? ?EKGs/Labs/Other Studies Reviewed:   ? ?The following studies were reviewed today: ? ?MRA Neck 07/30/2021: ?FINDINGS: ?Common origin of the innominate and left common carotid arteries. ?The visualized aortic arch is normal in caliber. No significant ?innominate or proximal subclavian artery stenosis. ?  ?The common carotid and internal carotid arteries are patent within ?the neck without appreciable stenosis. ?  ?Vertebral arteries codominant and patent within the neck without ?appreciable stenosis. ?  ?IMPRESSION: ?The common carotid, internal carotid and vertebral arteries are ?patent within the neck without appreciable stenosis. ? ? ?ECHO 04/27/2020 ? 1. Left ventricular ejection fraction, by estimation, is 60 to 65%. The  ?left ventricle has normal function. There is mild left ventricular  ?hypertrophy. Left ventricular diastolic parameters are consistent with  ?Grade I diastolic dysfunction (impaired  ?relaxation).  ? 2. The  mitral valve is normal in structure. Trivial mitral valve  ?regurgitation.  ? 3. The aortic valve is normal in structure.  ? 4. The inferior vena cava is normal in size with greater than 50%  ?respiratory variability, suggesting right atrial pressure of 3 mmHg.  ? ?Conclusion(s)/Recommendation(s): No intracardiac source of embolism  ?detected on this transthoracic study. A transesophageal echocardiogram is  ?recommended to exclude cardiac source of embolism if clinically indicated.  ? ?EKG:   EKG is personally  reviewed. ?09/08/2021: Sinus ***. Rate *** bpm. ?02/24/2021 Laurann Montana, NP): NSR 62 bpm with first degree AV block PR 232. ?Prior shows 1st degree AVB ? ?Recent Labs: ?02/24/2021: BUN 14; Creatinine, Ser 1.10; Hemoglobin 14.9; Potassium 4.0; Sodium 141 ?09/04/2021: Platelets 302  ?Recent Lipid Panel ?   ?Component Value Date/Time  ? CHOL 96 (L) 09/11/2020 1125  ? TRIG 84 09/11/2020 1125  ? HDL 41 09/11/2020 1125  ? CHOLHDL 2.3 09/11/2020 1125  ? CHOLHDL 2.6 04/27/2020 0746  ? VLDL 14 04/27/2020 0746  ? LDLCALC 38 09/11/2020 1125  ? ? ? ?Risk Assessment/Calculations:   ?  ? ? ?Physical Exam:   ? ?VS:  There were no vitals taken for this visit.   ? ?Wt Readings from Last 3 Encounters:  ?02/24/21 214 lb 14.4 oz (97.5 kg)  ?09/11/20 227 lb (103 kg)  ?06/11/20 230 lb 9.6 oz (104.6 kg)  ?  ? ?GEN:  Well nourished, well developed in no acute distress ?HEENT: ***Bilateral decreased vision ?NECK: No JVD; No carotid bruits ?LYMPHATICS: No lymphadenopathy ?CARDIAC: RRR, no murmurs, rubs, gallops ?RESPIRATORY:  Clear to auscultation without rales, wheezing or rhonchi  ?ABDOMEN: Soft, non-tender, non-distended ?MUSCULOSKELETAL:  No edema; No deformity  ?SKIN: Warm and dry ?NEUROLOGIC:  Alert and oriented x 3 ?PSYCHIATRIC:  Normal affect  ? ?ASSESSMENT:   ? ?No diagnosis found. ? ?PLAN:   ? ?In order of problems listed above: ? ?No problem-specific Assessment & Plan notes found for this encounter. ? ?Occipital stroke, acute right PCA territory ischemic infarct intracranial atherosclerosis with right PCA occlusion ?-Bilateral eye vision weakness ?-Started on Brilinta with the aspirin. ?-Echocardiogram reviewed from hospitalization in late November 2021 that showed no evidence of embolic source on transthoracic. He did not have a TEE done at that time. ?-If neurology feels as though he needs to have one we will be happy to help facilitate. ? ?Recommended Brilinta and aspirin together for 3 months and then Brilinta  alone. ? ?Hyperlipidemia ?-Continue with high intensity atorvastatin 80 mg. LDL 44. Excellent. Goal is less than 70. No myalgias. Continue with current medical management. ? ?Essential hypertension ?-Excellent control today. Continue with current medication management. Medications reviewed as above. ? ?  ?Follow-up: *** months. ? ?Medication Adjustments/Labs and Tests Ordered: ?Current medicines are reviewed at length with the patient today.  Concerns regarding medicines are outlined above.  ? ?No orders of the defined types were placed in this encounter. ? ?No orders of the defined types were placed in this encounter. ? ?There are no Patient Instructions on file for this visit.  ? ?I,Mathew Stumpf,acting as a Education administrator for [employer], MD.,have documented all relevant documentation on the behalf of Candee Furbish, MD,as directed by  Candee Furbish, MD while in the presence of Candee Furbish, MD. ? ?*** ? ?Signed, ?Madelin Rear  ?09/08/2021 7:27 AM    ?[facility] ?

## 2021-09-24 ENCOUNTER — Other Ambulatory Visit
Admission: RE | Admit: 2021-09-24 | Discharge: 2021-09-24 | Disposition: A | Discharge status: Home / Self Care | Payer: Medicaid Other | Source: Ambulatory Visit | Attending: Ophthalmology | Admitting: Ophthalmology

## 2021-09-24 DIAGNOSIS — H5340 Unspecified visual field defects: Secondary | ICD-10-CM | POA: Diagnosis not present

## 2021-09-24 DIAGNOSIS — G459 Transient cerebral ischemic attack, unspecified: Secondary | ICD-10-CM | POA: Diagnosis present

## 2021-09-24 LAB — CBC WITH DIFFERENTIAL/PLATELET
Abs Immature Granulocytes: 0.02 10*3/uL (ref 0.00–0.07)
Basophils Absolute: 0 10*3/uL (ref 0.0–0.1)
Basophils Relative: 0 %
Eosinophils Absolute: 0.2 10*3/uL (ref 0.0–0.5)
Eosinophils Relative: 2 %
HCT: 42 % (ref 39.0–52.0)
Hemoglobin: 13.9 g/dL (ref 13.0–17.0)
Immature Granulocytes: 0 %
Lymphocytes Relative: 26 %
Lymphs Abs: 2.3 10*3/uL (ref 0.7–4.0)
MCH: 28.3 pg (ref 26.0–34.0)
MCHC: 33.1 g/dL (ref 30.0–36.0)
MCV: 85.4 fL (ref 80.0–100.0)
Monocytes Absolute: 0.7 10*3/uL (ref 0.1–1.0)
Monocytes Relative: 7 %
Neutro Abs: 5.8 10*3/uL (ref 1.7–7.7)
Neutrophils Relative %: 65 %
Platelets: 364 10*3/uL (ref 150–400)
RBC: 4.92 MIL/uL (ref 4.22–5.81)
RDW: 13.2 % (ref 11.5–15.5)
WBC: 8.9 10*3/uL (ref 4.0–10.5)
nRBC: 0 % (ref 0.0–0.2)

## 2021-09-24 LAB — VITAMIN B12: Vitamin B-12: 416 pg/mL (ref 180–914)

## 2021-09-24 LAB — C-REACTIVE PROTEIN: CRP: 0.6 mg/dL (ref <1.0)

## 2021-09-24 LAB — VITAMIN D 25 HYDROXY (VIT D DEFICIENCY, FRACTURES): Vit D, 25-Hydroxy: 32.39 ng/mL (ref 30–100)

## 2021-09-24 LAB — TSH: TSH: 0.986 u[IU]/mL (ref 0.350–4.500)

## 2021-09-24 LAB — T4, FREE: Free T4: 0.75 ng/dL (ref 0.61–1.12)

## 2021-09-24 LAB — SEDIMENTATION RATE: Sed Rate: 82 mm/hr — ABNORMAL HIGH (ref 0–20)

## 2021-09-25 LAB — T3, FREE: T3, Free: 3.4 pg/mL (ref 2.0–4.4)

## 2021-09-26 LAB — VITAMIN E
Vitamin E (Alpha Tocopherol): 7 mg/L (ref 7.0–25.1)
Vitamin E(Gamma Tocopherol): 1.2 mg/L (ref 0.5–5.5)

## 2021-09-26 LAB — VITAMIN A: Vitamin A (Retinoic Acid): 44.2 ug/dL (ref 20.1–62.0)

## 2021-09-27 LAB — VITAMIN B6: Vitamin B6: 4.8 ug/L (ref 3.4–65.2)

## 2021-09-29 LAB — VITAMIN B1: Vitamin B1 (Thiamine): 162.4 nmol/L (ref 66.5–200.0)

## 2021-09-30 LAB — MISC LABCORP TEST (SEND OUT): Labcorp test code: 123220

## 2021-12-11 ENCOUNTER — Encounter: Payer: Self-pay | Admitting: Cardiology

## 2021-12-11 NOTE — Telephone Encounter (Signed)
Error

## 2021-12-14 ENCOUNTER — Telehealth: Payer: Self-pay | Admitting: *Deleted

## 2021-12-14 NOTE — Telephone Encounter (Signed)
   Pre-operative Risk Assessment    Patient Name: Travis Chavez  DOB: 01/16/1961 MRN: [id number]     Request for Surgical Clearance    Procedure:  Dental Extraction - Amount of Teeth to be Pulled:  1 TOOTH FOR SIMPLE EXTRACTION  Date of Surgery:  Clearance 12/25/21                                 Surgeon:  DR. Jerolyn Center, DDS Surgeon's Group or Practice Name:  [facility] Phone number:  [phone] Fax number:  [phone]   Type of Clearance Requested:   - Medical  - Pharmacy:  [facility] (Brilinta)   DR. Vickey Sages, DDS HAS ALREADY PRESCRIBED THE PT PCN    Type of Anesthesia:  Local    Additional requests/questions:    [name]   12/14/2021, 10:33 AM

## 2021-12-14 NOTE — Telephone Encounter (Signed)
   Primary Cardiologist: Donato Schultz, MD  Chart reviewed as part of pre-operative protocol coverage. Simple dental extractions are considered low risk procedures per guidelines and generally do not require any specific cardiac clearance. It is also generally accepted that for simple extractions and dental cleanings, there is no need to interrupt blood thinner therapy.   SBE prophylaxis is not required for the patient.  I will route this recommendation to the requesting party via Epic fax function and remove from pre-op pool.  Please call with questions.  Levi Aland, NP-C    12/14/2021, 3:27 PM [facility] [address] Office [phone] Fax [phone]

## 2021-12-16 ENCOUNTER — Telehealth: Payer: Self-pay

## 2021-12-16 NOTE — Telephone Encounter (Signed)
   Pre-operative Risk Assessment    Patient Name: Travis Chavez  DOB: 28-Apr-1961 MRN: [id number]     Request for Surgical Clearance    Procedure:  Dental Extraction - Amount of Teeth to be Pulled:  1  Date of Surgery:  Clearance TBD                                 Surgeon:  Vito Backers DDS Surgeon's Group or Practice Name:  [facility]  Phone number:  [phone] Fax number:  [phone]   Type of Clearance Requested:   - Medical    Type of Anesthesia:  Not Indicated   Additional requests/questions:    SignedChana Bode   12/16/2021, 3:53 PM

## 2021-12-16 NOTE — Telephone Encounter (Signed)
   Primary Cardiologist: Donato Schultz, MD  Chart reviewed as part of pre-operative protocol coverage. Simple dental extractions are considered low risk procedures per guidelines and generally do not require any specific cardiac clearance. It is also generally accepted that for simple extractions and dental cleanings, there is no need to interrupt blood thinner therapy.   SBE prophylaxis is not required for the patient.  I will route this recommendation to the requesting party via Epic fax function and remove from pre-op pool.  Please call with questions.  Levi Aland, NP-C    12/16/2021, 4:58 PM  [facility] [address] Office [phone] Fax [phone]

## 2022-03-26 ENCOUNTER — Encounter: Payer: Self-pay | Admitting: Cardiology

## 2022-03-26 ENCOUNTER — Other Ambulatory Visit: Payer: Self-pay | Admitting: *Deleted

## 2022-03-26 ENCOUNTER — Ambulatory Visit: Payer: Medicaid Other | Attending: Cardiology | Admitting: Cardiology

## 2022-03-26 VITALS — BP 126/80 | HR 78 | Ht 65.0 in | Wt 222.4 lb

## 2022-03-26 DIAGNOSIS — Z79899 Other long term (current) drug therapy: Secondary | ICD-10-CM

## 2022-03-26 DIAGNOSIS — I10 Essential (primary) hypertension: Secondary | ICD-10-CM

## 2022-03-26 DIAGNOSIS — E785 Hyperlipidemia, unspecified: Secondary | ICD-10-CM

## 2022-03-26 DIAGNOSIS — Z8673 Personal history of transient ischemic attack (TIA), and cerebral infarction without residual deficits: Secondary | ICD-10-CM | POA: Diagnosis not present

## 2022-03-26 MED ORDER — SPIRONOLACTONE 25 MG PO TABS: 12.5000 mg | ORAL_TABLET | Freq: Every day | ORAL | 3 refills | Status: DC

## 2022-03-26 MED ORDER — ATORVASTATIN CALCIUM 80 MG PO TABS: 80.0000 mg | ORAL_TABLET | Freq: Every day | ORAL | 3 refills | Status: AC

## 2022-03-26 NOTE — Progress Notes (Signed)
Cardiology Office Note:    Date:  03/26/2022   ID:  Travis Chavez, DOB 02/14/1961, MRN [id number]  PCP:  Travis Shepherd, FNP  [facility] Cardiologist:  Travis Furbish, MD  [facility] Electrophysiologist:  None   Referring MD: Travis Shepherd, FNP    History of Present Illness:    Travis Chavez is a 61 y.o. male here for the follow-up of prior stroke, junctional beats, second-degree heart block type I.  He last saw Travis Montana, NP, on 02/24/2021. He noted dyspnea on exertion with more than usual activity. EKG showed first degree AV block which was stable. Most recent LDL was at goal of less than 70. Blood pressure was well-controlled. Medications were continued as normal.  Echo normal EF.  There was previously concern about complete heart block but after reviewing the cardiac monitoring strips there are prolonging PR intervals with dropped QRS compatible with second-degree heart block type I.  Since his last visit was admitted again with stroke. Lost vision in his left eye left hand was numb felt nauseous. After about 3 hours or so he started to slowly regain some vision  Back in August 2021 had similar symptoms  Today, he is accompanied to his visit. He appears to be well.  He is still dealing with decreased vision. He states that he cannot see out of his peripherals.   His most recent LDL was 38.   He applied for disability but was denied, as they said he had not worked enough in the past ten years.  He denies any palpitations, chest pain, shortness of breath, or peripheral edema. No lightheadedness, headaches, syncope, orthopnea, or PND.   Past Medical History:  Diagnosis Date   Hypertension    Stroke [facility])     No past surgical history on file.  Current Medications: Current Meds  Medication Sig   amLODipine (NORVASC) 5 MG tablet Take 5 mg by mouth 2 (two) times daily.   atorvastatin (LIPITOR) 80 MG tablet Take 1 tablet (80 mg total) by mouth daily.    BRILINTA 90 MG TABS tablet Take 1 tablet (90 mg total) by mouth 2 (two) times daily.   hydrALAZINE (APRESOLINE) 50 MG tablet Take 1 tablet (50 mg total) by mouth every 8 (eight) hours.   hydrochlorothiazide (HYDRODIURIL) 25 MG tablet Take 1 tablet (25 mg total) by mouth daily.   spironolactone (ALDACTONE) 25 MG tablet Take 0.5 tablets (12.5 mg total) by mouth daily.   terazosin (HYTRIN) 2 MG capsule Take 2 mg by mouth 2 (two) times daily.   [DISCONTINUED] furosemide (LASIX) 20 MG tablet Take 1 tablet (20 mg total) by mouth daily.   [DISCONTINUED] potassium chloride SA (KLOR-CON) 20 MEQ tablet Take 1 tablet (20 mEq total) by mouth daily.     Allergies:   Beta adrenergic blockers   Social History   Socioeconomic History   Marital status: Married    Spouse name: Not on file   Number of children: Not on file   Years of education: Not on file   Highest education level: Not on file  Occupational History   Not on file  Tobacco Use   Smoking status: Never   Smokeless tobacco: Never  Substance and Sexual Activity   Alcohol use: No    Alcohol/week: 0.0 standard drinks of alcohol   Drug use: No   Sexual activity: Not on file  Other Topics Concern   Not on file  Social History Narrative   Not on  file   Social Determinants of Health   Financial Resource Strain: Not on file  Food Insecurity: Not on file  Transportation Needs: Not on file  Physical Activity: Not on file  Stress: Not on file  Social Connections: Not on file     Family History:  ROS:   Please see the history of present illness.    All other systems reviewed and are negative.  EKGs/Labs/Other Studies Reviewed:    The following studies were reviewed today:  ECHO 04/27/2020:  1. Left ventricular ejection fraction, by estimation, is 60 to 65%. The  left ventricle has normal function. There is mild left ventricular  hypertrophy. Left ventricular diastolic parameters are consistent with  Grade I diastolic  dysfunction (impaired  relaxation).   2. The mitral valve is normal in structure. Trivial mitral valve  regurgitation.   3. The aortic valve is normal in structure.   4. The inferior vena cava is normal in size with greater than 50%  respiratory variability, suggesting right atrial pressure of 3 mmHg.   Conclusion(s)/Recommendation(s): No intracardiac source of embolism  detected on this transthoracic study. A transesophageal echocardiogram is  recommended to exclude cardiac source of embolism if clinically indicated.   EKG: EKG is personally reviewed. 03/26/22: Sinus rhythm. Rate 78 bpm. Left axis deviation. Non-specific T wave changes. 05/26/20: 1st degree AVB    Recent Labs: 09/24/2021: Hemoglobin 13.9; Platelets 364; TSH 0.986  Recent Lipid Panel    Component Value Date/Time   CHOL 96 (L) 09/11/2020 1125   TRIG 84 09/11/2020 1125   HDL 41 09/11/2020 1125   CHOLHDL 2.3 09/11/2020 1125   CHOLHDL 2.6 04/27/2020 0746   VLDL 14 04/27/2020 0746   LDLCALC 38 09/11/2020 1125     Risk Assessment/Calculations:       Physical Exam:    VS:  BP 126/80   Pulse 78   Ht 5\' 5"  (1.651 m)   Wt 222 lb 6.4 oz (100.9 kg)   SpO2 97%   BMI 37.01 kg/m     Wt Readings from Last 3 Encounters:  03/26/22 222 lb 6.4 oz (100.9 kg)  02/24/21 214 lb 14.4 oz (97.5 kg)  09/11/20 227 lb (103 kg)     GEN:  Well nourished, well developed in no acute distress HEENT: Bilateral decreased vision NECK: No JVD; No carotid bruits LYMPHATICS: No lymphadenopathy CARDIAC: RRR, no murmurs, rubs, gallops RESPIRATORY:  Clear to auscultation without rales, wheezing or rhonchi  ABDOMEN: Soft, non-tender, non-distended MUSCULOSKELETAL:  No edema; No deformity  SKIN: Warm and dry NEUROLOGIC:  Alert and oriented x 3 PSYCHIATRIC:  Normal affect   ASSESSMENT:    1. Essential hypertension   2. Medication management   3. 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    Hyperlipidemia -Continue with high intensity atorvastatin 80 mg. LDL 38. Excellent. Goal is less than 70. No myalgias. Continue with current medical management.  Essential hypertension - Excellent control.  He had some difficulty obtaining potassium supplementation due to cost recently by an insurance change.  I think it makes sense for [place] to stop his Lasix 20 mg, potassium 20 mEq.  Lets start  spironolactone 12.5 mg once a day.  Potassium sparing diuretic.  Check basic metabolic profile in 2 weeks.    Follow up: 1 year.  Medication Adjustments/Labs and Tests Ordered: Current medicines are reviewed at length with the patient today.  Concerns regarding medicines are outlined above.  Orders Placed This Encounter  Procedures   Basic metabolic panel   EKG [id number]   Meds ordered this encounter  Medications   spironolactone (ALDACTONE) 25 MG tablet    Sig: Take 0.5 tablets (12.5 mg total) by mouth daily.    Dispense:  45 tablet    Refill:  3    Patient Instructions  Medication Instructions:  Please discontinue your Furosemide and potassium chloride. Start Spironolactone 12.5 mg daily. Continue all other medications as listed.  *If you need a refill on your cardiac medications before your next appointment, please call your pharmacy*  Lab Work: Please have blood work in 2 weeks (BMP)  If you have labs (blood work) drawn today and your tests are completely normal, you will receive your results only by: [place] (if you have MyChart) OR A paper copy in the mail If you have any lab test that is abnormal or we need to change your  treatment, we will call you to review the results.  Follow-Up: At [facility], you and your health needs are our priority.  As part of our continuing mission to provide you with exceptional heart care, we have created designated Provider Care Teams.  These Care Teams include your primary Cardiologist (physician) and Advanced Practice Providers (APPs -  Physician Assistants and Nurse Practitioners) who all work together to provide you with the care you need, when you need it.  We recommend signing up for the patient portal called "MyChart".  Sign up information is provided on this After Visit Summary.  MyChart is used to connect with patients for Virtual Visits (Telemedicine).  Patients are able to view lab/test results, encounter notes, upcoming appointments, etc.  Non-urgent messages can be sent to your provider as well.   To learn more about what you can do with MyChart, go to [email or web address].    Your next appointment:   1 year(s)  The format for your next appointment:   In Person  Provider:   Candee Furbish, MD      Important Information About Sugar          I,Breanna Adamick,acting as a scribe for Travis Furbish, MD.,have documented all relevant documentation on the behalf of Travis Furbish, MD,as directed by  Travis Furbish, MD while in the presence of Travis Furbish, MD.  I, Travis Furbish, MD, have reviewed all documentation for this visit. The documentation on 03/26/22 for the exam, diagnosis, procedures, and orders are all accurate and complete.   Signed, Travis Furbish, MD  03/26/2022 10:38 AM    [facility]

## 2022-03-26 NOTE — Addendum Note (Signed)
Addended by: Shellia Cleverly on: 03/26/2022 11:03 AM   Modules accepted: Orders

## 2022-03-26 NOTE — Patient Instructions (Signed)
Medication Instructions:  Please discontinue your Furosemide and potassium chloride. Start Spironolactone 12.5 mg daily. Continue all other medications as listed.  *If you need a refill on your cardiac medications before your next appointment, please call your pharmacy*  Lab Work: Please have blood work in 2 weeks (BMP)  If you have labs (blood work) drawn today and your tests are completely normal, you will receive your results only by: [place] (if you have MyChart) OR A paper copy in the mail If you have any lab test that is abnormal or we need to change your treatment, we will call you to review the results.  Follow-Up: At [facility], you and your health needs are our priority.  As part of our continuing mission to provide you with exceptional heart care, we have created designated Provider Care Teams.  These Care Teams include your primary Cardiologist (physician) and Advanced Practice Providers (APPs -  Physician Assistants and Nurse Practitioners) who all work together to provide you with the care you need, when you need it.  We recommend signing up for the patient portal called "MyChart".  Sign up information is provided on this After Visit Summary.  MyChart is used to connect with patients for Virtual Visits (Telemedicine).  Patients are able to view lab/test results, encounter notes, upcoming appointments, etc.  Non-urgent messages can be sent to your provider as well.   To learn more about what you can do with MyChart, go to [email or web address].    Your next appointment:   1 year(s)  The format for your next appointment:   In Person  Provider:   Candee Furbish, MD      Important Information About Sugar
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
## 2022-07-13 ENCOUNTER — Encounter: Payer: Self-pay | Admitting: Adult Health

## 2022-07-13 ENCOUNTER — Ambulatory Visit: Payer: Medicaid Other | Admitting: Adult Health

## 2023-01-13 ENCOUNTER — Emergency Department: Payer: BC Managed Care – PPO

## 2023-01-13 ENCOUNTER — Emergency Department
Admission: EM | Admit: 2023-01-13 | Discharge: 2023-01-13 | Disposition: A | Discharge status: Home / Self Care | Payer: BC Managed Care – PPO | Attending: Emergency Medicine | Admitting: Emergency Medicine

## 2023-01-13 DIAGNOSIS — S0990XA Unspecified injury of head, initial encounter: Secondary | ICD-10-CM

## 2023-01-13 DIAGNOSIS — Y9355 Activity, bike riding: Secondary | ICD-10-CM | POA: Insufficient documentation

## 2023-01-13 DIAGNOSIS — S060X0A Concussion without loss of consciousness, initial encounter: Secondary | ICD-10-CM | POA: Insufficient documentation

## 2023-01-13 LAB — COVID-19 (SARS-COV-2) & INFLUENZA  A/B, NAA (ROCHE LIAT)
Influenza A RNA: NOT DETECTED
Influenza B RNA: NOT DETECTED
SARS-CoV-2 (COVID-19) RNA: NOT DETECTED

## 2023-01-13 MED ORDER — METOCLOPRAMIDE HCL 5 MG/ML IJ SOLN
10.0000 mg | Freq: Once | INTRAMUSCULAR | Status: AC
Start: 2023-01-13 — End: 2023-01-13
  Administered 2023-01-13: 10 mg via INTRAVENOUS
  Filled 2023-01-13: qty 2

## 2023-01-13 MED ORDER — KETOROLAC TROMETHAMINE 30 MG/ML IJ SOLN
15.0000 mg | Freq: Once | INTRAMUSCULAR | Status: AC
Start: 2023-01-13 — End: 2023-01-13
  Administered 2023-01-13: 15 mg via INTRAVENOUS
  Filled 2023-01-13: qty 1

## 2023-01-13 MED ORDER — DIPHENHYDRAMINE HCL 50 MG/ML IJ SOLN
12.5000 mg | Freq: Once | INTRAMUSCULAR | Status: AC
Start: 2023-01-13 — End: 2023-01-13
  Administered 2023-01-13: 12.5 mg via INTRAVENOUS
  Filled 2023-01-13: qty 1

## 2023-01-13 MED ORDER — METOCLOPRAMIDE HCL 10 MG PO TABS
10.0000 mg | ORAL_TABLET | Freq: Four times a day (QID) | ORAL | 0 refills | Status: AC | PRN
Start: 2023-01-13 — End: ?

## 2023-01-13 MED ORDER — IBUPROFEN 600 MG PO TABS
600.0000 mg | ORAL_TABLET | Freq: Four times a day (QID) | ORAL | 0 refills | Status: AC | PRN
Start: 2023-01-13 — End: ?

## 2023-01-13 MED ORDER — SODIUM CHLORIDE 0.9 % IV BOLUS
1000.0000 mL | Freq: Once | INTRAVENOUS | Status: AC
Start: 2023-01-13 — End: 2023-01-13
  Administered 2023-01-13: 1000 mL via INTRAVENOUS
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
## 2023-01-13 NOTE — Discharge Instructions (Signed)
Dear Mr. Goatley:    Thank you for choosing the [facility] Emergency Department, the premier emergency department in the  area.  I hope your visit today was EXCELLENT. You will receive a survey via text message that will give you the opportunity to provide feedback to your team about your visit. Please do not hesitate to reach out with any questions!    Specific instructions for your visit today:    Post-Concussive Syndrome     You have been diagnosed with post-concussive syndrome.     Post-concussive syndrome can be caused by a significant head injury. People are often knocked out as a result of the head injury. After the head injury, the symptoms that continue to happen are called post-concussive syndrome.     Symptoms after a concussion can last from hours to months and even up to a year, depending on how bad the injury was. The symptoms can be worse or last longer if you have had a concussion in the past. These symptoms can happen soon after the concussion. They can also develop slowly over time.      Some of these symptoms can include:  Chronic headaches.  Light sensitivity.  Sleep problems.  Feeling off-balance when standing or moving.  Nausea (sick to your stomach) and/or vomiting (throwing up).  Feeling that your mind is slow or foggy.  Problems with memory and concentration or attention (easily distracted).  Forgetfulness.   Feeling irritable or depressed.     You can expect these symptoms after a concussion.     You may have had a CT scan or an MRI for your head injury. If it was normal, in most cases you will not need more scans. A person with a normal CT scan can have an injury happen later on. This is a rare event. This may be a blood clot in or around the brain. This is very uncommon in young patients. However, older patients or those who use blood thinners, like warfarin (Coumadin) or enoxaparin (Lovenox), may be at higher risk. Repeat scanning might be needed in these cases. These  scans happen if you are having new or unusual symptoms.     At this time, the cause of your symptoms does not seem dangerous. You don't need to stay in the hospital.     If you are an athlete, do not do any athletic activities until your doctor allows you to do so. Returning to sports too soon may increase your time of recovery. Another concussion so soon after the first may cause permanent problems or even death.     Some things you can try at home are:  Mental rest: Avoid watching TV, playing video games, using a computer, reading, etc.  Do not participate in athletic activities.  You can use NSAID medications like ibuprofen (Advil or Motrin), naproxen (Aleve, Naprosyn).  Avoid medicines that cause sleepiness or stimulation.     Follow up with your doctor as directed.     Follow the instructions for any medication you get prescribed.     We don't believe your condition is dangerous right now. However, you need to be careful. Sometimes a problem that seems small can get serious later. Therefore, it is very important for you to come back here or go to the nearest Emergency Department if you don't get better or your symptoms get worse.     YOU SHOULD SEEK MEDICAL ATTENTION IMMEDIATELY, EITHER HERE OR AT THE NEAREST EMERGENCY DEPARTMENT, IF  ANY OF THE FOLLOWING OCCUR:     You have a severe headache or a headache that gets worse and worse.  You have changes in your vision.  You fall often or pass out.  You have slurred speech or drooping of one side of your face.  You have weakness, numbness or tingling in either side of your body.  You become disoriented.  You have a seizure.  You are throwing up constantly and can't keep fluids down.   You have any other symptoms or concerns, or don't get better as expected.     If you can't follow up with your doctor, or if at any time you feel you need to be rechecked or seen again, come back here or go to the nearest emergency department.                 IF YOU DO NOT CONTINUE  TO IMPROVE OR YOUR CONDITION WORSENS, PLEASE CONTACT YOUR DOCTOR OR RETURN IMMEDIATELY TO THE EMERGENCY DEPARTMENT.    Sincerely,  Sherrie Sport, PA-C &  Salley Scarlet Hoy Finlay, MD  Attending Emergency Physician  [facility] Emergency Department    ONSITE PHARMACY  Our full service onsite pharmacy is located in the ER waiting room.  Open 7 days a week from 9 am to 9 pm.  We accept all major insurances and prices are competitive with major retailers.  Ask your provider to print your prescriptions down to the pharmacy to speed you on your way home.    OBTAINING A PRIMARY CARE APPOINTMENT    Primary care physicians (PCPs, also known as primary care doctors) are either internists or family medicine doctors. Both types of PCPs focus on health promotion, disease prevention, patient education and counseling, and treatment of acute and chronic medical conditions.    If you need a primary care doctor, please call the below number and ask who is receiving new patients.     [facility]  Telephone:  [phone]  [email or web address]    DOCTOR REFERRALS  Call [phone] (available 24 hours a day, 7 days a week) if you need any further referrals and we can help you find a primary care doctor or specialist.  Also, available online at:  [email or web address]    YOUR CONTACT INFORMATION  Before leaving please check with registration to make sure we have an up-to-date contact number.  You can call registration at [phone] to update your information.  For questions about your hospital bill, please call [phone].  For questions about your Emergency Dept Physician bill please call [phone].      FREE HEALTH SERVICES  If you need help with health or social services, please call 2-1-1 for a free referral to resources in your area.  2-1-1 is a free service connecting people with information on health insurance, free clinics, pregnancy, mental health, dental care, food assistance,  housing, and substance abuse counseling.  Also, available online at:  http://www.[facility].org    MEDICAL RECORDS AND TESTS  Certain laboratory test results do not come back the same day, for example urine cultures.   We will contact you if other important findings are noted.  Radiology films are often reviewed again to ensure accuracy.  If there is any discrepancy, we will notify you.      Please call [phone] to pick up a complimentary CD of any radiology studies performed.  If you or your doctor would like to request a copy of your medical records, please call [phone].  ORTHOPEDIC INJURY   Please know that significant injuries can exist even when an initial x-ray is read as normal or negative.  This can occur because some fractures (broken bones) are not initially visible on x-rays.  For this reason, close outpatient follow-up with your primary care doctor or bone specialist (orthopedist) is required.    MEDICATIONS AND FOLLOWUP  Please be aware that some prescription medications can cause drowsiness.  Use caution when driving or operating machinery.    The examination and treatment you have received in our Emergency Department is provided on an emergency basis, and is not intended to be a substitute for your primary care physician.  It is important that your doctor checks you again and that you report any new or remaining problems at that time.      24 HOUR PHARMACIES  The nearest 24 hour pharmacy is:    [facility]  [address]  [place], [place] [address]  [phone]      ASSISTANCE WITH INSURANCE    Affordable Care Act  [facility])  Call to start or finish an application, compare plans, enroll or ask a question.  [phone]  TTY: 5122831987  Web:  Healthcare.gov    Help Enrolling in [facility]  [employer] [place]  303-397-9198 (TOLL-FREE)  [phone] [phone] (TTY)  Web:  Http://www.coverva.org    Local Help Enrolling in the [facility]  [place] [facility]  [phone]  (MAIN)  Email:  health-help@nvfs .org  Web:  BlackjackMyths.is  Address:  [address] [place], [place] [address]    SEDATING MEDICATIONS  Sedating medications include strong pain medications (e.g. narcotics), muscle relaxers, benzodiazepines (used for anxiety and as muscle relaxers), Benadryl/diphenhydramine and other antihistamines for allergic reactions/itching, and other medications.  If you are unsure if you have received a sedating medication, please ask your physician or nurse.  If you received a sedating medication: DO NOT drive a car. DO NOT operate machinery. DO NOT perform jobs where you need to be alert.  DO NOT drink alcoholic beverages while taking this medicine.     If you get dizzy, sit or lie down at the first signs. Be careful going up and down stairs.  Be extra careful to prevent falls.     Never give this medicine to others.     Keep this medicine out of reach of children.     Do not take or save old medicines. Throw them away when outdated.     Keep all medicines in a cool, dry place. DO NOT keep them in your bathroom medicine cabinet or in a cabinet above the stove.    MEDICATION REFILLS  Please be aware that we cannot refill any prescriptions through the ER. If you need further treatment from what is provided at your ER visit, please follow up with your primary care doctor or your pain management specialist.    FREESTANDING EMERGENCY DEPARTMENTS OF [facility]  Did you know [facility] has two freestanding ERs located just a few miles away?   [facility] [place] have short wait times, easy free parking directly in front of the building and top patient satisfaction scores - and the same Board Certified Emergency Medicine doctors as [facility].

## 2023-10-07 ENCOUNTER — Ambulatory Visit: Attending: Cardiology | Admitting: Cardiology

## 2023-10-07 VITALS — BP 138/90 | HR 69 | Ht 65.0 in | Wt 227.0 lb

## 2023-10-07 DIAGNOSIS — Z79899 Other long term (current) drug therapy: Secondary | ICD-10-CM | POA: Diagnosis present

## 2023-10-07 DIAGNOSIS — I10 Essential (primary) hypertension: Secondary | ICD-10-CM | POA: Insufficient documentation

## 2023-10-07 DIAGNOSIS — E785 Hyperlipidemia, unspecified: Secondary | ICD-10-CM | POA: Diagnosis present

## 2023-10-07 DIAGNOSIS — Z8673 Personal history of transient ischemic attack (TIA), and cerebral infarction without residual deficits: Secondary | ICD-10-CM | POA: Diagnosis present

## 2023-10-07 NOTE — Progress Notes (Signed)
 Cardiology Office Note:  .   Date:  10/07/2023  ID:  Travis Chavez, DOB 02-Mar-1961, MRN [id number] PCP: Chales [place], FNP  [facility] Providers Cardiologist:  Dorothye Gathers, MD     History of Present Illness: .   Travis Chavez is a 63 y.o. male Discussed the use of AI scribe software for clinical note transcription with the patient, who gave verbal consent to proceed.  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  He experiences dyspnea on exertion, which has prompted this visit. 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 He discontinued spironolactone  due to shortness of breath. His cardiac history includes prolonged PR intervals with dropped QRS complexes.  He has a history of an occipital stroke with an acute right PCA territory infarct and intracranial atherosclerosis with right PCA occlusion. He experienced loss of vision in the left eye and numbness in the left hand, along with nausea. Some vision was regained after about three hours, but he continues to experience 'tunnel vision'.  He is on Brilinta  90 mg twice a day due to vision changes and peripheral vision loss. He also takes atorvastatin  80 mg daily, with a prior LDL of 38. His blood pressure is managed with amlodipine  5 mg twice a day, furosemide  20 mg daily, hydralazine  50 mg every eight hours, hydrochlorothiazide  25 mg daily, and potassium 20 mEq daily. He reports not taking spironolactone  12.5 mg daily.  He has gained some weight since his last primary care visit and is working with a dietitian to manage his diet, particularly reducing sugar and carbohydrate intake. He remains active, spending time on his feet and working in the yard, but notes some swelling in his legs, particularly on the right side, which he associates with his previous  stroke.      Studies Reviewed: Aaron Aas   EKG Interpretation Date/Time:  Friday Oct 07 2023 08:14:26 EDT Ventricular Rate:  69 PR Interval:  224 QRS Duration:  102 QT Interval:  416 QTC Calculation: 445 R Axis:   -35  Text Interpretation: Sinus rhythm with 1st degree A-V block Left axis deviation Pulmonary disease pattern When compared with ECG of 02-Feb-2020 18:24, No significant change since last tracing Confirmed by Dorothye Gathers ([id number]) on 10/07/2023 8:37:58 AM    Results LABS LDL: 38 (09/2023)  DIAGNOSTIC Echocardiogram: EF 60-65%, grade 1 diastolic dysfunction (2021) EKG: Normal (10/07/2023) Risk Assessment/Calculations:           Physical Exam:   VS:  BP (!) 138/90 (BP Location: Left Arm, Patient Position: Sitting, Cuff Size: Large)   Pulse 69   Ht 5\' 5"  (1.651 m)   Wt 227 lb (103 kg)   SpO2 96%   BMI 37.77 kg/m    Wt Readings from Last 3 Encounters:  10/07/23 227 lb (103 kg)  03/26/22 222 lb 6.4 oz (100.9 kg)  02/24/21 214 lb 14.4 oz (97.5 kg)    GEN: Well nourished, well developed in no acute distress NECK: No JVD; No carotid bruits CARDIAC: RRR, no murmurs, no rubs, no gallops RESPIRATORY:  Clear to auscultation without rales, wheezing or rhonchi  ABDOMEN: Soft, non-tender, non-distended EXTREMITIES:  Mild R>LLE  edema; No deformity   ASSESSMENT AND PLAN: .    Assessment and Plan Assessment & Plan Second-degree heart block type I Second-degree heart block type  I with junctional rhythm confirmed by cardiac monitoring strips, showing prolonged PR intervals and dropped QRS. He does not require regular cardiology follow-up unless new issues arise.  Essential hypertension Hypertension with slightly elevated blood pressure today. He had not taken his antihypertensive medication or eaten breakfast. Current medications include amlodipine , furosemide , hydralazine , hydrochlorothiazide , and spironolactone , although he reported discontinuing spironolactone  due to  dyspnea.  Dyspnea on exertion Dyspnea on exertion, possibly related to spironolactone , which he discontinued due to shortness of breath.  Peripheral edema Peripheral edema, possibly related to amlodipine , with more pronounced swelling on the right side, potentially due to previous stroke. Activity may contribute to fluid retention. - Consider compression stockings to manage edema. - Advise leg elevation when possible to reduce swelling. - Permit an extra furosemide  pill if swelling worsens.  Occipital stroke with right PCA occlusion Occipital stroke with acute right PCA territory ischemic infarct and intracranial atherosclerosis with right PCA occlusion. He experiences tunnel vision as a residual effect. Neurology recommended Brilinta  90 mg twice daily to prevent further strokes. No bleeding issues reported with Brilinta .  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  Hyperlipidemia Hyperlipidemia well-controlled with atorvastatin  80 mg daily. Last LDL was 38, which is excellent for post-stroke prevention.  We will have him graduate from the cardiology clinic.  Please let us  know if we can be of further assistance.      Signed, Dorothye Gathers, MD

## 2023-10-07 NOTE — Patient Instructions (Signed)
Medication Instructions:   Your physician recommends that you continue on your current medications as directed. Please refer to the Current Medication list given to you today.  *If you need a refill on your cardiac medications before your next appointment, please call your pharmacy*   Follow-Up:  AS NEEDED WITH DR. SKAINS   

## 2023-11-22 ENCOUNTER — Telehealth: Payer: Self-pay | Admitting: Cardiology

## 2023-11-22 NOTE — Telephone Encounter (Signed)
 NP, Loetta Ringer returning RN's call. Please advise.

## 2023-11-22 NOTE — Telephone Encounter (Signed)
 Calling to speak with the nurse/dr about the patient. Please advise

## 2023-11-22 NOTE — Telephone Encounter (Signed)
 Spoke with Loetta Ringer and she will fax over clearance form. Fax number given

## 2023-11-22 NOTE — Telephone Encounter (Signed)
 Left message for Penny Boxer NP to call office back.

## 2023-11-23 ENCOUNTER — Telehealth: Payer: Self-pay | Admitting: *Deleted

## 2023-11-23 NOTE — Telephone Encounter (Signed)
   Name: Travis Chavez  DOB: 12-Apr-1961  MRN: [id number]   Primary Cardiologist: Dorothye Gathers, MD  Chart reviewed as part of pre-operative protocol coverage.  Pt was recently seen in clinic with Dr. Renna Cary and graduated from cardiology clinic. I will fax his last clinic note. He is on brilinta  for hx of stroke. Please reach out to neurology for brilinta  hold.  I will route this recommendation to the requesting party via Epic fax function and remove from pre-op pool. Please call with questions.  Warren Haber [name], PA 11/23/2023, 1:31 PM

## 2023-11-23 NOTE — Telephone Encounter (Signed)
   Pre-operative Risk Assessment    Patient Name: Travis Chavez  DOB: Sep 13, 1960 MRN: [id number]   Date of last office visit: 10/07/23 DR. SKAINS Date of next office visit: NONE   Request for Surgical Clearance    Procedure:  LEFT DORSAL WRIST CYST EXCISION   Date of Surgery:  Clearance 12/06/23                                Surgeon:  DR. Norris Bee Surgeon's Group or Practice Name:  [facility] Phone number:  [phone] Fax number:  [phone]   Type of Clearance Requested:   - Medical  - Pharmacy:  Hold [facility]  (Brilinta )     Type of Anesthesia:  Not Indicated; LEFT MESSAGE TO CONFIRM IF ANESTHESIA TO BE USED    Additional requests/questions:    Princeton Broom   11/23/2023, 11:30 AM

## 2023-11-24 NOTE — Telephone Encounter (Signed)
 Denise from surgical center requesting a c/b. Please advise

## 2023-11-24 NOTE — Telephone Encounter (Signed)
 Travis Chavez called back from requesting office stating patient has no been seen neurology for a while and that a provider name Tamsen Fallow, NP is the one prescribing Brilinta  I made Travis Chavez aware we have no provider under that name she voiced understanding

## 2023-11-24 NOTE — Telephone Encounter (Signed)
 Left message for [name] I was returning her call. On my message left I did read the notes per preop APP written today:     Name: Travis Chavez  DOB: 06-03-1961  MRN: [id number]    Primary Cardiologist: Dorothye Gathers, MD   Chart reviewed as part of pre-operative protocol coverage.   Pt was recently seen in clinic with Dr. Renna Cary and graduated from cardiology clinic. I will fax his last clinic note. He is on brilinta  for hx of stroke. Please reach out to neurology for brilinta  hold.   I will route this recommendation to the requesting party via Epic fax function and remove from pre-op pool. Please call with questions.   Warren Haber Duke, PA 11/23/2023, 1:31 PM    Per these notes here the surgeon office will need to reach out to the neurologist for the pt. Per Dr. Renna Cary the pt is not needing to be seen by cardiology.   Looks like neurologist is Dr. Ardella Beaver with [facility]

## 2024-05-29 ENCOUNTER — Encounter: Payer: Self-pay | Admitting: Internal Medicine

## 2024-06-03 ENCOUNTER — Emergency Department

## 2024-06-03 ENCOUNTER — Emergency Department
Admission: EM | Admit: 2024-06-03 | Discharge: 2024-06-03 | Disposition: A | Discharge status: Home / Self Care | Attending: Emergency Medicine | Admitting: Emergency Medicine

## 2024-06-03 DIAGNOSIS — Z8673 Personal history of transient ischemic attack (TIA), and cerebral infarction without residual deficits: Secondary | ICD-10-CM | POA: Insufficient documentation

## 2024-06-03 DIAGNOSIS — M25561 Pain in right knee: Secondary | ICD-10-CM | POA: Diagnosis present

## 2024-06-03 DIAGNOSIS — M660 Rupture of popliteal cyst: Secondary | ICD-10-CM | POA: Diagnosis not present

## 2024-06-03 DIAGNOSIS — M79604 Pain in right leg: Secondary | ICD-10-CM

## 2024-06-03 MED ORDER — IBUPROFEN 600 MG PO TABS: 600.0000 mg | ORAL_TABLET | Freq: Three times a day (TID) | ORAL | 0 refills | Status: AC | PRN

## 2024-06-03 NOTE — ED Provider Notes (Signed)
 "  [facility] Provider Note   Event Date/Time   First MD Initiated Contact with Patient 06/03/24 1406     (approximate) History  Leg Pain HPI Travis Chavez is a 63 y.o. 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  Patient states that this pain is worse in the back of the right knee and can be worsened with walking up stairs.  Patient states that when he tries to extend this leg he has worsening pain.  Patient denies any trauma or fall. ROS: Patient currently denies any vision changes, tinnitus, difficulty speaking, facial droop, sore throat, chest pain, shortness of breath, abdominal pain, nausea/vomiting/diarrhea, dysuria, or weakness/numbness/paresthesias in any extremity   Physical Exam  Triage Vital Signs: ED Triage Vitals  Encounter Vitals Group     BP 06/03/24 1323 (!) 143/97     Girls Systolic BP Percentile --      Girls Diastolic BP Percentile --      Boys Systolic BP Percentile --      Boys Diastolic BP Percentile --      Pulse Rate 06/03/24 1323 81     Resp 06/03/24 1323 18     Temp 06/03/24 1323 98.3 F (36.8 C)     Temp src --      SpO2 06/03/24 1323 96 %     Weight 06/03/24 1322 228 lb (103.4 kg)     Height 06/03/24 1322 5' 5 (1.651 m)     Head Circumference --      Peak Flow --      Pain Score 06/03/24 1322 5     Pain Loc --      Pain Education --      Exclude from Growth Chart --    Most recent vital signs: Vitals:   06/03/24 1323  BP: (!) 143/97  Pulse: 81  Resp: 18  Temp: 98.3 F (36.8 C)  SpO2: 96%   General: Awake, oriented x4. CV:  Good peripheral perfusion. Resp:  Normal effort. Abd:  No distention. Other:  Middle-aged obese African-American male resting comfortably in no acute distress.  Tenderness to palpation over the posterior aspect of the right knee ED Results / Procedures / Treatments   Labs (all labs ordered are listed, but only abnormal results are displayed) Labs Reviewed - No data to display RADIOLOGY ED MD interpretation: Doppler ultrasound of the right lower extremity shows complex right Baker's cyst with evidence of possible rupture - All radiology independently interpreted and agree with radiology assessment Official radiology report(s): US  Venous Img Lower Unilateral Right (DVT) Result Date: 06/03/2024 CLINICAL DATA:  63 year old male with right lower extremity pain. EXAM: RIGHT LOWER EXTREMITY VENOUS DOPPLER ULTRASOUND TECHNIQUE: Gray-scale sonography with graded compression, as well as color Doppler and duplex ultrasound were performed to evaluate the right lower extremity deep venous systems from the level of the common femoral vein and including the common femoral, femoral, profunda femoral, popliteal and calf veins including the posterior tibial, peroneal and gastrocnemius veins when visible. Spectral Doppler was utilized to evaluate flow at rest and with distal augmentation maneuvers in the common femoral, femoral and popliteal veins. The contralateral common femoral vein was also evaluated for comparison. COMPARISON:  None Available. FINDINGS: RIGHT LOWER EXTREMITY Common Femoral Vein: No evidence of thrombus. Normal compressibility, respiratory phasicity and response to augmentation. Central Greater Saphenous Vein: No evidence of thrombus. Normal  compressibility and flow on color Doppler imaging. Central Profunda Femoral Vein: No evidence of thrombus. Normal compressibility and flow on color Doppler imaging. Femoral Vein: No evidence of thrombus. Normal compressibility, respiratory phasicity and response to augmentation. Popliteal Vein: No evidence of thrombus. Normal compressibility, respiratory phasicity and response to augmentation. Calf Veins: No evidence of thrombus. Normal compressibility and flow on color Doppler imaging. Other Findings: Moderately complex,  avascular ovoid collection about the right popliteal fossa measuring up to approximately 4.7 cm with subjacent muscular edema in the calf. LEFT LOWER EXTREMITY Common Femoral Vein: No evidence of thrombus. Normal compressibility, respiratory phasicity and response to augmentation. IMPRESSION: 1. No evidence of right lower extremity deep venous thrombosis. 2. Complex right baker cyst with evidence of possible rupture. Ester Sides, MD [facility] Radiology Electronically Signed   By: Ester Sides M.D.   On: 06/03/2024 15:27   PROCEDURES: Critical Care performed: No Procedures MEDICATIONS ORDERED IN ED: Medications - No data to display IMPRESSION / MDM / ASSESSMENT AND PLAN / ED COURSE  I reviewed the triage vital signs and the nursing notes.                             The patient is on the cardiac monitor to evaluate for evidence of arrhythmia and/or significant heart rate changes. Patient's presentation is most consistent with acute presentation with potential threat to life or bodily function. Patient is a 63 year old male with the above-stated past medical history presents complaining of right knee pain.  Patient was sent from urgent care for rule out of DVT.  Doppler ultrasound of the right lower extremity shows ruptured Baker's cyst and no evidence of DVT.  Patient was placed in Ace wrap with instructions for rest, compression, and elevation as well as anticipatory guidance.  Patient agrees with plan for discharge at this time with outpatient PCP follow-up as needed.  Pain well-controlled.  Patient given strict return precautions and all questions answered prior to discharge  Dispo: Discharge home with PCP follow-up as needed   FINAL CLINICAL IMPRESSION(S) / ED DIAGNOSES   Final diagnoses:  Ruptured Bakers cyst  Acute pain of right knee   Rx / DC Orders   ED Discharge Orders          Ordered    ibuprofen  (ADVIL ) 600 MG tablet  Every 8  hours PRN        06/03/24 1606           Note:  This document was prepared using Dragon voice recognition software and may include unintentional dictation errors.   Ethylene Reznick K, MD 06/03/24 [phone]  "

## 2024-06-03 NOTE — ED Triage Notes (Signed)
 Pt comes with right leg pain for 4 days. Pt comes from [facility] with needing to rule out DVT. Pt is on thinner.

## 2024-06-13 ENCOUNTER — Encounter: Payer: Self-pay | Admitting: Internal Medicine

## 2024-06-13 ENCOUNTER — Ambulatory Visit
Admission: RE | Admit: 2024-06-13 | Discharge: 2024-06-13 | Disposition: A | Discharge status: Home / Self Care | Attending: Internal Medicine | Admitting: Internal Medicine

## 2024-06-13 ENCOUNTER — Ambulatory Visit: Admitting: General Practice

## 2024-06-13 ENCOUNTER — Encounter
Admission: RE | Disposition: A | Discharge status: Home / Self Care | Payer: Self-pay | Source: Home / Self Care | Attending: Internal Medicine

## 2024-06-13 DIAGNOSIS — Z9049 Acquired absence of other specified parts of digestive tract: Secondary | ICD-10-CM | POA: Insufficient documentation

## 2024-06-13 DIAGNOSIS — H548 Legal blindness, as defined in USA: Secondary | ICD-10-CM | POA: Insufficient documentation

## 2024-06-13 DIAGNOSIS — K5730 Diverticulosis of large intestine without perforation or abscess without bleeding: Secondary | ICD-10-CM | POA: Diagnosis not present

## 2024-06-13 DIAGNOSIS — I69398 Other sequelae of cerebral infarction: Secondary | ICD-10-CM | POA: Insufficient documentation

## 2024-06-13 DIAGNOSIS — D125 Benign neoplasm of sigmoid colon: Secondary | ICD-10-CM | POA: Insufficient documentation

## 2024-06-13 DIAGNOSIS — Z980 Intestinal bypass and anastomosis status: Secondary | ICD-10-CM | POA: Insufficient documentation

## 2024-06-13 DIAGNOSIS — D122 Benign neoplasm of ascending colon: Secondary | ICD-10-CM | POA: Insufficient documentation

## 2024-06-13 DIAGNOSIS — I10 Essential (primary) hypertension: Secondary | ICD-10-CM | POA: Diagnosis not present

## 2024-06-13 DIAGNOSIS — Z7902 Long term (current) use of antithrombotics/antiplatelets: Secondary | ICD-10-CM | POA: Diagnosis not present

## 2024-06-13 DIAGNOSIS — Z79899 Other long term (current) drug therapy: Secondary | ICD-10-CM | POA: Insufficient documentation

## 2024-06-13 DIAGNOSIS — Z1211 Encounter for screening for malignant neoplasm of colon: Secondary | ICD-10-CM | POA: Diagnosis present

## 2024-06-13 DIAGNOSIS — Z87828 Personal history of other (healed) physical injury and trauma: Secondary | ICD-10-CM | POA: Insufficient documentation

## 2024-06-13 DIAGNOSIS — G4730 Sleep apnea, unspecified: Secondary | ICD-10-CM | POA: Diagnosis not present

## 2024-06-13 DIAGNOSIS — Z800 Family history of malignant neoplasm of digestive organs: Secondary | ICD-10-CM | POA: Diagnosis not present

## 2024-06-13 HISTORY — PX: POLYPECTOMY: SHX149

## 2024-06-13 HISTORY — DX: Nontraumatic intracerebral hemorrhage, unspecified: I61.9

## 2024-06-13 HISTORY — DX: Hyperlipidemia, unspecified: E78.5

## 2024-06-13 HISTORY — DX: Supraventricular tachycardia, unspecified: I47.10

## 2024-06-13 HISTORY — DX: Unspecified firearm discharge, undetermined intent, initial encounter: Y24.9XXA

## 2024-06-13 HISTORY — DX: Anemia, unspecified: D64.9

## 2024-06-13 HISTORY — DX: Transient cerebral ischemic attack, unspecified: G45.9

## 2024-06-13 HISTORY — DX: Headache, unspecified: R51.9

## 2024-06-13 HISTORY — PX: COLONOSCOPY: SHX5424

## 2024-06-13 HISTORY — DX: Prediabetes: R73.03

## 2024-06-13 HISTORY — DX: Elevated white blood cell count, unspecified: D72.829

## 2024-06-13 HISTORY — DX: Ganglion, unspecified wrist: M67.439

## 2024-06-13 SURGERY — COLONOSCOPY
Anesthesia: General

## 2024-06-13 MED ORDER — SODIUM CHLORIDE 0.9 % IV SOLN: INTRAVENOUS | Status: DC

## 2024-06-13 MED ORDER — PROPOFOL 500 MG/50ML IV EMUL
INTRAVENOUS | Status: DC | PRN
  Administered 2024-06-13: 80 mg via INTRAVENOUS
  Administered 2024-06-13: 20 mg via INTRAVENOUS
  Administered 2024-06-13: 200 ug/kg/min via INTRAVENOUS
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
## 2024-06-13 NOTE — Interval H&P Note (Signed)
 History and Physical Interval Note:  06/13/2024 10:45 AM  Fairy KATHEE Louder  has presented today for surgery, with the diagnosis of Family history of colon cancer (Z80.0).  The various methods of treatment have been discussed with the patient and family. After consideration of risks, benefits and other options for treatment, the patient has consented to  Procedures: COLONOSCOPY (N/A) as a surgical intervention.  The patient's history has been reviewed, patient examined, no change in status, stable for surgery.  I have reviewed the patient's chart and labs.  Questions were answered to the patient's satisfaction.     Ambrose, Eliot Popper

## 2024-06-13 NOTE — Transfer of Care (Signed)
 Immediate Anesthesia Transfer of Care Note  Patient: Travis Chavez  Procedure(s) Performed: COLONOSCOPY POLYPECTOMY, INTESTINE  Patient Location: PACU and Endoscopy Unit  Anesthesia Type:General  Level of Consciousness: awake, alert , and oriented  Airway & Oxygen Therapy: Patient Spontanous Breathing  Post-op Assessment: Report given to RN and Post -op Vital signs reviewed and stable  Post vital signs: stable  Last Vitals:  Vitals Value Taken Time  BP 97/65 06/13/24 12:00  Temp 35.1 C 06/13/24 12:00  Pulse 74 06/13/24 12:00  Resp 18 06/13/24 12:00  SpO2 93 % 06/13/24 12:00    Last Pain:  Vitals:   06/13/24 1200  TempSrc: Temporal  PainSc: 0-No pain         Complications: There were no known notable events for this encounter.

## 2024-06-13 NOTE — Op Note (Signed)
 [facility] Gastroenterology Patient Name: Travis Chavez Procedure Date: 06/13/2024 11:21 AM MRN: [id number] Account #: [id number] Date of Birth: 25-Oct-1960 Admit Type: Outpatient Age: 64 Room: [facility] ENDO ROOM 2 Gender: Male Note Status: Finalized Instrument Name: Colon Scope [id number] Procedure:             Colonoscopy Indications:           Screening in patient at increased risk: Colorectal                         cancer in sister 70 or older Providers:             Elliott Lasecki K. Aundria MD, MD Referring MD:          Burnard CROME. Cobb (Referring MD) Medicines:             Propofol  per Anesthesia Complications:         No immediate complications. Estimated blood loss:                         Minimal. Procedure:             Pre-Anesthesia Assessment:                        - The risks and benefits of the procedure and the                         sedation options and risks were discussed with the                         patient. All questions were answered and informed                         consent was obtained.                        - Patient identification and proposed procedure were                         verified prior to the procedure by the nurse. The                         procedure was verified in the procedure room.                        - ASA Grade Assessment: III - A patient with severe                         systemic disease.                        - After reviewing the risks and benefits, the patient                         was deemed in satisfactory condition to undergo the                         procedure.                        After obtaining  informed consent, the colonoscope was                         passed under direct vision. Throughout the procedure,                         the patient's blood pressure, pulse, and oxygen                         saturations were monitored continuously. The                         Colonoscope was introduced  through the anus and                         advanced to the the ileocolonic anastomosis. The                         colonoscopy was performed without difficulty. The                         patient tolerated the procedure well. The quality of                         the bowel preparation was good. Ileocolonic                         anastomosis and neoterminal ileum were photographed. Findings:      The perianal and digital rectal examinations were normal. Pertinent       negatives include normal sphincter tone and no palpable rectal lesions.      Many medium-mouthed and small-mouthed diverticula were found in the       sigmoid colon. There was no evidence of diverticular bleeding.      There was evidence of a prior end-to-side ileo-colonic anastomosis in       the ascending colon. This was patent and was characterized by healthy       appearing mucosa. The anastomosis was traversed. Estimated blood loss:       none.      The neo-terminal ileum appeared normal.      A 20 mm polyp was found in the ascending colon. The polyp was       pedunculated. The polyp was removed with a piecemeal technique using a       hot snare. Resection and retrieval were complete. To prevent bleeding       after the polypectomy, one hemostatic clip was successfully placed (MR       conditional). Clip manufacturer: [employer]. There was no       bleeding during, or at the end, of the procedure. Estimated blood loss       was minimal.      A 16 mm polyp was found in the distal sigmoid colon. The polyp was       pedunculated. The polyp was removed with a hot snare. Resection and       retrieval were complete. Estimated blood loss: none.      The exam was otherwise without abnormality on direct and retroflexion       views. Impression:            - Mild diverticulosis in the  sigmoid colon. There was                         no evidence of diverticular bleeding.                        - Patent end-to-side  ileo-colonic anastomosis,                         characterized by healthy appearing mucosa.                        - The examined portion of the ileum was normal.                        - One 20 mm polyp in the ascending colon, removed                         piecemeal using a hot snare. Resected and retrieved.                         Clip (MR conditional) was placed. Clip manufacturer:                         [employer].                        - One 16 mm polyp in the distal sigmoid colon, removed                         with a hot snare. Resected and retrieved.                        - The examination was otherwise normal on direct and                         retroflexion views. Recommendation:        - Patient has a contact number available for                         emergencies. The signs and symptoms of potential                         delayed complications were discussed with the patient.                         Return to normal activities tomorrow. Written                         discharge instructions were provided to the patient.                        - Resume previous diet.                        - Continue present medications.                        - Repeat colonoscopy is recommended for surveillance.  The colonoscopy date will be determined after                         pathology results from today's exam become available                         for review.                        - Return to GI office PRN.                        - Resume Brilinta  (ticagrelor ) at prior dose tomorrow.                         Refer to managing physician for further adjustment of                         therapy.                        - The findings and recommendations were discussed with                         the patient and their spouse. Procedure Code(s):     --- Professional ---                        [phone], Colonoscopy, flexible; with removal of                          tumor(s), polyp(s), or other lesion(s) by snare                         technique Diagnosis Code(s):     --- Professional ---                        K57.30, Diverticulosis of large intestine without                         perforation or abscess without bleeding                        D12.5, Benign neoplasm of sigmoid colon                        D12.2, Benign neoplasm of ascending colon                        Z98.0, Intestinal bypass and anastomosis status                        Z80.0, Family history of malignant neoplasm of                         digestive organs CPT copyright 2022 American Medical Association. All rights reserved. The codes documented in this report are preliminary and upon coder review may  be revised to meet current compliance requirements. Ladell MARLA Boss MD, MD 06/13/2024 12:00:13 PM This report has been signed electronically. Number of Addenda: 0 Note Initiated On: 06/13/2024 11:21 AM  Scope Withdrawal Time: 0 hours 13 minutes 42 seconds  Total Procedure Duration: 0 hours 18 minutes 55 seconds  Estimated Blood Loss:  Estimated blood loss was minimal.      [facility]
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
## 2024-06-14 LAB — SURGICAL PATHOLOGY

## 2024-06-14 NOTE — Anesthesia Postprocedure Evaluation (Signed)
"   Anesthesia Post Note  Patient: Travis Chavez  Procedure(s) Performed: COLONOSCOPY POLYPECTOMY, INTESTINE  Patient location during evaluation: Endoscopy Anesthesia Type: General Level of consciousness: awake and alert Pain management: pain level controlled Vital Signs Assessment: post-procedure vital signs reviewed and stable Respiratory status: spontaneous breathing, nonlabored ventilation, respiratory function stable and patient connected to nasal cannula oxygen Cardiovascular status: blood pressure returned to baseline and stable Postop Assessment: no apparent nausea or vomiting Anesthetic complications: no   There were no known notable events for this encounter.   Last Vitals:  Vitals:   06/13/24 1225 06/13/24 1230  BP: 114/74 122/71  Pulse: 66 66  Resp: 17 16  Temp:    SpO2: 98% 98%    Last Pain:  Vitals:   06/13/24 1230  TempSrc:   PainSc: 0-No pain                 Debby Mines      "

## 2024-06-22 ENCOUNTER — Other Ambulatory Visit: Payer: Self-pay

## 2024-06-22 ENCOUNTER — Observation Stay
Admission: EM | Admit: 2024-06-22 | Discharge: 2024-06-25 | Disposition: A | Discharge status: Home / Self Care | Attending: Internal Medicine | Admitting: Internal Medicine

## 2024-06-22 DIAGNOSIS — G459 Transient cerebral ischemic attack, unspecified: Secondary | ICD-10-CM | POA: Diagnosis present

## 2024-06-22 DIAGNOSIS — K922 Gastrointestinal hemorrhage, unspecified: Secondary | ICD-10-CM | POA: Diagnosis present

## 2024-06-22 DIAGNOSIS — K633 Ulcer of intestine: Secondary | ICD-10-CM | POA: Insufficient documentation

## 2024-06-22 DIAGNOSIS — E785 Hyperlipidemia, unspecified: Secondary | ICD-10-CM | POA: Insufficient documentation

## 2024-06-22 DIAGNOSIS — I10 Essential (primary) hypertension: Secondary | ICD-10-CM | POA: Insufficient documentation

## 2024-06-22 DIAGNOSIS — K91840 Postprocedural hemorrhage and hematoma of a digestive system organ or structure following a digestive system procedure: Principal | ICD-10-CM | POA: Insufficient documentation

## 2024-06-22 DIAGNOSIS — Z79899 Other long term (current) drug therapy: Secondary | ICD-10-CM | POA: Insufficient documentation

## 2024-06-22 DIAGNOSIS — K921 Melena: Principal | ICD-10-CM

## 2024-06-22 DIAGNOSIS — N400 Enlarged prostate without lower urinary tract symptoms: Secondary | ICD-10-CM | POA: Insufficient documentation

## 2024-06-22 DIAGNOSIS — Z8673 Personal history of transient ischemic attack (TIA), and cerebral infarction without residual deficits: Secondary | ICD-10-CM | POA: Insufficient documentation

## 2024-06-22 DIAGNOSIS — M1612 Unilateral primary osteoarthritis, left hip: Secondary | ICD-10-CM | POA: Insufficient documentation

## 2024-06-22 LAB — GLUCOSE, CAPILLARY: Glucose-Capillary: 121 mg/dL — ABNORMAL HIGH (ref 70–99)

## 2024-06-22 LAB — CBC
HCT: 36 % — ABNORMAL LOW (ref 39.0–52.0)
Hemoglobin: 11.9 g/dL — ABNORMAL LOW (ref 13.0–17.0)
MCH: 29 pg (ref 26.0–34.0)
MCHC: 33.1 g/dL (ref 30.0–36.0)
MCV: 87.6 fL (ref 80.0–100.0)
Platelets: 296 K/uL (ref 150–400)
RBC: 4.11 MIL/uL — ABNORMAL LOW (ref 4.22–5.81)
RDW: 12.9 % (ref 11.5–15.5)
WBC: 8.6 K/uL (ref 4.0–10.5)
nRBC: 0 % (ref 0.0–0.2)

## 2024-06-22 LAB — TYPE AND SCREEN
ABO/RH(D): B POS
Antibody Screen: NEGATIVE

## 2024-06-22 LAB — HEMOGLOBIN AND HEMATOCRIT, BLOOD
HCT: 33.1 % — ABNORMAL LOW (ref 39.0–52.0)
Hemoglobin: 11.1 g/dL — ABNORMAL LOW (ref 13.0–17.0)

## 2024-06-22 LAB — COMPREHENSIVE METABOLIC PANEL WITH GFR
ALT: 22 U/L (ref 0–44)
AST: 23 U/L (ref 15–41)
Albumin: 4 g/dL (ref 3.5–5.0)
Alkaline Phosphatase: 69 U/L (ref 38–126)
Anion gap: 9 (ref 5–15)
BUN: 17 mg/dL (ref 8–23)
CO2: 26 mmol/L (ref 22–32)
Calcium: 9 mg/dL (ref 8.9–10.3)
Chloride: 106 mmol/L (ref 98–111)
Creatinine, Ser: 1.05 mg/dL (ref 0.61–1.24)
GFR, Estimated: >60 mL/min
Glucose, Bld: 117 mg/dL — ABNORMAL HIGH (ref 70–99)
Potassium: 3.5 mmol/L (ref 3.5–5.1)
Sodium: 141 mmol/L (ref 135–145)
Total Bilirubin: 0.5 mg/dL (ref 0.0–1.2)
Total Protein: 6.4 g/dL — ABNORMAL LOW (ref 6.5–8.1)

## 2024-06-22 LAB — MAGNESIUM: Magnesium: 2 mg/dL (ref 1.7–2.4)

## 2024-06-22 MED ORDER — ACETAMINOPHEN 325 MG PO TABS: 650.0000 mg | ORAL_TABLET | Freq: Four times a day (QID) | ORAL | Status: DC | PRN

## 2024-06-22 MED ORDER — ACETAMINOPHEN 650 MG RE SUPP: 650.0000 mg | Freq: Four times a day (QID) | RECTAL | Status: DC | PRN

## 2024-06-22 MED ORDER — SODIUM CHLORIDE 0.9% FLUSH
3.0000 mL | Freq: Two times a day (BID) | INTRAVENOUS | Status: DC
  Administered 2024-06-22 – 2024-06-25 (×7): 3 mL via INTRAVENOUS

## 2024-06-22 MED ORDER — LACTATED RINGERS IV SOLN: INTRAVENOUS | Status: DC

## 2024-06-22 MED ORDER — ONDANSETRON HCL 4 MG PO TABS: 4.0000 mg | ORAL_TABLET | Freq: Four times a day (QID) | ORAL | Status: DC | PRN

## 2024-06-22 MED ORDER — ONDANSETRON HCL 4 MG/2ML IJ SOLN: 4.0000 mg | Freq: Four times a day (QID) | INTRAMUSCULAR | Status: DC | PRN

## 2024-06-22 MED ORDER — SENNOSIDES-DOCUSATE SODIUM 8.6-50 MG PO TABS: 1.0000 | ORAL_TABLET | Freq: Every evening | ORAL | Status: DC | PRN

## 2024-06-22 NOTE — ED Notes (Signed)
 See triage note    Family states they noticed some bright red blood when having a BM this am Denies any pain Had a recent colonoscopy
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
## 2024-06-22 NOTE — ED Provider Notes (Signed)
 [facility] Provider Note    Event Date/Time   First MD Initiated Contact with Patient 06/22/24 1401     (approximate)   History   GI Bleeding   HPI  Travis Chavez is a 64 y.o. male who is legally blind, prior history of CVA on Brilinta , presenting with hematochezia.  Per independent history from wife, patient had 3 episodes of blood in his stool today.  Had normal stools yesterday.  He denies any abdominal pain, no nausea vomiting.  No fevers.  States that he had a colonoscopy done on the seventh, had a polyp removed.  They had called Dr. Sanjuan office but he is on vacation and they sent him here for further evaluation.  He did take his Brilinta  today.  On independent chart review, he was seen by gastroenterology on the seventh where he got his colonoscopy, was noted to have mild diverticulosis, had a 20 mm polyp that was removed.  Also a 16 mm polyp that was removed.     Physical Exam   Triage Vital Signs: ED Triage Vitals  Encounter Vitals Group     BP 06/22/24 1158 (!) 121/96     Girls Systolic BP Percentile --      Girls Diastolic BP Percentile --      Boys Systolic BP Percentile --      Boys Diastolic BP Percentile --      Pulse Rate 06/22/24 1158 96     Resp 06/22/24 1158 18     Temp 06/22/24 1158 97.6 F (36.4 C)     Temp Source 06/22/24 1158 Oral     SpO2 06/22/24 1158 97 %     Weight 06/22/24 1202 228 lb (103.4 kg)     Height 06/22/24 1202 5' 5 (1.651 m)     Head Circumference --      Peak Flow --      Pain Score 06/22/24 1202 0     Pain Loc --      Pain Education --      Exclude from Growth Chart --     Most recent vital signs: Vitals:   06/22/24 1158  BP: (!) 121/96  Pulse: 96  Resp: 18  Temp: 97.6 F (36.4 C)  SpO2: 97%     General: Awake, no distress. CV:  Good peripheral perfusion.  Resp:  Normal effort.  Abd:  No distention.  Soft nontender Other:  External rectal exam was done as chaperone, no bleeding  hemorrhoids noted   ED Results / Procedures / Treatments   Labs (all labs ordered are listed, but only abnormal results are displayed) Labs Reviewed  COMPREHENSIVE METABOLIC PANEL WITH GFR - Abnormal; Notable for the following components:      Result Value   Glucose, Bld 117 (*)    Total Protein 6.4 (*)    All other components within normal limits  CBC - Abnormal; Notable for the following components:   RBC 4.11 (*)    Hemoglobin 11.9 (*)    HCT 36.0 (*)    All other components within normal limits  POC OCCULT BLOOD, ED  TYPE AND SCREEN     PROCEDURES:  Critical Care performed: No  Procedures   MEDICATIONS ORDERED IN ED: Medications - No data to display   IMPRESSION / MDM / ASSESSMENT AND PLAN / ED COURSE  I reviewed the triage vital signs and the nursing notes.  Differential diagnosis includes, but is not limited to, anemia, GI bleed, colonoscopy complication.  Labs.  Will reach out GI.  Patient's presentation is most consistent with acute presentation with potential threat to life or bodily function.  Independent interpretation of labs below.  Consulted GI who recommended admission, they will monitor and see if he needs a colonoscopy on Monday.  No imaging warranted at this time.  Consulted hospitalist for admission.  He is admitted.    Clinical Course as of 06/22/24 1455  Fri Jun 22, 2024  1423 Independent review of labs, electrolytes not severely deranged, LFTs are normal, no leukocytosis, hemoglobin is 11.9, down from 13 several years ago. [TT]    Clinical Course User Index [TT] Waymond, Lorelle Cummins, MD     FINAL CLINICAL IMPRESSION(S) / ED DIAGNOSES   Final diagnoses:  Hematochezia     Rx / DC Orders   ED Discharge Orders     None        Note:  This document was prepared using Dragon voice recognition software and may include unintentional dictation errors.    Waymond Lorelle Cummins, MD 06/22/24 1455

## 2024-06-22 NOTE — ED Triage Notes (Addendum)
 Pt BIB [facility] coming from home. Presents with GI bleed. Pt had 3 BMs with coffee ground looking stool this AM. Yesterday pt was feeling a little off. EMS admin 1L IVF. Pt has had a polyp removed on 1/7 during a colonoscopy. Pt is on Brilinta . Pt denies lightheadedness, dizziness, CP, or ShOB.  .   Pt is legally blind he is able to see things right in front of him.  EMS Vitals  130/68 HR 92

## 2024-06-23 DIAGNOSIS — K922 Gastrointestinal hemorrhage, unspecified: Secondary | ICD-10-CM

## 2024-06-23 DIAGNOSIS — Z8673 Personal history of transient ischemic attack (TIA), and cerebral infarction without residual deficits: Secondary | ICD-10-CM

## 2024-06-23 DIAGNOSIS — I10 Essential (primary) hypertension: Secondary | ICD-10-CM

## 2024-06-23 DIAGNOSIS — E785 Hyperlipidemia, unspecified: Secondary | ICD-10-CM | POA: Diagnosis not present

## 2024-06-23 LAB — CBC
HCT: 31.3 % — ABNORMAL LOW (ref 39.0–52.0)
Hemoglobin: 10.5 g/dL — ABNORMAL LOW (ref 13.0–17.0)
MCH: 28.8 pg (ref 26.0–34.0)
MCHC: 33.5 g/dL (ref 30.0–36.0)
MCV: 85.8 fL (ref 80.0–100.0)
Platelets: 277 K/uL (ref 150–400)
RBC: 3.65 MIL/uL — ABNORMAL LOW (ref 4.22–5.81)
RDW: 13.1 % (ref 11.5–15.5)
WBC: 13.4 K/uL — ABNORMAL HIGH (ref 4.0–10.5)
nRBC: 0 % (ref 0.0–0.2)

## 2024-06-23 LAB — GLUCOSE, CAPILLARY: Glucose-Capillary: 94 mg/dL (ref 70–99)

## 2024-06-23 LAB — HEMOGLOBIN AND HEMATOCRIT, BLOOD
HCT: 30.6 % — ABNORMAL LOW (ref 39.0–52.0)
HCT: 29.5 % — ABNORMAL LOW (ref 39.0–52.0)
Hemoglobin: 10.3 g/dL — ABNORMAL LOW (ref 13.0–17.0)
Hemoglobin: 9.7 g/dL — ABNORMAL LOW (ref 13.0–17.0)

## 2024-06-23 LAB — BASIC METABOLIC PANEL WITH GFR
Anion gap: 7 (ref 5–15)
BUN: 17 mg/dL (ref 8–23)
CO2: 25 mmol/L (ref 22–32)
Calcium: 8.7 mg/dL — ABNORMAL LOW (ref 8.9–10.3)
Chloride: 108 mmol/L (ref 98–111)
Creatinine, Ser: 1.12 mg/dL (ref 0.61–1.24)
GFR, Estimated: >60 mL/min
Glucose, Bld: 113 mg/dL — ABNORMAL HIGH (ref 70–99)
Potassium: 3.6 mmol/L (ref 3.5–5.1)
Sodium: 141 mmol/L (ref 135–145)

## 2024-06-23 LAB — HIV ANTIBODY (ROUTINE TESTING W REFLEX): HIV Screen 4th Generation wRfx: NONREACTIVE

## 2024-06-23 MED ORDER — SODIUM CHLORIDE 0.9 % IV SOLN
INTRAVENOUS | Status: DC
  Administered 2024-06-23: 10 mL via INTRAVENOUS
  Administered 2024-06-25: 20 mL/h via INTRAVENOUS

## 2024-06-23 MED ORDER — PEG 3350-KCL-NA BICARB-NACL 420 G PO SOLR
2000.0000 mL | Freq: Once | ORAL | Status: AC
  Administered 2024-06-25: 2000 mL via ORAL
  Filled 2024-06-23: qty 4000

## 2024-06-23 MED ORDER — PEG 3350-KCL-NA BICARB-NACL 420 G PO SOLR
2000.0000 mL | Freq: Once | ORAL | Status: AC
  Administered 2024-06-24: 2000 mL via ORAL
  Filled 2024-06-23: qty 4000

## 2024-06-23 NOTE — Assessment & Plan Note (Signed)
 Not taking Lipitor

## 2024-06-23 NOTE — Assessment & Plan Note (Addendum)
 Patient had a recent colonoscopy on 06/13/2024 removing a large polyp of 20 mm that was clipped and a 16 mm polyp.  Patient takes Brilinta  at home and this is now on hold.  Last dose was yesterday morning.  Serial hemoglobins.  Could be a diverticular bleed or bleeding after polypectomy.  Colonoscopy for tomorrow.  If continues to hemorrhage can order a CT angio GI bleed versus bleeding scan.

## 2024-06-23 NOTE — Plan of Care (Signed)
  Problem: Education: Goal: Knowledge of General Education information will improve Description: Including pain rating scale, medication(s)/side effects and non-pharmacologic comfort measures Outcome: Progressing   Problem: Clinical Measurements: Goal: Ability to maintain clinical measurements within normal limits will improve Outcome: Progressing Goal: Will remain free from infection Outcome: Progressing   Problem: Coping: Goal: Level of anxiety will decrease Outcome: Progressing   Problem: Pain Managment: Goal: General experience of comfort will improve and/or be controlled Outcome: Progressing   Problem: Safety: Goal: Ability to remain free from injury will improve Outcome: Progressing   Problem: Skin Integrity: Goal: Risk for impaired skin integrity will decrease Outcome: Progressing

## 2024-06-23 NOTE — Assessment & Plan Note (Addendum)
 Blood pressure medications on hold with GI bleed.  Not orthostatic.  Stable for discharge home.  They have a blood pressure cuff at home and if blood pressure starts rising above 140/90 can restart Norvasc .

## 2024-06-23 NOTE — Hospital Course (Signed)
 64 y.o. year old male with medical history of hypertension, hyperlipidemia, prediabetes, history of TIA presenting to the ED after having multiple episodes of blood in bowel movements.  States he did not have these yesterday.  Since this morning he has had 3 bowel movement with blood.  He called his gastroenterologist and they recommended he go to the ED for further evaluation.  Describes blood as bright red.  Patient states he had a colonoscopy earlier this month and polyp was removed.  Colonoscopy report reviewed and it showed multiple polyps which were removed along with patient having diverticulosis.  Patient was instructed regarding Brilinta  1 day later. On arrival to the ED patient was noted to be HDS stable.  Lab work obtained.  CBC showed hemoglobin decreased from baseline at 11.9.  There is hemoglobin greater than 14 in Care Everywhere, last year.  CMP grossly unremarkable without any BUN elevation. EDP discussed with GI who recommended admission for monitoring and consideration for repeat colonoscopy. Given this, [facility] contacted for admission.   11/17.  Hemoglobin this morning 10.3.  Patient seen this morning and did not have a bowel movement since 10 PM last night.  In speaking with patient's wife, the blood started off burgundy but then was bright red blood. 11/18.  Patient feeling better.  As per wife still had a little blood.  Colonoscopy for tomorrow. 11/19.  Colonoscopy showing a ulcer in the cecal area.  Gastroenterology placed 3 clips.  Gastroenterology cleared to go home today and restart Brilinta  tomorrow.

## 2024-06-23 NOTE — Progress Notes (Signed)
 pt ambulated to bathroom and became weak and sweaty while sitting on the toilet. Pt did not lose consciousness, but was slow to respond. Pt was moved safely to the bed. VSS BP 115/80 and pulse in 60s. Pt denies SOB/CP. While in bed, pt responding to all questions appropriately. Pt did have bright red blood in stool, consistent with his previous BMs today. MD notified, stat CBC ordered.

## 2024-06-23 NOTE — Progress Notes (Signed)
 " Progress Note   Patient: Travis Chavez FMW:[id number] DOB: 1961-03-30 DOA: 06/22/2024     0 DOS: the patient was seen and examined on 06/23/2024   Brief hospital course: 64 y.o. year old male with medical history of hypertension, hyperlipidemia, prediabetes, history of TIA presenting to the ED after having multiple episodes of blood in bowel movements.  States he did not have these yesterday.  Since this morning he has had 3 bowel movement with blood.  He called his gastroenterologist and they recommended he go to the ED for further evaluation.  Describes blood as bright red.  Patient states he had a colonoscopy earlier this month and polyp was removed.  Colonoscopy report reviewed and it showed multiple polyps which were removed along with patient having diverticulosis.  Patient was instructed regarding Brilinta  1 day later. On arrival to the ED patient was noted to be HDS stable.  Lab work obtained.  CBC showed hemoglobin decreased from baseline at 11.9.  There is hemoglobin greater than 14 in Care Everywhere, last year.  CMP grossly unremarkable without any BUN elevation. EDP discussed with GI who recommended admission for monitoring and consideration for repeat colonoscopy. Given this, [facility] contacted for admission.   11/17.  Hemoglobin this morning 10.3.  Patient seen this morning and did not have a bowel movement since 10 PM last night.  In speaking with patient's wife, the blood started off burgundy but then was bright red blood.  Assessment and Plan: * Lower GI bleed Patient had a recent colonoscopy on 06/13/2024 removing a large polyp of 20 mm that was clipped and a 16 mm polyp.  Patient takes Brilinta  at home and this is now on hold.  Last dose was yesterday morning.  Serial hemoglobins.  Could be a diverticular bleed or bleeding after polypectomy.  Colonoscopy for tomorrow.  If continues to hemorrhage can order a CT angio GI bleed versus bleeding scan.  Essential hypertension Blood pressure  medications on hold with GI bleed.  Check orthostatics.  History of stroke Affecting vision.  Hold Brilinta  with GI bleed.  Hyperlipidemia Not taking Lipitor .        Subjective: Patient stated he had a lot of bleeding.  Recently had a colonoscopy with polypectomy.  Did not bleeding after the polypectomy but just started bleeding yesterday.  Started as burgundy but then turned to bright red blood.  Physical Exam: Vitals:   06/22/24 2029 06/22/24 2212 06/23/24 0521 06/23/24 0757  BP: 108/73 115/80 107/69 112/76  Pulse: 84 63 84 75  Resp: 16 16 18 17   Temp: 98.4 F (36.9 C) 98.2 F (36.8 C) 98.3 F (36.8 C) 98.7 F (37.1 C)  TempSrc:  Oral  Oral  SpO2: 98% 98% 93% 94%  Weight:      Height:       Physical Exam HENT:     Head: Normocephalic.  Eyes:     General: Lids are normal.  Cardiovascular:     Rate and Rhythm: Normal rate and regular rhythm.     Heart sounds: Normal heart sounds, S1 normal and S2 normal.  Pulmonary:     Breath sounds: No decreased breath sounds, wheezing, rhonchi or rales.  Abdominal:     Palpations: Abdomen is soft.     Tenderness: There is no abdominal tenderness.  Musculoskeletal:     Right lower leg: No swelling.     Left lower leg: No swelling.  Skin:    General: Skin is warm.  Findings: No rash.  Neurological:     Mental Status: He is alert and oriented to person, place, and time.     Data Reviewed: Hemoglobin trend 11.9, 11.1, 10.5 and 10.3. Creatinine 1.12, electrolytes normal range  Family Communication: Spoke with wife on phone this morning  Disposition: Status is: Observation Holding Brilinta .  Watching for further bleeding.  Serial hemoglobins.  Colonoscopy to be done by gastroenterology tomorrow.  Planned Discharge Destination: Home    Time spent: 28 minutes  Author: Charlie Patterson, MD 06/23/2024 2:22 PM  For on call review [email or web address].  "

## 2024-06-23 NOTE — Assessment & Plan Note (Addendum)
 Affecting vision.  We held Brilinta  here.  Gastroenterology okay with going back on Brilinta  starting tomorrow.
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
## 2024-06-24 DIAGNOSIS — E785 Hyperlipidemia, unspecified: Secondary | ICD-10-CM | POA: Diagnosis not present

## 2024-06-24 DIAGNOSIS — Z8673 Personal history of transient ischemic attack (TIA), and cerebral infarction without residual deficits: Secondary | ICD-10-CM | POA: Diagnosis not present

## 2024-06-24 DIAGNOSIS — K922 Gastrointestinal hemorrhage, unspecified: Secondary | ICD-10-CM | POA: Diagnosis not present

## 2024-06-24 DIAGNOSIS — I10 Essential (primary) hypertension: Secondary | ICD-10-CM | POA: Diagnosis not present

## 2024-06-24 LAB — HEMOGLOBIN AND HEMATOCRIT, BLOOD
HCT: 28.9 % — ABNORMAL LOW (ref 39.0–52.0)
Hemoglobin: 9.5 g/dL — ABNORMAL LOW (ref 13.0–17.0)

## 2024-06-24 LAB — GLUCOSE, CAPILLARY: Glucose-Capillary: 91 mg/dL (ref 70–99)

## 2024-06-24 NOTE — Progress Notes (Signed)
 " Progress Note   Patient: Travis Chavez FMW:[id number] DOB: [birth year] DOA: 06/22/2024     1 DOS: the patient was seen and examined on 06/24/2024   Brief hospital course: 65 y.o. year old male with medical history of hypertension, hyperlipidemia, prediabetes, history of TIA presenting to the ED after having multiple episodes of blood in bowel movements.  States he did not have these yesterday.  Since this morning he has had 3 bowel movement with blood.  He called his gastroenterologist and they recommended he go to the ED for further evaluation.  Describes blood as bright red.  Patient states he had a colonoscopy earlier this month and polyp was removed.  Colonoscopy report reviewed and it showed multiple polyps which were removed along with patient having diverticulosis.  Patient was instructed regarding Brilinta  1 day later. On arrival to the ED patient was noted to be HDS stable.  Lab work obtained.  CBC showed hemoglobin decreased from baseline at 11.9.  There is hemoglobin greater than 14 in Care Everywhere, last year.  CMP grossly unremarkable without any BUN elevation. EDP discussed with GI who recommended admission for monitoring and consideration for repeat colonoscopy. Given this, [facility] contacted for admission.   11/17.  Hemoglobin this morning 10.3.  Patient seen this morning and did not have a bowel movement since 10 PM last night.  In speaking with patient's wife, the blood started off burgundy but then was bright red blood. 11/18.  Patient feeling better.  As per wife still had a little blood.  Colonoscopy for tomorrow.  Assessment and Plan: * Lower GI bleed Patient had a recent colonoscopy on 06/13/2024 removing a large polyp of 20 mm that was clipped and a 16 mm polyp.  Patient takes Brilinta  at home and this is now on hold.  Last dose was yesterday morning.  Serial hemoglobins.  Could be a diverticular bleed or bleeding after polypectomy.  Colonoscopy Monday.  Hemoglobin 9.5  Essential  hypertension Blood pressure medications on hold with GI bleed.  Not orthostatic  History of stroke Affecting vision.  Hold Brilinta  with GI bleed.  Hyperlipidemia Not taking Lipitor .        Subjective: Patient states the bleeding has almost subsided.  Patient feels okay.  Admitted with lower GI bleed.  Colonoscopy for tomorrow.  Recent polypectomy.  Still holding Brilinta   Physical Exam: Vitals:   06/24/24 0500 06/24/24 0833 06/24/24 0835 06/24/24 0838  BP:  125/79 126/85 124/89  Pulse:  64 70 78  Resp:      Temp:      TempSrc:      SpO2:  96% 100% 100%  Weight: 103 kg     Height:       Physical Exam HENT:     Head: Normocephalic.  Eyes:     General: Lids are normal.  Cardiovascular:     Rate and Rhythm: Normal rate and regular rhythm.     Heart sounds: Normal heart sounds, S1 normal and S2 normal.  Pulmonary:     Breath sounds: No decreased breath sounds, wheezing, rhonchi or rales.  Abdominal:     Palpations: Abdomen is soft.     Tenderness: There is no abdominal tenderness.  Musculoskeletal:     Right lower leg: No swelling.     Left lower leg: No swelling.  Skin:    General: Skin is warm.     Findings: No rash.  Neurological:     Mental Status: He is alert and oriented  to person, place, and time.     Data Reviewed: Hemoglobin 9.5  Family Communication: Wife at bedside  Disposition: Status is: Inpatient Remains inpatient appropriate because: Watch for bleeding.  Colonoscopy for tomorrow.  Planned Discharge Destination: Home    Time spent: 28 minutes  Author: Charlie Patterson, MD 06/24/2024 12:17 PM  For on call review [email or web address].  "

## 2024-06-24 NOTE — Plan of Care (Signed)
  Problem: Education: Goal: Knowledge of General Education information will improve Description: Including pain rating scale, medication(s)/side effects and non-pharmacologic comfort measures Outcome: Progressing   Problem: Clinical Measurements: Goal: Diagnostic test results will improve Outcome: Progressing   Problem: Activity: Goal: Risk for activity intolerance will decrease Outcome: Progressing   Problem: Nutrition: Goal: Adequate nutrition will be maintained Outcome: Progressing   Problem: Coping: Goal: Level of anxiety will decrease Outcome: Progressing   Problem: Safety: Goal: Ability to remain free from injury will improve Outcome: Progressing

## 2024-06-25 ENCOUNTER — Inpatient Hospital Stay: Admitting: Anesthesiology

## 2024-06-25 ENCOUNTER — Encounter: Admission: EM | Disposition: A | Payer: Self-pay | Source: Home / Self Care | Attending: Internal Medicine

## 2024-06-25 ENCOUNTER — Encounter: Payer: Self-pay | Admitting: Gastroenterology

## 2024-06-25 DIAGNOSIS — K633 Ulcer of intestine: Secondary | ICD-10-CM

## 2024-06-25 DIAGNOSIS — K922 Gastrointestinal hemorrhage, unspecified: Secondary | ICD-10-CM | POA: Diagnosis not present

## 2024-06-25 DIAGNOSIS — E785 Hyperlipidemia, unspecified: Secondary | ICD-10-CM | POA: Diagnosis not present

## 2024-06-25 DIAGNOSIS — I10 Essential (primary) hypertension: Secondary | ICD-10-CM | POA: Diagnosis not present

## 2024-06-25 DIAGNOSIS — Z8673 Personal history of transient ischemic attack (TIA), and cerebral infarction without residual deficits: Secondary | ICD-10-CM | POA: Diagnosis not present

## 2024-06-25 HISTORY — PX: HEMOSTASIS CLIP PLACEMENT: SHX6857

## 2024-06-25 HISTORY — PX: COLONOSCOPY: SHX5424

## 2024-06-25 LAB — BASIC METABOLIC PANEL WITH GFR
Anion gap: 8 (ref 5–15)
BUN: 7 mg/dL — ABNORMAL LOW (ref 8–23)
CO2: 25 mmol/L (ref 22–32)
Calcium: 8.3 mg/dL — ABNORMAL LOW (ref 8.9–10.3)
Chloride: 108 mmol/L (ref 98–111)
Creatinine, Ser: 0.9 mg/dL (ref 0.61–1.24)
GFR, Estimated: >60 mL/min
Glucose, Bld: 106 mg/dL — ABNORMAL HIGH (ref 70–99)
Potassium: 3.8 mmol/L (ref 3.5–5.1)
Sodium: 140 mmol/L (ref 135–145)

## 2024-06-25 LAB — CBC
HCT: 31.1 % — ABNORMAL LOW (ref 39.0–52.0)
Hemoglobin: 10.3 g/dL — ABNORMAL LOW (ref 13.0–17.0)
MCH: 28.9 pg (ref 26.0–34.0)
MCHC: 33.1 g/dL (ref 30.0–36.0)
MCV: 87.1 fL (ref 80.0–100.0)
Platelets: 281 K/uL (ref 150–400)
RBC: 3.57 MIL/uL — ABNORMAL LOW (ref 4.22–5.81)
RDW: 13.1 % (ref 11.5–15.5)
WBC: 8 K/uL (ref 4.0–10.5)
nRBC: 0 % (ref 0.0–0.2)

## 2024-06-25 LAB — GLUCOSE, CAPILLARY: Glucose-Capillary: 87 mg/dL (ref 70–99)

## 2024-06-25 MED ORDER — BRILINTA 90 MG PO TABS: 90.0000 mg | ORAL_TABLET | Freq: Two times a day (BID) | ORAL | Status: AC

## 2024-06-25 MED ORDER — IRON SUCROSE 300 MG IVPB - SIMPLE MED
300.0000 mg | Freq: Once | Status: AC
  Administered 2024-06-25: 300 mg via INTRAVENOUS
  Filled 2024-06-25: qty 300

## 2024-06-25 MED ORDER — STERILE WATER FOR IRRIGATION IR SOLN
Status: DC | PRN
  Administered 2024-06-25: 60 mL
  Administered 2024-06-25: 120 mL

## 2024-06-25 MED ORDER — DEXMEDETOMIDINE HCL IN NACL 80 MCG/20ML IV SOLN
INTRAVENOUS | Status: DC | PRN
  Administered 2024-06-25: 8 ug via INTRAVENOUS
  Administered 2024-06-25: 12 ug via INTRAVENOUS

## 2024-06-25 MED ORDER — LIDOCAINE HCL (CARDIAC) PF 100 MG/5ML IV SOSY
PREFILLED_SYRINGE | INTRAVENOUS | Status: DC | PRN
  Administered 2024-06-25: 80 mg via INTRAVENOUS

## 2024-06-25 MED ORDER — PROPOFOL 500 MG/50ML IV EMUL
INTRAVENOUS | Status: DC | PRN
  Administered 2024-06-25: 75 ug/kg/min via INTRAVENOUS

## 2024-06-25 MED ORDER — EPHEDRINE SULFATE-NACL 50-0.9 MG/10ML-% IV SOSY
PREFILLED_SYRINGE | INTRAVENOUS | Status: DC | PRN
  Administered 2024-06-25: 10 mg via INTRAVENOUS

## 2024-06-25 MED ORDER — PROPOFOL 10 MG/ML IV BOLUS
INTRAVENOUS | Status: DC | PRN
  Administered 2024-06-25 (×2): 50 mg via INTRAVENOUS

## 2024-06-25 NOTE — Plan of Care (Signed)
   Problem: Education: Goal: Knowledge of General Education information will improve Description Including pain rating scale, medication(s)/side effects and non-pharmacologic comfort measures Outcome: Progressing   Problem: Health Behavior/Discharge Planning: Goal: Ability to manage health-related needs will improve Outcome: Progressing

## 2024-06-25 NOTE — Anesthesia Postprocedure Evaluation (Signed)
"   Anesthesia Post Note  Patient: Travis Chavez  Procedure(s) Performed: COLONOSCOPY CONTROL OF HEMORRHAGE, GI TRACT, ENDOSCOPIC, BY CLIPPING OR OVERSEWING  Anesthesia Type: General Anesthetic complications: no   No notable events documented.   Last Vitals:  Vitals:   06/25/24 0837 06/25/24 0903  BP: 132/89 (!) 145/98  Pulse: 65 72  Resp: 18 20  Temp: 36.7 C (!) 36 C  SpO2: 99% 98%    Last Pain:  Vitals:   06/25/24 0903  TempSrc: Temporal  PainSc: (P) 0-No pain                 Stacie Channel H      "
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
## 2024-06-25 NOTE — Op Note (Signed)
 [facility] Gastroenterology Patient Name: Travis Chavez Procedure Date: 06/25/2024 9:06 AM MRN: [id number] Account #: [id number] Date of Birth: 12/30/1960 Admit Type: Inpatient Age: 64 Room: [facility] ENDO ROOM 1 Gender: Male Note Status: Finalized Instrument Name: Colon Scope [id number] Procedure:             Colonoscopy Indications:           Treatment of bleeding from polypectomy site Providers:             Ruel Kung MD, MD Referring MD:          Burnard CROME. Cobb (Referring MD) Medicines:             Monitored Anesthesia Care Complications:         No immediate complications. Procedure:             Pre-Anesthesia Assessment:                        - Prior to the procedure, a History and Physical was                         performed, and patient medications, allergies and                         sensitivities were reviewed. The patient's tolerance                         of previous anesthesia was reviewed.                        - The risks and benefits of the procedure and the                         sedation options and risks were discussed with the                         patient. All questions were answered and informed                         consent was obtained.                        - ASA Grade Assessment: II - A patient with mild                         systemic disease.                        After obtaining informed consent, the colonoscope was                         passed under direct vision. Throughout the procedure,                         the patient's blood pressure, pulse, and oxygen                         saturations were monitored continuously. The                         Colonoscope  was introduced through the anus and                         advanced to the the cecum, identified by the                         appendiceal orifice. The colonoscopy was performed                         with ease. The patient tolerated the procedure well.                          The quality of the bowel preparation was excellent.                         The ileocecal valve, appendiceal orifice, and rectum                         were photographed. Findings:      The perianal and digital rectal examinations were normal.      A single (solitary) thirteen mm ulcer was found in the cecum. No       bleeding was present. No stigmata of recent bleeding were seen. To       prevent bleeding post-intervention, three hemostatic clips were       successfully placed. Clip manufacturer: [employer]. There was no       bleeding during, or at the end, of the procedure. no bleeding seen , the       site of ulcer felt very firm and was hard to clip 4 clips were deployed       whereaqs only 3 were latched on - had to try a few attempts to get a       good grip oif tissue .      In remainder of colon no blood seen and site of previous polypectomy not       seen . old clip was also not seen      The exam was otherwise without abnormality on direct and retroflexion       views. Impression:            - A single (solitary) ulcer in the cecum. Clips were                         placed. Clip manufacturer: [employer].                        - The examination was otherwise normal on direct and                         retroflexion views.                        - No specimens collected. Recommendation:        - Discharge patient to home (with escort).                        - Resume previous diet.                        - Continue present medications.                        -  Restart Brillanta tomorrow - can go home today Procedure Code(s):     --- Professional ---                        [phone], Colonoscopy, flexible; diagnostic, including                         collection of specimen(s) by brushing or washing, when                         performed (separate procedure) Diagnosis Code(s):     --- Professional ---                        K63.3, Ulcer of  intestine                        K91.840, Postprocedural hemorrhage of a digestive                         system organ or structure following a digestive system                         procedure CPT copyright 2022 American Medical Association. All rights reserved. The codes documented in this report are preliminary and upon coder review may  be revised to meet current compliance requirements. Ruel Kung, MD Ruel Kung MD, MD 06/25/2024 9:37:09 AM This report has been signed electronically. Number of Addenda: 0 Note Initiated On: 06/25/2024 9:06 AM Scope Withdrawal Time: 0 hours 12 minutes 38 seconds  Total Procedure Duration: 0 hours 17 minutes 1 second  Estimated Blood Loss:  Estimated blood loss: none.      [facility]
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
## 2024-06-25 NOTE — Anesthesia Postprocedure Evaluation (Signed)
"   Anesthesia Post Note  Patient: Travis Chavez  Procedure(s) Performed: COLONOSCOPY CONTROL OF HEMORRHAGE, GI TRACT, ENDOSCOPIC, BY CLIPPING OR OVERSEWING  Patient location during evaluation: PACU Anesthesia Type: General Level of consciousness: awake and alert Pain management: pain level controlled Vital Signs Assessment: post-procedure vital signs reviewed and stable Respiratory status: spontaneous breathing, nonlabored ventilation, respiratory function stable and patient connected to nasal cannula oxygen Cardiovascular status: blood pressure returned to baseline and stable Postop Assessment: no apparent nausea or vomiting Anesthetic complications: no   No notable events documented.   Last Vitals:  Vitals:   06/25/24 0958 06/25/24 1031  BP: 110/64 114/67  Pulse: 81 75  Resp: 17   Temp:  36.6 C  SpO2: 96% 98%    Last Pain:  Vitals:   06/25/24 0958  TempSrc:   PainSc: 0-No pain                 Travis Chavez      "

## 2024-06-25 NOTE — Transfer of Care (Signed)
 Immediate Anesthesia Transfer of Care Note  Patient: Travis Chavez  Procedure(s) Performed: COLONOSCOPY CONTROL OF HEMORRHAGE, GI TRACT, ENDOSCOPIC, BY CLIPPING OR OVERSEWING  Patient Location: PACU  Anesthesia Type:General  Level of Consciousness: sedated  Airway & Oxygen Therapy: Patient Spontanous Breathing  Post-op Assessment: Report given to RN and Post -op Vital signs reviewed and stable  Post vital signs: Reviewed and stable  Last Vitals:  Vitals Value Taken Time  BP 120/66 06/25/24 09:37  Temp    Pulse 81 06/25/24 09:38  Resp 18 06/25/24 09:38  SpO2 97 % 06/25/24 09:38  Vitals shown include unfiled device data.  Last Pain:  Vitals:   06/25/24 0903  TempSrc: Temporal  PainSc: (P) 0-No pain      Patients Stated Pain Goal: 0 (06/22/24 2029)  Complications: No notable events documented.

## 2024-06-25 NOTE — Discharge Summary (Signed)
 " Physician Discharge Summary   Patient: Travis Chavez MRN: [id number] DOB: [birth year]  Admit date:     06/22/2024  Discharge date: 06/25/24  Discharge Physician: Charlie Patterson   PCP: Malachy Burnard Helling, FNP   Recommendations at discharge:   Follow-up PCP 5 days  Discharge Diagnoses: Principal Problem:   Lower GI bleed Active Problems:   Cecal ulcer   Essential hypertension   History of stroke   Osteoarthritis of left hip   Hyperlipidemia  Resolved Problems:   * No resolved hospital problems. *  Hospital Course: 64 y.o. year old male with medical history of hypertension, hyperlipidemia, prediabetes, history of TIA presenting to the ED after having multiple episodes of blood in bowel movements.  States he did not have these yesterday.  Since this morning he has had 3 bowel movement with blood.  He called his gastroenterologist and they recommended he go to the ED for further evaluation.  Describes blood as bright red.  Patient states he had a colonoscopy earlier this month and polyp was removed.  Colonoscopy report reviewed and it showed multiple polyps which were removed along with patient having diverticulosis.  Patient was instructed regarding Brilinta  1 day later. On arrival to the ED patient was noted to be HDS stable.  Lab work obtained.  CBC showed hemoglobin decreased from baseline at 11.9.  There is hemoglobin greater than 14 in Care Everywhere, last year.  CMP grossly unremarkable without any BUN elevation. EDP discussed with GI who recommended admission for monitoring and consideration for repeat colonoscopy. Given this, [facility] contacted for admission.   11/17.  Hemoglobin this morning 10.3.  Patient seen this morning and did not have a bowel movement since 10 PM last night.  In speaking with patient's wife, the blood started off burgundy but then was bright red blood. 11/18.  Patient feeling better.  As per wife still had a little blood.  Colonoscopy for tomorrow. 11/19.   Colonoscopy showing a ulcer in the cecal area.  Gastroenterology placed 3 clips.  Gastroenterology cleared to go home today and restart Brilinta  tomorrow.  Assessment and Plan: * Lower GI bleed Patient had a recent colonoscopy on 06/13/2024 removing a large polyp of 20 mm that was clipped and a 16 mm polyp.  Patient takes Brilinta  at home and this has been held in the hospital.  Colonoscopy today showing a cecal ulcer which gastroenterology placed 3 clips.  Not actively bleeding but had stigmata of recent bleeding.  Hemoglobin upon discharge 10.3.  IV Venofer  given.  Essential hypertension Blood pressure medications on hold with GI bleed.  Not orthostatic.  Stable for discharge home.  They have a blood pressure cuff at home and if blood pressure starts rising above 140/90 can restart Norvasc .  History of stroke Affecting vision.  We held Brilinta  here.  Gastroenterology okay with going back on Brilinta  starting tomorrow.  Hyperlipidemia Not taking Lipitor .         Consultants: Gastroenterology Procedures performed: Colonoscopy Disposition: Home Diet recommendation:  Cardiac diet DISCHARGE MEDICATION: Allergies as of 06/25/2024       Reactions   Beta Adrenergic Blockers Nausea And Vomiting   SOB        Medication List     STOP taking these medications    amLODipine  5 MG tablet Commonly known as: NORVASC    furosemide  20 MG tablet Commonly known as: LASIX    hydrALAZINE  25 MG tablet Commonly known as: APRESOLINE    hydrochlorothiazide  25 MG tablet Commonly known as:  HYDRODIURIL    potassium chloride  SA 20 MEQ tablet Commonly known as: KLOR-CON  M       TAKE these medications    atorvastatin  80 MG tablet Commonly known as: LIPITOR  Take 1 tablet (80 mg total) by mouth daily.   Brilinta  90 MG Tabs tablet Generic drug: ticagrelor  Take 1 tablet (90 mg total) by mouth 2 (two) times daily. Start taking on: June 26, 2024   cyanocobalamin  1000 MCG  tablet Commonly known as: VITAMIN B12 Take 1,000 mcg by mouth daily.   terazosin  2 MG capsule Commonly known as: HYTRIN  Take 2 mg by mouth 2 (two) times daily.   Vitamin D3 125 MCG (5000 UT) Tabs Take 1 tablet by mouth daily.        Follow-up Information     Cobb, Burnard Helling, FNP Follow up in 5 day(s).   Specialty: Family Medicine Contact information: [address] [place] [address] [phone]                Discharge Exam: [name]   06/22/24 1202 06/24/24 0500  Weight: 103.4 kg 103 kg   Physical Exam HENT:     Head: Normocephalic.  Eyes:     General: Lids are normal.  Cardiovascular:     Rate and Rhythm: Normal rate and regular rhythm.     Heart sounds: Normal heart sounds, S1 normal and S2 normal.  Pulmonary:     Breath sounds: No decreased breath sounds, wheezing, rhonchi or rales.  Abdominal:     Palpations: Abdomen is soft.     Tenderness: There is no abdominal tenderness.  Musculoskeletal:     Right lower leg: No swelling.     Left lower leg: No swelling.  Skin:    General: Skin is warm.     Findings: No rash.  Neurological:     Mental Status: He is alert and oriented to person, place, and time.      Condition at discharge: stable  The results of significant diagnostics from this hospitalization (including imaging, microbiology, ancillary and laboratory) are listed below for reference.   Imaging Studies: US  Venous Img Lower Unilateral Right (DVT) Result Date: 06/03/2024 CLINICAL DATA:  64 year old male with right lower extremity pain. EXAM: RIGHT LOWER EXTREMITY VENOUS DOPPLER ULTRASOUND TECHNIQUE: Gray-scale sonography with graded compression, as well as color Doppler and duplex ultrasound were performed to evaluate the right lower extremity deep venous systems from the level of the common femoral vein and including the common femoral, femoral, profunda femoral, popliteal and calf veins including the posterior  tibial, peroneal and gastrocnemius veins when visible. Spectral Doppler was utilized to evaluate flow at rest and with distal augmentation maneuvers in the common femoral, femoral and popliteal veins. The contralateral common femoral vein was also evaluated for comparison. COMPARISON:  None Available. FINDINGS: RIGHT LOWER EXTREMITY Common Femoral Vein: No evidence of thrombus. Normal compressibility, respiratory phasicity and response to augmentation. Central Greater Saphenous Vein: No evidence of thrombus. Normal compressibility and flow on color Doppler imaging. Central Profunda Femoral Vein: No evidence of thrombus. Normal compressibility and flow on color Doppler imaging. Femoral Vein: No evidence of thrombus. Normal compressibility, respiratory phasicity and response to augmentation. Popliteal Vein: No evidence of thrombus. Normal compressibility, respiratory phasicity and response to augmentation. Calf Veins: No evidence of thrombus. Normal compressibility and flow on color Doppler imaging. Other Findings: Moderately complex, avascular ovoid collection about the right popliteal fossa measuring up to approximately 4.7 cm with subjacent muscular edema  in the calf. LEFT LOWER EXTREMITY Common Femoral Vein: No evidence of thrombus. Normal compressibility, respiratory phasicity and response to augmentation. IMPRESSION: 1. No evidence of right lower extremity deep venous thrombosis. 2. Complex right baker cyst with evidence of possible rupture. Ester Sides, MD [facility] Electronically Signed   By: Ester Sides M.D.   On: 06/03/2024 15:27    Microbiology: Results for orders placed or performed during the hospital encounter of 04/27/20  Respiratory Panel by RT PCR (Flu A&B, Covid) - Nasopharyngeal Swab     Status: None   Collection Time: 04/29/20  4:54 AM   Specimen: Nasopharyngeal Swab; Nasopharyngeal(NP) swabs in vial transport medium  Result  Value Ref Range Status   SARS Coronavirus 2 by RT PCR NEGATIVE NEGATIVE Final    Comment: (NOTE) SARS-CoV-2 target nucleic acids are NOT DETECTED.  The SARS-CoV-2 RNA is generally detectable in upper respiratoy specimens during the acute phase of infection. The lowest concentration of SARS-CoV-2 viral copies this assay can detect is 131 copies/mL. A negative result does not preclude SARS-Cov-2 infection and should not be used as the sole basis for treatment or other patient management decisions. A negative result may occur with  improper specimen collection/handling, submission of specimen other than nasopharyngeal swab, presence of viral mutation(s) within the areas targeted by this assay, and inadequate number of viral copies (<131 copies/mL). A negative result must be combined with clinical observations, patient history, and epidemiological information. The expected result is Negative.  Fact Sheet for Patients:  https://www.moore.com/  Fact Sheet for Healthcare Providers:  https://www.young.biz/  This test is no t yet approved or cleared by the [place]  FDA and  has been authorized for detection and/or diagnosis of SARS-CoV-2 by FDA under an Emergency Use Authorization (EUA). This EUA will remain  in effect (meaning this test can be used) for the duration of the COVID-19 declaration under Section 564(b)(1) of the Act, 21 U.S.C. section 360bbb-3(b)(1), unless the authorization is terminated or revoked sooner.     Influenza A by PCR NEGATIVE NEGATIVE Final   Influenza B by PCR NEGATIVE NEGATIVE Final    Comment: (NOTE) The Xpert Xpress SARS-CoV-2/FLU/RSV assay is intended as an aid in  the diagnosis of influenza from Nasopharyngeal swab specimens and  should not be used as a sole basis for treatment. Nasal washings and  aspirates are unacceptable for Xpert Xpress SARS-CoV-2/FLU/RSV  testing.  Fact Sheet for  Patients: https://www.moore.com/  Fact Sheet for Healthcare Providers: https://www.young.biz/  This test is not yet approved or cleared by the [place]  FDA and  has been authorized for detection and/or diagnosis of SARS-CoV-2 by  FDA under an Emergency Use Authorization (EUA). This EUA will remain  in effect (meaning this test can be used) for the duration of the  Covid-19 declaration under Section 564(b)(1) of the Act, 21  U.S.C. section 360bbb-3(b)(1), unless the authorization is  terminated or revoked. Performed at [facility], [address] [place], [place] [address]     Labs: CBC: Recent Labs  Lab 06/22/24 1206 06/22/24 1940 06/22/24 2319 06/23/24 0537 06/23/24 2035 06/24/24 0633 06/25/24 1159  WBC 8.6  --  13.4*  --   --   --  8.0  HGB 11.9*   < > 10.5* 10.3* 9.7* 9.5* 10.3*  HCT 36.0*   < > 31.3* 30.6* 29.5* 28.9* 31.1*  MCV 87.6  --  85.8  --   --   --  87.1  PLT 296  --  277  --   --   --  281   < > = values in this interval not displayed.   Basic Metabolic Panel: Recent Labs  Lab 06/22/24 1206 06/22/24 1208 06/23/24 0537 06/25/24 1159  NA 141  --  141 140  K 3.5  --  3.6 3.8  CL 106  --  108 108  CO2 26  --  25 25  GLUCOSE 117*  --  113* 106*  BUN 17  --  17 7*  CREATININE 1.05  --  1.12 0.90  CALCIUM  9.0  --  8.7* 8.3*  MG  --  2.0  --   --    Liver Function Tests: Recent Labs  Lab 06/22/24 1206  AST 23  ALT 22  ALKPHOS 69  BILITOT 0.5  PROT 6.4*  ALBUMIN 4.0   CBG: Recent Labs  Lab 06/22/24 2254 06/23/24 0811 06/24/24 0836 06/25/24 0822  GLUCAP 121* 94 91 87    Discharge time spent: greater than 30 minutes.  Signed: Charlie Patterson, MD [facility] 06/25/2024 "

## 2024-06-25 NOTE — Discharge Instructions (Addendum)
 Gastroenterology team okay restarting brilinta  tomorrow  Take BP at home and can restart norvasc  if BP goes above 140/90  If bp still high after restarting norvasc  can restart hydrochlorothiazide  and potassium
